# Patient Record
Sex: Female | Born: 1975 | Race: Black or African American | Hispanic: No | Marital: Married | Smoking: Never smoker
Health system: Southern US, Community
[De-identification: ages and names within clinical notes are randomized; demographics above are authoritative.]

## PROBLEM LIST (undated history)

## (undated) DIAGNOSIS — G4730 Sleep apnea, unspecified: Secondary | ICD-10-CM

## (undated) DIAGNOSIS — D649 Anemia, unspecified: Secondary | ICD-10-CM

## (undated) DIAGNOSIS — K5900 Constipation, unspecified: Secondary | ICD-10-CM

## (undated) DIAGNOSIS — Z5189 Encounter for other specified aftercare: Secondary | ICD-10-CM

## (undated) DIAGNOSIS — F329 Major depressive disorder, single episode, unspecified: Secondary | ICD-10-CM

## (undated) DIAGNOSIS — E559 Vitamin D deficiency, unspecified: Secondary | ICD-10-CM

## (undated) DIAGNOSIS — G4700 Insomnia, unspecified: Secondary | ICD-10-CM

## (undated) DIAGNOSIS — I10 Essential (primary) hypertension: Secondary | ICD-10-CM

## (undated) DIAGNOSIS — N920 Excessive and frequent menstruation with regular cycle: Secondary | ICD-10-CM

## (undated) DIAGNOSIS — R7303 Prediabetes: Secondary | ICD-10-CM

## (undated) DIAGNOSIS — F32A Depression, unspecified: Secondary | ICD-10-CM

## (undated) DIAGNOSIS — K219 Gastro-esophageal reflux disease without esophagitis: Secondary | ICD-10-CM

## (undated) DIAGNOSIS — R000 Tachycardia, unspecified: Secondary | ICD-10-CM

## (undated) DIAGNOSIS — R12 Heartburn: Secondary | ICD-10-CM

## (undated) DIAGNOSIS — E7800 Pure hypercholesterolemia, unspecified: Secondary | ICD-10-CM

## (undated) DIAGNOSIS — E669 Obesity, unspecified: Secondary | ICD-10-CM

## (undated) DIAGNOSIS — I499 Cardiac arrhythmia, unspecified: Secondary | ICD-10-CM

## (undated) DIAGNOSIS — R5383 Other fatigue: Secondary | ICD-10-CM

## (undated) DIAGNOSIS — M2550 Pain in unspecified joint: Secondary | ICD-10-CM

## (undated) DIAGNOSIS — E049 Nontoxic goiter, unspecified: Secondary | ICD-10-CM

## (undated) HISTORY — DX: Depression, unspecified: F32.A

## (undated) HISTORY — DX: Tachycardia, unspecified: R00.0

## (undated) HISTORY — DX: Gastro-esophageal reflux disease without esophagitis: K21.9

## (undated) HISTORY — DX: Anemia, unspecified: D64.9

## (undated) HISTORY — DX: Constipation, unspecified: K59.00

## (undated) HISTORY — DX: Excessive and frequent menstruation with regular cycle: N92.0

## (undated) HISTORY — DX: Vitamin D deficiency, unspecified: E55.9

## (undated) HISTORY — DX: Obesity, unspecified: E66.9

## (undated) HISTORY — DX: Insomnia, unspecified: G47.00

## (undated) HISTORY — DX: Pure hypercholesterolemia, unspecified: E78.00

## (undated) HISTORY — DX: Nontoxic goiter, unspecified: E04.9

## (undated) HISTORY — DX: Prediabetes: R73.03

## (undated) HISTORY — DX: Heartburn: R12

## (undated) HISTORY — DX: Pain in unspecified joint: M25.50

## (undated) HISTORY — DX: Other fatigue: R53.83

## (undated) HISTORY — DX: Sleep apnea, unspecified: G47.30

## (undated) HISTORY — DX: Encounter for other specified aftercare: Z51.89

## (undated) HISTORY — DX: Major depressive disorder, single episode, unspecified: F32.9

## (undated) HISTORY — DX: Essential (primary) hypertension: I10

---

## 2000-05-14 HISTORY — PX: MYOMECTOMY: SHX85

## 2000-05-14 HISTORY — PX: LEFT OOPHORECTOMY: SHX1961

## 2000-05-31 ENCOUNTER — Other Ambulatory Visit: Admission: RE | Admit: 2000-05-31 | Discharge: 2000-05-31 | Payer: Self-pay | Admitting: Obstetrics and Gynecology

## 2000-10-16 ENCOUNTER — Encounter: Payer: Self-pay | Admitting: Obstetrics and Gynecology

## 2000-10-16 ENCOUNTER — Encounter: Admission: RE | Admit: 2000-10-16 | Discharge: 2000-10-16 | Payer: Self-pay | Admitting: Obstetrics and Gynecology

## 2000-10-24 ENCOUNTER — Inpatient Hospital Stay (HOSPITAL_COMMUNITY): Admission: RE | Admit: 2000-10-24 | Discharge: 2000-10-27 | Payer: Self-pay | Admitting: Obstetrics and Gynecology

## 2000-10-24 ENCOUNTER — Encounter (INDEPENDENT_AMBULATORY_CARE_PROVIDER_SITE_OTHER): Payer: Self-pay | Admitting: Specialist

## 2001-04-11 ENCOUNTER — Other Ambulatory Visit: Admission: RE | Admit: 2001-04-11 | Discharge: 2001-04-11 | Payer: Self-pay | Admitting: Obstetrics and Gynecology

## 2001-09-22 ENCOUNTER — Inpatient Hospital Stay (HOSPITAL_COMMUNITY): Admission: AD | Admit: 2001-09-22 | Discharge: 2001-09-22 | Payer: Self-pay | Admitting: Obstetrics and Gynecology

## 2001-09-23 ENCOUNTER — Inpatient Hospital Stay (HOSPITAL_COMMUNITY): Admission: AD | Admit: 2001-09-23 | Discharge: 2001-09-23 | Payer: Self-pay | Admitting: Obstetrics and Gynecology

## 2001-11-21 ENCOUNTER — Inpatient Hospital Stay (HOSPITAL_COMMUNITY): Admission: AD | Admit: 2001-11-21 | Discharge: 2001-11-26 | Payer: Self-pay | Admitting: Obstetrics and Gynecology

## 2001-11-21 ENCOUNTER — Encounter (INDEPENDENT_AMBULATORY_CARE_PROVIDER_SITE_OTHER): Payer: Self-pay | Admitting: *Deleted

## 2001-11-27 ENCOUNTER — Encounter: Admission: RE | Admit: 2001-11-27 | Discharge: 2001-12-27 | Payer: Self-pay | Admitting: Obstetrics and Gynecology

## 2001-12-19 ENCOUNTER — Other Ambulatory Visit: Admission: RE | Admit: 2001-12-19 | Discharge: 2001-12-19 | Payer: Self-pay | Admitting: Obstetrics and Gynecology

## 2001-12-28 ENCOUNTER — Encounter: Admission: RE | Admit: 2001-12-28 | Discharge: 2002-01-27 | Payer: Self-pay | Admitting: Obstetrics and Gynecology

## 2002-01-28 ENCOUNTER — Encounter: Admission: RE | Admit: 2002-01-28 | Discharge: 2002-02-27 | Payer: Self-pay | Admitting: Obstetrics and Gynecology

## 2002-09-29 ENCOUNTER — Encounter: Admission: RE | Admit: 2002-09-29 | Discharge: 2002-09-29 | Payer: Self-pay | Admitting: Internal Medicine

## 2002-12-14 ENCOUNTER — Encounter: Admission: RE | Admit: 2002-12-14 | Discharge: 2002-12-14 | Payer: Self-pay | Admitting: Internal Medicine

## 2003-04-13 ENCOUNTER — Encounter: Admission: RE | Admit: 2003-04-13 | Discharge: 2003-04-13 | Payer: Self-pay | Admitting: Internal Medicine

## 2003-04-22 ENCOUNTER — Other Ambulatory Visit: Admission: RE | Admit: 2003-04-22 | Discharge: 2003-04-22 | Payer: Self-pay | Admitting: Obstetrics and Gynecology

## 2003-06-14 ENCOUNTER — Encounter: Admission: RE | Admit: 2003-06-14 | Discharge: 2003-06-14 | Payer: Self-pay | Admitting: Internal Medicine

## 2003-07-16 ENCOUNTER — Encounter: Admission: RE | Admit: 2003-07-16 | Discharge: 2003-07-16 | Payer: Self-pay | Admitting: Internal Medicine

## 2003-12-07 ENCOUNTER — Ambulatory Visit: Admission: RE | Admit: 2003-12-07 | Discharge: 2003-12-07 | Payer: Self-pay | Admitting: Internal Medicine

## 2004-05-12 ENCOUNTER — Ambulatory Visit: Payer: Self-pay | Admitting: Internal Medicine

## 2004-05-14 DIAGNOSIS — E049 Nontoxic goiter, unspecified: Secondary | ICD-10-CM

## 2004-05-14 HISTORY — DX: Nontoxic goiter, unspecified: E04.9

## 2004-05-19 ENCOUNTER — Ambulatory Visit: Payer: Self-pay | Admitting: Internal Medicine

## 2004-05-22 ENCOUNTER — Ambulatory Visit: Payer: Self-pay | Admitting: Internal Medicine

## 2004-05-29 ENCOUNTER — Ambulatory Visit: Payer: Self-pay | Admitting: Internal Medicine

## 2004-09-07 ENCOUNTER — Encounter (INDEPENDENT_AMBULATORY_CARE_PROVIDER_SITE_OTHER): Payer: Self-pay | Admitting: Internal Medicine

## 2004-10-30 ENCOUNTER — Ambulatory Visit: Payer: Self-pay | Admitting: Internal Medicine

## 2005-01-25 ENCOUNTER — Inpatient Hospital Stay (HOSPITAL_COMMUNITY): Admission: AD | Admit: 2005-01-25 | Discharge: 2005-01-27 | Payer: Self-pay | Admitting: Obstetrics and Gynecology

## 2005-02-21 ENCOUNTER — Encounter: Admission: RE | Admit: 2005-02-21 | Discharge: 2005-02-21 | Payer: Self-pay | Admitting: Obstetrics and Gynecology

## 2006-03-19 ENCOUNTER — Ambulatory Visit: Payer: Self-pay | Admitting: Internal Medicine

## 2006-03-19 LAB — CONVERTED CEMR LAB
ALT: 17 units/L (ref 0–35)
AST: 17 units/L (ref 0–37)
Albumin: 4.4 g/dL (ref 3.5–5.2)
Alkaline Phosphatase: 86 units/L (ref 39–117)
BUN: 9 mg/dL (ref 6–23)
CO2: 24 meq/L (ref 19–32)
Calcium: 8.8 mg/dL (ref 8.4–10.5)
Chloride: 108 meq/L (ref 96–112)
Creatinine, Ser: 0.68 mg/dL (ref 0.40–1.20)
Glucose, Bld: 87 mg/dL (ref 70–99)
HCT: 41.1 % (ref 34.4–43.3)
Hemoglobin: 13.8 g/dL (ref 11.7–14.8)
Leukocyte count, blood: 6.3 10*9/L (ref 3.7–10.0)
MCHC: 33.6 g/dL (ref 33.1–35.4)
MCV: 84 fL (ref 78.8–100.0)
Platelets: 241 10*3/uL (ref 152–374)
Potassium: 3.9 meq/L (ref 3.5–5.3)
RBC: 4.89 M/uL (ref 3.79–4.96)
RDW: 13 % (ref 11.5–15.3)
Sodium: 139 meq/L (ref 135–145)
TSH: 0.876 microintl units/mL (ref 0.350–5.50)
Total Bilirubin: 0.3 mg/dL (ref 0.3–1.2)
Total Protein: 7.4 g/dL (ref 6.0–8.3)

## 2006-04-29 DIAGNOSIS — K219 Gastro-esophageal reflux disease without esophagitis: Secondary | ICD-10-CM | POA: Insufficient documentation

## 2006-04-29 DIAGNOSIS — F3289 Other specified depressive episodes: Secondary | ICD-10-CM | POA: Insufficient documentation

## 2006-04-29 DIAGNOSIS — F329 Major depressive disorder, single episode, unspecified: Secondary | ICD-10-CM | POA: Insufficient documentation

## 2006-05-26 DIAGNOSIS — D259 Leiomyoma of uterus, unspecified: Secondary | ICD-10-CM | POA: Insufficient documentation

## 2006-08-08 ENCOUNTER — Telehealth: Payer: Self-pay | Admitting: *Deleted

## 2006-10-01 ENCOUNTER — Ambulatory Visit: Payer: Self-pay | Admitting: Internal Medicine

## 2006-10-01 DIAGNOSIS — R0789 Other chest pain: Secondary | ICD-10-CM | POA: Insufficient documentation

## 2007-04-01 ENCOUNTER — Ambulatory Visit: Payer: Self-pay | Admitting: Internal Medicine

## 2007-04-01 DIAGNOSIS — G4700 Insomnia, unspecified: Secondary | ICD-10-CM | POA: Insufficient documentation

## 2007-08-27 ENCOUNTER — Ambulatory Visit: Payer: Self-pay | Admitting: Internal Medicine

## 2007-08-27 ENCOUNTER — Encounter (INDEPENDENT_AMBULATORY_CARE_PROVIDER_SITE_OTHER): Payer: Self-pay | Admitting: *Deleted

## 2007-08-27 LAB — CONVERTED CEMR LAB: Streptococcus, Group A Screen (Direct): POSITIVE — AB

## 2008-04-26 ENCOUNTER — Encounter (INDEPENDENT_AMBULATORY_CARE_PROVIDER_SITE_OTHER): Payer: Self-pay | Admitting: Internal Medicine

## 2008-07-05 ENCOUNTER — Ambulatory Visit: Payer: Self-pay | Admitting: Sports Medicine

## 2008-12-20 ENCOUNTER — Ambulatory Visit: Payer: Self-pay | Admitting: Internal Medicine

## 2008-12-20 LAB — CONVERTED CEMR LAB: Streptococcus, Group A Screen (Direct): POSITIVE — AB

## 2008-12-24 ENCOUNTER — Encounter (INDEPENDENT_AMBULATORY_CARE_PROVIDER_SITE_OTHER): Payer: Self-pay | Admitting: Internal Medicine

## 2008-12-24 ENCOUNTER — Ambulatory Visit: Payer: Self-pay | Admitting: Internal Medicine

## 2008-12-24 LAB — CONVERTED CEMR LAB
ALT: 19 units/L (ref 0–35)
AST: 21 units/L (ref 0–37)
Albumin: 3.5 g/dL (ref 3.5–5.2)
Alkaline Phosphatase: 46 units/L (ref 39–117)
BUN: 10 mg/dL (ref 6–23)
CO2: 24 meq/L (ref 19–32)
Calcium: 9.4 mg/dL (ref 8.4–10.5)
Chloride: 109 meq/L (ref 96–112)
Creatinine, Ser: 0.67 mg/dL (ref 0.40–1.20)
Glucose, Bld: 103 mg/dL — ABNORMAL HIGH (ref 70–99)
HCT: 41 % (ref 36.0–46.0)
Hemoglobin: 13.8 g/dL (ref 12.0–15.0)
MCHC: 33.7 g/dL (ref 30.0–36.0)
MCV: 88.1 fL (ref >=78.0)
Platelets: 252 10*3/uL (ref 150–400)
Potassium: 3.8 meq/L (ref 3.5–5.3)
RBC: 4.66 M/uL (ref 3.87–5.11)
RDW: 12.4 % (ref 11.5–15.5)
Sodium: 141 meq/L (ref 135–145)
TSH: 0.431 microintl units/mL (ref 0.350–4.5)
Total Bilirubin: 0.6 mg/dL (ref 0.3–1.2)
Total Protein: 6.9 g/dL (ref 6.0–8.3)
WBC: 5.6 10*3/uL (ref 4.0–10.5)

## 2008-12-27 ENCOUNTER — Encounter (INDEPENDENT_AMBULATORY_CARE_PROVIDER_SITE_OTHER): Payer: Self-pay | Admitting: Internal Medicine

## 2009-01-12 ENCOUNTER — Encounter (INDEPENDENT_AMBULATORY_CARE_PROVIDER_SITE_OTHER): Payer: Self-pay | Admitting: Internal Medicine

## 2009-08-02 ENCOUNTER — Encounter (INDEPENDENT_AMBULATORY_CARE_PROVIDER_SITE_OTHER): Payer: Self-pay | Admitting: Internal Medicine

## 2009-08-02 ENCOUNTER — Ambulatory Visit: Payer: Self-pay | Admitting: Internal Medicine

## 2009-08-02 LAB — CONVERTED CEMR LAB: Streptococcus, Group A Screen (Direct): NEGATIVE

## 2009-12-02 ENCOUNTER — Ambulatory Visit: Payer: Self-pay | Admitting: Family Medicine

## 2010-06-13 NOTE — Assessment & Plan Note (Signed)
Summary: 8:30,SHOULDER PAIN,MC   Vital Signs:  Patient profile:   35 year old female Weight:      161.50 pounds Pulse rate:   88 / minute BP sitting:   132 / 86  (right arm)  Vitals Entered By: Terese Door (December 02, 2009 8:28 AM) CC: right shoulder pain x 6 months Pain Assessment Patient in pain? yes     Location: shoulder Intensity: 3   CC:  right shoulder pain x 6 months.  History of Present Illness: 35 yo female with c/c of right shoulder pain.  Pain started about 6 mo ago, no injury or known trauma.  Pt endorses popping and clicking with overhead movement.  Pain is sharp and intermittent with activity.  No radiation, no numbness, no tingling, no weakness.  No recent change in activites.   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    Tried Ibuprofen with some relief.   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 Take 1/2 - 1 Tab By Mouth At Bedtime As Needed For Insomnia. 2)  Fluconazole 150 Mg Tabs (Fluconazole) .... Take 1 Tablet By Mouth Once A Day For 2 Days. 3)  Nexium 20 Mg Cpdr (Esomeprazole Magnesium) .... Take 1 Tablet By Mouth Once A Day  Allergies (verified): No Known Drug Allergies  Past History:  Past Medical History: Last updated: 08/27/2007 DEPRESSION GERD PLANTAR FASCIITIS (Dr. Charlsie Merles) LEIOMYOMA, UTERUS (ICD-218.9) - s/p myomectomy  OOPHORECTOMY, RIGHT, HX OF (ICD-V45.77) Strep pharyngitis  Past Surgical  History: Last updated: 04/29/2006 Caesarean section - Dr. Cherly Hensen (2003) Right Oophorectomy Myomectomy & R. oophorectomy - Dr. Cherly Hensen (2002)  Family History: Last updated: 10-17-06 Father: Deceased. Alcoholic Mother: Alive. Sever depressive episodes involving hospitalization for suicidal ideation. 1st episode  ~ age 6 Siblings: 1 brother with schizophrenia               1 brother with problems of substance abuse               1 sister with Stickler's syndrome Paternal uncles: undiagnosed mental illness (per patient)  Social History: Last updated: 17-Oct-2006 Marital Status: Married Children: 2 Occupation: CMA Never Smoked Alcohol use-yes (occasional) Drug use-no  Risk Factors: Smoking Status: never (10/17/2006)   Shoulder/Elbow Exam  General:    VS reviewed, Well-developed, well-nourished, normal body habitus; no deformities  Skin:    Intact, no scars or bruising.    Inspection:    Inspection is normal.    Vascular:    Radial, ulnar pulses 2+ and symmetric;  no evidence of ischemia or cyanosis.    Sensory:    Gross sensation intact in the upper extremities.    Shoulder Exam:    Right:    Inspection:  Normal    Palpation:  Abnormal       Location:  right AC joint    Stability:  stable    Tenderness:  right AC joint  Swelling:  no    Erythema:  no    Range of Motion:       Flexion-Active: 180       Extension-Active: 45       Flexion-Passive: 180       Extension-Passive: 45       External Rotation : 45       Interior Rotation : T7    Left:    Inspection:  Normal    Palpation:  Normal    Stability:  stable    Tenderness:  no    Swelling:  no    Erythema:  no    Range of Motion:       Flexion-Active: 180       Extension-Active: 45       Flexion-Passive: 180       Extension-Passive: 45       External Rotation : 45       Interior Rotation : T7    Pain with right cross-over and pain with empty can test on R  Tinel's:    Tinel's negative over   pronator, carpal, and Guyon's area.    Impingement Sign NEER:    Right negative; Left negative Impingement Sign HAWKINS:    Right negative; Left negative Apprehension Sign:    Right negative; Left negative Yerguson:    Right negative; Left negative Speeds:    Right negative; Left negative AC joint Adduction Test:    Right negative; Left negative   Impression & Recommendations:  Problem # 1:  ROTATOR CUFF SYNDROME, RIGHT (ICD-726.10) Assessment New No weakness noted, only pain. Discussed options and she chose HEP.  Exercises demonstarted and pt provided with instructions and thera band.  To complete 3 sets daily x 4 weeks. Ice as needed Ibuprofen as needed RTC in 4 weeks If worsening or no improvement may consider steroid injection, formal PT at next visit  Complete Medication List: 1)  Ambien 10 Mg Tabs (Zolpidem tartrate) .... Take 1/2 - 1 tab by mouth at bedtime as needed for insomnia. 2)  Fluconazole 150 Mg Tabs (Fluconazole) .... Take 1 tablet by mouth once a day for 2 days. 3)  Nexium 20 Mg Cpdr (Esomeprazole magnesium) .... Take 1 tablet by mouth once a day

## 2010-09-29 NOTE — Discharge Summary (Signed)
NAME:  Joy Contreras, Joy Contreras                      ACCOUNT NO.:  [id number]   MEDICAL RECORD NO.:  [id number]                   PATIENT TYPE:  INP   LOCATION:  9141                                 FACILITY:  [facility]   PHYSICIAN:  Sheronette A. Cherly Hensen, M.D.         DATE OF BIRTH:  June 07, 1975   DATE OF ADMISSION:  11/21/2001  DATE OF DISCHARGE:  11/26/2001                                 DISCHARGE SUMMARY   ADMISSION DIAGNOSES:  Post dates.   DISCHARGE DIAGNOSES:  Post dates, delivered, arrest of dilatation, presumed  chorioamnionitis.   PROCEDURE:  Primary cesarean section.   HISTORY OF PRESENT ILLNESS:  A 35 year old gravida 1, para 0 female at 12  and 3/7 weeks was admitted for induction of labor secondary to post dates.  The patient has history notable for recurrent urinary tract infections  during the pregnancy for which she has been placed on suppressive therapy.  Prenatal course is otherwise unremarkable.  Her blood type is B+.  Rubella  is immune.  Hepatitis B surface antigen is negative.  Group B Strep culture  was negative.   HOSPITAL COURSE:  The patient was admitted.  She was started on Pitocin  based on examination being __________, 80% effaced, -2 vertex presentation.  Pitocin was continued.  The patient had amniotomy at 1 cm, 70%, -2/-1  station.  Clear fluid was noted at the time.  She was contracting every 1-  1/2 to 4 minutes.  The patient had intrauterine pressure catheter placement.  She progressed to 8 cm, 100%, -1.  During her labor she developed a  temperature of 99.4 axillary and she was started on ampicillin and  gentamicin for presumed chorioamnionitis.  Her intrauterine pressure  catheter was replaced due to the undulating contractions that were noted.  She will remained unchanged after several hours with inability to further  obtain [place] units above/beyond 150 despite an attempt to increase  Pitocin.  Decision was made to proceed with a primary cesarean  section.  The  patient was taken to the operating room where she underwent a primary  cesarean section.  The findings were that of a large female in the left  occiput posterior presentation, Apgars of 8 and 9.  Placenta was manually  removed.  Normal right tube and ovary.  The left tube was normal.  The left  ovary was surgically absent.  Cord pH was 7.28.  An 8 pound 7 ounce baby.  The pathology on the placenta showed no significant evidence of  inflammation.  Her antibiotics were continued postoperatively.  Gentamicin  had been added while the patient was transferred to the operating room.  She  had antibiotics until she was afebrile for 48 hours.  Her CBC on  postoperative day number one showed a hemoglobin of 11.4, hematocrit 33.4,  white count 13.4.  By postoperative day number four the patient was  tolerating a regular diet.  She had passed flatus.  Incision  was without any  erythema, induration, or exudate.  She was deemed well to be discharged  home.   DISPOSITION:  Home.   CONDITION ON DISCHARGE:  Stable.   DISCHARGE MEDICATIONS:  1. Percocet one to two tablets q.3-4h. p.r.n. pain.  2. Multivitamins one p.o. q.d.    DISCHARGE INSTRUCTIONS:  Nothing per vagina for four to six weeks.  Call if  temperature greater or equal to 100.4.  Call if incisional drainage,  redness, or increased incisional pain, severe abdominal pain, nausea,  vomiting, soaking a regular pad every hour or more frequently.  No heavy  lifting or driving for two weeks.   FOLLOW UP:  Follow-up appointment is at four weeks at [facility].                                               Sheronette A. Cherly Hensen, M.D.    SAC/MEDQ  D:  12/29/2001  T:  12/30/2001  Job:  [id number]

## 2010-09-29 NOTE — Discharge Summary (Signed)
[facility]  Patient:    Joy Contreras, Joy Contreras                 MRN: [id number] Adm. Date:  [id number] Disc. Date: [id number] Attending:  Maxie Better                           Discharge Summary  ADMISSION DIAGNOSES:          1. Dyspareunia.                               2. Uterine fibroid.  DISCHARGE DIAGNOSES:          1. Left ovarian fibroma.                               2. Pedunculated uterine fibroids.  PROCEDURE:                    1. Exploratory laparotomy.                               2. Left oophorectomy.                               3. Myomectomy.  HISTORY OF PRESENT ILLNESS:   This is a 35 year old gravida 0, married black female admitted of myomectomy secondary to dyspareunia and uterine fibroid. The patient was been on oral contraceptive therapy for about 10 years. She has noted increased pain with intercourse for about six months duration with continuing even after intercourse. Ultrasound on May 31, 2000 revealed a large pedunculated posterior right fibroid measuring 12.5 cm. The left ovary was noted to be normal. The right ovary could not be identified due to the fibroid. Normal endometrial stripe.  HOSPITAL COURSE:              The patient was admitted. She was taken to the operating room where she underwent exploratory laparotomy. Findings at the time of surgery was a small uterus with pea-sized pedunculated fibroids, normal right tube and ovary, normal appendix, the left ovarian mass approximately 14 cm which was ultimately removed. Frozen section revealed a left ovarian fibroma, pending final pathology.  The patient had an uncomplicated postoperative course. Her bowels were initially sluggish, however, by postoperative day #3 the patient had a small bowel movement, passing flatus, tolerating a regular diet. She had remained afebrile throughout her hospital course. Her CBC on postoperative day #1 showed a hemoglobin  of 12.6, hematocrit of 37.3, white count of 9.0. On postoperative day #3, her staples were removed and Steri-Strips placed. The incision showed no evidence of erythema, induration, or exudate.  DISPOSITION:                  Home.  CONDITION:                    Stable.  DISCHARGE MEDICATIONS:        1. Motrin 800 mg, #30, one p.o. q.6h. p.r.n.                                  pain.  2. Tylox, #25, one to tablets p.o. q.3-4h.                                  p.r.n. pain.  DISCHARGE INSTRUCTIONS:       Call for temperature greater than or equal to 100.4, nothing per vagina for four to six weeks, no heavy lifting or driving for two weeks, call with increased incisional pain, redness or drainage from the incision site, severe abdominal pain, nausea, or vomiting.  FOLLOWUP APPOINTMENT:         The patient is to follow up in four weeks at [facility].DD:  11/14/00 TD:  11/14/00 Job: [id number]

## 2010-09-29 NOTE — Op Note (Signed)
[facility]  Patient:    Joy Contreras, Joy Contreras                 MRN: [id number] Proc. Date: 10/24/00 Adm. Date:  [id number] Attending:  Maxie Better                           Operative Report  PREOPERATIVE DIAGNOSES:       1. Dyspareunia.                               2. Uterine fibroids.  POSTOPERATIVE DIAGNOSES:      1. Pedunculated fibroid and left ovarian fibroma                                  pending final pathology.                               2. Dyspareunia.  PROCEDURES:                   1. Exploratory laparotomy.                               2. Left oophorectomy.                               3. Myomectomy.  SURGEON:                      Sheronette A. Cherly Hensen, M.D.  ASSISTANT:                    Genia Del, M.D.  ANESTHESIA:                   General.  INDICATIONS:                  This is a 35 year old gravida 0 married black female with a last menstrual period of October 23, 2000 admitted for myomectomy secondary to dyspareunia and uterine fibroid.  The patient had had ultrasound that revealed a 12.5 cm pedunculated fibroid.  She has been on birth control pills for approximately ten years.  The risks and benefits of the procedure were explained to the patient and consent was signed.  The patient was transferred to the operating room.  DESCRIPTION OF PROCEDURE:     Under general anesthesia, the patient was placed in the supine position.  Examination under anesthesia revealed a mass to the level of the umbilicus with a palpable right posterior firm mass.  The patient was thoroughly prepped and draped in the usual fashion.  A #10 French Foley catheter was introduced into the  bladder.  Pneumoboots were in place. Antibiotic prophylaxis was given.  A marking pen was used to outline a Pfannenstiel skin incision.  Marcaine 0.25% was injected along this incision line.  A scalpel was then used to perform a Pfannenstiel skin  incision and carry it down to the rectus fascia.  The rectus fascia was incised in the midline and extended bilaterally.  The rectus fascia was then bluntly and with cautery dissected off the rectus muscles, superior and inferior fascia.  The  rectus muscle was split in the midline.  The parietal peritoneum was entered bluntly and extended.  Exploration of the abdomen revealed normal appendix, normal liver edge, and normal palpable kidneys.  There was in the left aspect of the pelvis a large, irregular, firm mass.  The uterus was low in the pelvis.  Attempt at exteriorizing this mass (presumed pedunculated fibroid) was unsuccessful.  The incision on the skin was extended transversely and, again, attempted exteriorizing this mass was not successful.  At that point, further evaluation of the origin of the mass was performed and, at this point, it was noted that the uterus was a small structure.  The tube and ovary on the right were normal.  There were pea-sized pedunculated posterior fundal fibroids present.  This mass was actually the left ovary.  The left tube was otherwise normal.  When isolating the left utero-ovarian ligament and further delineating the superior aspect of this mass, it was noted that the mass was attached to the peritoneum superiorly.  To facilitate its removal, the left utero-ovarian ligaments were doubly clamped proximally and distally and then cut in the intervening segments.  The pedicles were then suture ligated with 0 Vicryl sutures.  Attention was then turned to the proximal attachment of this mass, which measured about 14 cm.  Metzenbaum scissors were then carefully used to initially do some dissection of the peritoneum off of the mass.  Kelly clamps were then used to clamp across the mass and to severe it from its proximal attachments.  The specimen was then sent as a left ovarian mass for frozen section.  The left pelvic side wall was inspected.  The  left retroperitoneal space was then opened and was then dissected to try to isolate the left ureter, which was subsequently identified after much attempt and was noted to be deep in the pelvis and peristalsing.  The proximal peritoneal areas were then closed with 0 Vicryl sutures.  Continued bleeding in that area necessitated additional suturing, with subsequent good hemostasis noted.  Care was taken to identify if the left ureter was anywhere near this structure, and it appeared not to have been that case.  Bleeding along the left utero-ovarian pedicle was clamped and suture ligated with 0 Vicryl suture.  Further inspection of this area revealed that there appeared to be still remnants of the left ovary.  This was removed and the pedicle was sutured with 0 Vicryl sutures.  Using the cautery, the fibroids on the posterior fundal area were removed.  One base from which the fibroid was removed continued bleeding despite cauterization.  Therefore, 2-0 Vicryl figure-of-eight suture was then used for hemostasis. The abdomen was then irrigated.  The posterior cul-de-sac was inspected.  No endometriotic implants were seen.  Reinspection of the areas from which the left ovarian mass had arisen was noted to be good hemostasis.  Irrigation and suction of debris was then obtained.  The parietal peritoneum was not closed.  The undersurface of the rectus fascia was inspected.  Small bleeders were cauterized.  The rectus muscle was inspected. No bleeding was noted.  The rectus fascia was then closed with 0 Vicryl sutures x 2.  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 Sponge and instrument counts x 2 were correct.  COMPLICATIONS:                None.  INTRAOPERATIVE FLUID:         2 L of crystalloid.  URINE OUTPUT:  300 cc of clear yellow urine.  ESTIMATED BLOOD LOSS:         250 cc.  SPECIMEN:                     Left ovary.  Fibroids x 2.  DISPOSITION:                   The patient tolerated the procedure well and was transferred to the recovery room in stable condition. DD:  10/24/00 TD:  10/25/00 Job: [id number]

## 2010-09-29 NOTE — H&P (Signed)
[facility]  Patient:    Joy Contreras, Joy Contreras                     MRN: [id number] Attending:  Nena Jordan A. Cherly Hensen, M.D.                         History and Physical  SCHEDULED ADMISSION DATE:     October 24, 2000.  CHIEF COMPLAINT:              Dyspareunia and uterine fibroid.  HISTORY OF PRESENT ILLNESS:   This is a 35 year old gravida 0, married black female, last menstrual period Sep 25, 2000, who is now being admitted for myomectomy secondary to dyspareunia and uterine fibroids. The patient has had regular cycles secondary to oral contraceptive use. She has been on the birth control pills for 10 years. She denies any family history of endometriosis. She has noticed decreased libido and the patient reports good relationship with her husband. The patient has been having pain with intercourse for about six months duration, and the pain will continue even after intercourse. Ultrasound performed on May 31, 2000 showed a large pedunculated posterior right fibroid measuring at its maximum diameter 12.5 cm. Her left ovary could be seen. The right ovary could not be identified due to the fibroid, and a normal endometrial stripe was noted at the time.  PAST MEDICAL HISTORY:  ALLERGIES:                    No known drug allergies.  MEDICINES:                    Ortho-Novum 7/7/7.  PAST MEDICAL HISTORY:         Negative.  PAST SURGICAL HISTORY:        Negative.  GYNECOLOGIC HISTORY:          In 1995, abnormal Pap smear, question HPV. No actual history of genital herpes.  FAMILY HISTORY:               Grandmother hypertension, heart disease. No genital, colon, or breast cancer.  SOCIAL HISTORY:               Married, nonsmoker, Engineer, site at [employer].  REVIEW OF SYSTEMS:            Negative except for the HPI. The patient has a long history of a right breast lump. Evaluation done at the [facility] on October 15, 2000 showed  consistency with a lymph node.  PHYSICAL EXAMINATION:  GENERAL:                      Well-developed, well-nourished black female in no acute distress.  VITAL SIGNS:                  Blood pressure 118/66, weight is 153 pounds.  SKIN:                         No lesions.  HEENT:                        Anicteric sclera, pink conjunctiva, oropharynx negative.  HEART:                        Regular rate and rhythm without murmur.  BREASTS:  Soft, nontender, left no palpable mass; right a pea-sized mass between 10 and 11 oclock; upward nipples without discharge bilaterally.  LUNGS:                        Clear to auscultation.  ABDOMEN:                      Soft. There is a nontender, palpable mass arising to the umbilicus.  PELVIC:                       Vulva showed no lesion. Vagina had no discharge. Cervix was closed, no lesion. Bimanual examination revealed a uterus of about 18-week size, irregular. Adnexa could not be appreciated secondary to enlarged uterus.  IMPRESSION:                   1. Dyspareunia, probably secondary to the                                  uterine fibroid.                               2. Right breast mass which is consistent with                                  right breast lymph node.  PLAN:                         1. Admission.                               2. Antibiotic prophylaxis.                               3. Exploratory laparotomy.                               4. Myomectomy.  Risk and benefit of the procedure have been explained to the patient and her husband including, but not limited to, infection, bleeding, possible need for myomectomy and/or hysterectomy in the future up to about 30% chance, possible need for cesarean section for future deliveries, internal scar tissue which can cause possibly pelvic pain or bowel obstruction, and wound infection. The patient will be off her birth control pills while  recuperating. DD:  10/22/00 TD:  10/22/00 Job: [id number]

## 2010-09-29 NOTE — H&P (Signed)
[facility]  Patient:    Joy Contreras, Joy Contreras Visit Number: [id number] MRN: [id number]          Service Type: OBS Location: 910B 9174 01 Attending Physician:  Maxie Better Dictated by:   Sheria Lang. Cherly Hensen, M.D. Admit Date:  11/21/2001                           History and Physical  CHIEF COMPLAINT:              Induction of labor, post dates.  HISTORY OF PRESENT ILLNESS:   This is a 35 year old gravida 1 para 0 female, unknown LMP, with an EDC of November 18, 2001 by ultrasound done on April 08, 2001 at eight weeks, who is now at 88 and three-sevenths weeks gestation admitted for induction of labor secondary to post dates.  The patient has not had contractions.  She has had good fetal movements.  Her prenatal course has been remarkable for recurrent urinary tract infection for which she subsequently was placed on suppressive therapy.  Her history is notable for cryosurgery in 1993.  On exam on November 19, 2001 she was noted to have a dimple-sized opening of her cervix, 80% effaced, -2, vertex presentation. Ultrasound on November 19, 2001 showed good amniotic fluid index, estimated fetal weight 8 pounds 9 ounces which was at the 71st percentile, grade 3 placenta. Prenatal care is at [facility], primary obstetrician Dr. Maxie Better.  PRENATAL LABORATORY DATA:     Blood type B positive, antibody screen negative. Hemoglobin electrophoresis negative.  RPR nonreactive.  Rubella immune. Hepatitis B surface antigen negative.  HIV test nonreactive.  GC and chlamydia cultures were negative.  Pap was normal in November 2002.  E. coli UTI on November 29, Enterococcus UTI subsequently.  AFP3 test was normal.  Normal anatomic fetal survey on August 05, 2001 at 21.5 weeks.  Group B strep culture was negative.  One-hour GCT was normal.  PAST MEDICAL HISTORY:  ALLERGIES:                    No known drug allergies.  MEDICATIONS:                   Prenatal vitamins and Macrobid.  MEDICAL HISTORY:              Negative.  SURGICAL HISTORY:             Left oophorectomy and myomectomy in June 2002. Cryosurgery in 1993.  FAMILY HISTORY:               Father - diabetes, seizures.  MI and strokes in paternal grandmother and maternal grandmother.  Her sister was born with Stickler, which is a genetic disorder.  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  REVIEW OF SYSTEMS:            Negative except as noted in the history of present illness.  PHYSICAL EXAMINATION:  GENERAL:                      Well-developed, gravid black female in no acute distress.  VITAL SIGNS:                  Blood pressure 104/60, weight 175 pounds, fetal heart rate 142.  SKIN:  Shows no lesions.  HEENT:                        Anicteric sclerae, pink conjunctivae, oropharynx negative.  HEART:                        Regular rate and rhythm without murmur.  LUNGS:                        Clear to auscultation.  BREASTS:                      Soft, nontender, no palpable mass.  ABDOMEN:                      Gravid.  Fundal height 39 cm.  Low transverse scar noted.  PELVIC:                       Dimple, 80%, -2.  EXTREMITIES:                  No edema.  IMPRESSION:                   Post dates.  PLAN:                         1. Admission.                               2. Pitocin induction.                               3. Routine admission labs.                               4. Epidural greater than or equal to 4 cm p.r.n.                                  pain.                               5. Analgesics p.r.n. pain.  NOTE:                         The myomectomy done on June 2002 did not enter the endometrial cavity and therefore is not of concern at this time.Dictated by:   Sheria Lang. Cherly Hensen, M.D. Attending Physician:  Maxie Better DD:  11/20/01 TD:  11/21/01 Job:  [id number]

## 2010-09-29 NOTE — Op Note (Signed)
[facility]  Patient:    Joy Contreras, Joy Contreras Visit Number: [id number] MRN: [id number]          Service Type: OBS Location: 910A 9141 01 Attending Physician:  Maxie Better Dictated by:   Sheria Lang. Cherly Hensen, M.D. Proc. Date: 11/22/01 Admit Date:  11/21/2001                             Operative Report  PREOPERATIVE DIAGNOSES:       1. Arrest of dilatation.                               2. Presumed chorioamnionitis.                               3. Post dates.  POSTOPERATIVE DIAGNOSES:      1. Direct occiput posterior presentation.                               2. Arrest of dilatation.                               3. Presumed chorioamnionitis.                               4. Post dates.  OPERATION:                    Primary cesarean section, Kerr hysterotomy.  SURGEON:                      Sheronette A. Cherly Hensen, M.D.  ANESTHESIA:                   Epidural.  SPECIMEN:                     Placenta, sent to pathology.  ESTIMATED BLOOD LOSS:         600 cc.  INTRAOPERATIVE FLUID:         2 L.  URINE OUTPUT:                 300 cc clear yellow urine.  COMPLICATIONS:                None.  INDICATIONS:                  This is a 35 year old gravida 1, para 0, female at 82+ weeks gestation, admitted on November 21, 2001, for induction of labor secondary to post dates.  Her history is notable for cryosurgery.  She has had an uncomplicated pregnancy.  The patient had Pitocin induction, subsequently had artificial rupture of membranes and dilatation of her cervix, which ultimately progressed to 8 cm and arrested at 8 cm for several hours.  The patient developed a temperature during the course of her labor that was thought to be presumptive of diagnosis of chorioamnionitis, and for which she was started on ampicillin and gentamicin.  Intrauterine pressure has been in place.  The fetal position was felt to be left occiput anterior  presentation. The patient had neither descent or further dilatation, and the decision was therefore made  to proceed with the primary cesarean section.  The risks and benefits of the procedure had been explained to the patient.  Consent was signed.  The patient was transferred to the operating room.  DESCRIPTION OF PROCEDURE:     Under adequate epidural anesthesia, the patient was placed in the supine position with a left lateral tilt.  An indwelling Foley catheter had been in place prior to transfer.  The patient was sterilely prepped and draped in the usual fashion.  Approximately 8 cc of 0.25% Marcaine was injected along the previous low transverse skin incision, and a scalpel was then used to outline the previous scar, carried down to the rectus fascia using Bovie cautery. The rectus fascia was incised in the midline and extended bilaterally using the Bovie cautery and Mayo scissors.  The rectus fascia was then bluntly and with cautery dissected off the rectus muscles in a superior and inferior fashion.  In the dissection of the upper fascial area, the parietal peritoneum was opened and extended.  The bladder peritoneum was then opened and extended bilaterally.  The bladder was then bluntly dissected off the lower uterine segment and displaced on the operative field using a Doyen retractor.  A curvilinear low transverse uterine incision was then made, extended bilaterally using bandage scissors.  Subsequent delivery of a live female infant from the left occiput posterior presentation was accomplished. The baby was bulb-suctioned on the abdomen and the cord was clamped and cut. The baby was transferred to the waiting pediatricians, who assigned Apgars of 8 and 9 at one and five minutes.  Cord pH was obtained and was 7.28.  The placenta was manually removed.  The uterine cavity was cleaned of debris.  The uterine incision was then inspected.  There was a right inferior extension of the  angled area of the uterine incision.  The uterine incision was able to be closed with 0 Monocryl running locked stitch along the entire incision line. A second layer of 0 Monocryl imbricated sutures were then placed.  Good hemostasis was noted.  The abdomen was copiously irrigated, suctioned of debris.  The left tube was normal.  The left ovary was surgically absent.  The right tube and ovary were normal.  The parietal peritoneum and the vesicouterine peritoneum were now closed.  The undersurface of the rectus fascia and the rectus muscle was inspected, no bleeders noted.  The rectus fascia was closed with 0 Vicryl sutures x2.  The skin previous scar was partially removed and the subcutaneous area was irrigated, small bleeders cauterized, and the skin approximated using Ethicon staples.  Weight of the baby was 8 pounds 7 ounces.  Sponge and instrument counts x2 were correct. The patient tolerated the procedure well and was transferred to the recovery room in stable condition. Dictated by:   Sheria Lang. Cherly Hensen, M.D. Attending Physician:  Maxie Better DD:  11/22/01 TD:  11/25/01 Job: [id number]

## 2010-11-08 ENCOUNTER — Ambulatory Visit: Payer: Self-pay | Admitting: Internal Medicine

## 2011-10-15 ENCOUNTER — Encounter: Payer: Self-pay | Admitting: Internal Medicine

## 2011-10-15 DIAGNOSIS — E049 Nontoxic goiter, unspecified: Secondary | ICD-10-CM | POA: Insufficient documentation

## 2011-10-16 ENCOUNTER — Encounter: Payer: Self-pay | Admitting: Internal Medicine

## 2011-10-16 ENCOUNTER — Ambulatory Visit (INDEPENDENT_AMBULATORY_CARE_PROVIDER_SITE_OTHER): Payer: 59 | Admitting: Internal Medicine

## 2011-10-16 VITALS — BP 134/87 | HR 100 | Temp 99.0°F | Ht 63.0 in | Wt 175.8 lb

## 2011-10-16 DIAGNOSIS — R0789 Other chest pain: Secondary | ICD-10-CM

## 2011-10-16 DIAGNOSIS — R000 Tachycardia, unspecified: Secondary | ICD-10-CM | POA: Insufficient documentation

## 2011-10-16 DIAGNOSIS — K219 Gastro-esophageal reflux disease without esophagitis: Secondary | ICD-10-CM

## 2011-10-16 DIAGNOSIS — R5383 Other fatigue: Secondary | ICD-10-CM | POA: Insufficient documentation

## 2011-10-16 DIAGNOSIS — F3289 Other specified depressive episodes: Secondary | ICD-10-CM

## 2011-10-16 DIAGNOSIS — R5381 Other malaise: Secondary | ICD-10-CM

## 2011-10-16 DIAGNOSIS — F329 Major depressive disorder, single episode, unspecified: Secondary | ICD-10-CM

## 2011-10-16 DIAGNOSIS — N920 Excessive and frequent menstruation with regular cycle: Secondary | ICD-10-CM | POA: Insufficient documentation

## 2011-10-16 DIAGNOSIS — E049 Nontoxic goiter, unspecified: Secondary | ICD-10-CM

## 2011-10-16 LAB — COMPLETE METABOLIC PANEL WITH GFR
ALT: 13 U/L (ref 0–35)
Albumin: 3.9 g/dL (ref 3.5–5.2)
BUN: 9 mg/dL (ref 6–23)
CO2: 22 mEq/L (ref 19–32)
Calcium: 8.8 mg/dL (ref 8.4–10.5)
Chloride: 107 mEq/L (ref 96–112)
GFR, Est African American: >89 mL/min
GFR, Est Non African American: >89 mL/min
Glucose, Bld: 87 mg/dL (ref 70–99)
Sodium: 137 mEq/L (ref 135–145)
Total Bilirubin: 0.2 mg/dL — ABNORMAL LOW (ref 0.3–1.2)
Total Protein: 6.9 g/dL (ref 6.0–8.3)

## 2011-10-16 LAB — LIPID PANEL: HDL: 60 mg/dL (ref >39)

## 2011-10-16 LAB — CBC
HCT: 39.3 % (ref 36.0–46.0)
MCV: 83.4 fL (ref 78.0–100.0)
RBC: 4.71 MIL/uL (ref 3.87–5.11)
WBC: 6.1 10*3/uL (ref 4.0–10.5)

## 2011-10-16 LAB — FERRITIN: Ferritin: 6 ng/mL — ABNORMAL LOW (ref 10–291)

## 2011-10-16 MED ORDER — AMITRIPTYLINE HCL 25 MG PO TABS: ORAL_TABLET | ORAL | Status: DC

## 2011-10-16 MED ORDER — ESOMEPRAZOLE MAGNESIUM 20 MG PO CPDR: 20.0000 mg | DELAYED_RELEASE_CAPSULE | Freq: Every day | ORAL | Status: DC

## 2011-10-16 NOTE — Assessment & Plan Note (Signed)
Seeing GYN for W/U and tx. Will check CBC and ferritin.

## 2011-10-16 NOTE — Assessment & Plan Note (Signed)
Had been on Protonix in past and did well. Sxs have recurred. Reflux into throat, esp at night bc of increased PO intake in evenings. Weight has also increased which could increase sxs. Will start Nexium 20 mg QD for 6 weeks and then stop and see if sxs have resolved.

## 2011-10-16 NOTE — Progress Notes (Signed)
  Subjective:    Patient ID: Joy Contreras, female    DOB: 05-14-1976, 36 y.o.   MRN: [id number]  HPI  Please see the A&P for the status of the pt's chronic medical problems.  Review of Systems  Constitutional: Positive for fatigue and unexpected weight change. Negative for activity change and appetite change.  Respiratory: Positive for chest tightness. Negative for cough, choking and shortness of breath.   Cardiovascular: Positive for chest pain and palpitations. Negative for leg swelling.  Gastrointestinal: Negative for nausea, vomiting and diarrhea.  Genitourinary: Positive for menstrual problem.  Musculoskeletal: Positive for myalgias and arthralgias.  Neurological: Negative for dizziness and light-headedness.  Psychiatric/Behavioral: Positive for sleep disturbance and dysphoric mood.       Objective:   Physical Exam  Constitutional: She is oriented to person, place, and time. She appears well-developed and well-nourished. No distress.  HENT:  Head: Normocephalic and atraumatic.  Right Ear: External ear normal.  Left Ear: External ear normal.  Nose: Nose normal.  Eyes: Conjunctivae are normal.  Neck: Normal range of motion. Neck supple. Thyromegaly present.  Cardiovascular: Normal rate, regular rhythm and normal heart sounds.   Pulmonary/Chest: Effort normal and breath sounds normal. No respiratory distress.  Musculoskeletal: She exhibits no edema.  Lymphadenopathy:    She has no cervical adenopathy.  Neurological: She is alert and oriented to person, place, and time.  Skin: Skin is warm and dry. No rash noted. She is not diaphoretic. No erythema. No pallor.  Psychiatric: She has a normal mood and affect. Her behavior is normal. Judgment and thought content normal.          Assessment & Plan:

## 2011-10-16 NOTE — Assessment & Plan Note (Signed)
HR at rest 100-130. Was Rx Metoprolol while pregnant 2/2 increased HR and did well on it. Some palpitations. Occasional stabbing chest pain L upper chest without radiation. Had ECHO is past but not in EPIC. One or two cups of coffee in AM no other caffeine intake. No tobacco. On OCP but no personal or fam hx of VTE. Concerned about long term effects of tachycardia on heart. There is a risk of tachycardia induced cardiomyopathy.   Will start with TSH and if nl, will resume BB since BP has been trending up.

## 2011-10-16 NOTE — Assessment & Plan Note (Signed)
Dr Altheimer's note indicated that at high risk for hyperthyroidism. Only sxs is tachycardia and palpitations. Will check TSH.

## 2011-10-16 NOTE — Assessment & Plan Note (Signed)
See A&P for depression

## 2011-10-16 NOTE — Assessment & Plan Note (Addendum)
Has had depression in past but wants to try to hold off on anti-depressants for now. Depression in past was feeling of not being able to provide for family. That sxs is present now but the overwhelming sxs are fatigue, tiredness, increased food intake in evenings and insomnia.   Insomnia - can't fall asleep until midnight of 1 AM. Can stay asleep. Gets about 5 hours of sleep a night. Occasional nap on weekends but does not sleep in in the mornings. Hard to get out of bed in AM but then OK except when comes home and crashes on couch. Has tried ETOH once or twice and gets asleep but poor quality. Tried cold meds and gets asleep but groggy next day. Tried Ambien but groggy next day. Will try Elavil 25 mg QHS and increase to 50 if needed.   Basic labs to see if other reason for fatigue.

## 2012-01-15 ENCOUNTER — Ambulatory Visit: Payer: 59 | Admitting: Internal Medicine

## 2012-01-23 ENCOUNTER — Other Ambulatory Visit: Payer: Self-pay | Admitting: *Deleted

## 2012-01-23 DIAGNOSIS — R5383 Other fatigue: Secondary | ICD-10-CM

## 2012-01-23 MED ORDER — AMITRIPTYLINE HCL 50 MG PO TABS: 50.0000 mg | ORAL_TABLET | Freq: Every day | ORAL | Status: DC

## 2012-01-23 NOTE — Telephone Encounter (Signed)
Please increase to 50 mg tablets and order a 3 month supply.

## 2012-05-04 ENCOUNTER — Encounter (HOSPITAL_COMMUNITY): Payer: Self-pay

## 2012-05-04 ENCOUNTER — Emergency Department (HOSPITAL_COMMUNITY)
Admission: EM | Admit: 2012-05-04 | Discharge: 2012-05-04 | Disposition: A | Discharge status: Home / Self Care | Payer: 59 | Source: Home / Self Care

## 2012-05-04 DIAGNOSIS — J111 Influenza due to unidentified influenza virus with other respiratory manifestations: Secondary | ICD-10-CM

## 2012-05-04 MED ORDER — HYDROCOD POLST-CHLORPHEN POLST 10-8 MG/5ML PO LQCR: 5.0000 mL | Freq: Two times a day (BID) | ORAL | Status: DC | PRN

## 2012-05-04 MED ORDER — ACETAMINOPHEN 500 MG PO TABS
1000.0000 mg | ORAL_TABLET | Freq: Once | ORAL | Status: AC
  Administered 2012-05-04: 1000 mg via ORAL

## 2012-05-04 MED ORDER — ACETAMINOPHEN 325 MG PO TABS
ORAL_TABLET | ORAL | Status: AC
  Filled 2012-05-04: qty 3

## 2012-05-04 MED ORDER — ONDANSETRON HCL 4 MG PO TABS: 4.0000 mg | ORAL_TABLET | Freq: Four times a day (QID) | ORAL | Status: DC

## 2012-05-04 NOTE — ED Notes (Signed)
C/o not felt well past few days w HA, fatigue, cough, ST; had flu shot

## 2012-05-04 NOTE — ED Provider Notes (Signed)
History     CSN: [id number]  Arrival date & time 05/04/12  1135   None     Chief Complaint  Patient presents with  . Fever    (Consider location/radiation/quality/duration/timing/severity/associated sxs/prior treatment) HPI Comments: 36 year old female who presents with 2 days of cough, fatigue, sore throat, headache, nausea without vomiting and myalgias and malaise. She received a flu shot in October. She did not realize she had a fever of 102.6 until she arrived here. She admits that she has not been drinking fluids over the past 24 hours or more. He feels as though she may be a little dehydrated.    Past Medical History  Diagnosis Date  . Goiter 2006    Dr Altheimer. Dx with Autonomously functioning goiter during pregnancy. anti-TPO negative.   . Depression     History of Depression  . Menorrhagia     Being tx by GYN  . GERD (gastroesophageal reflux disease)     Tx with PPI  . Tachycardia     Required BB during pregnancy. Baseline 100-130 at rest    Past Surgical History  Procedure Date  . 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 Cesarean section 11/22/2001  . 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 Seizures Father   . Hypertension Mother   . Mental illness Brother   . Stickler syndrome Sister   . Bipolar disorder Mother   . Schizophrenia Brother   . ADD / ADHD Son     History  Substance Use Topics  . Smoking status: Never Smoker   . Smokeless tobacco: Not on file  . Alcohol Use: No    OB History    Grav Para Term Preterm Abortions TAB SAB Ect Mult Living                  Review of Systems  Constitutional: Positive for fever, activity change, appetite change and fatigue.  HENT:       See history of present illness  Respiratory: Positive for cough. Negative for shortness of breath and stridor.   Cardiovascular: Positive for palpitations.  Gastrointestinal:  Positive for nausea. Negative for vomiting.  Genitourinary: Negative.   Musculoskeletal: Negative.   Skin: Negative.   Neurological: Negative.   Psychiatric/Behavioral: Negative.     Allergies  Review of patient's allergies indicates no known allergies.  Home Medications   Current Outpatient Rx  Name  Route  Sig  Dispense  Refill  . AMITRIPTYLINE HCL 50 MG PO TABS   Oral   Take 1 tablet (50 mg total) by mouth at bedtime.   90 tablet   3   . HYDROCOD POLST-CPM POLST ER 10-8 MG/5ML PO LQCR   Oral   Take 5 mLs by mouth every 12 (twelve) hours as needed.   90 mL   0   . ESOMEPRAZOLE MAGNESIUM 20 MG PO CPDR   Oral   Take 1 capsule (20 mg total) by mouth daily.   90 capsule   3   . ORTHO-NOVUM 7/7/7 (28) PO   Oral   Take 1 tablet by mouth daily.         Marland Kitchen ONDANSETRON HCL 4 MG PO TABS   Oral   Take 1 tablet (4 mg total) by mouth every 6 (six) hours.   12 tablet   0     BP 141/90  Pulse 140  Temp 102.6 F (39.2 C) (Oral)  Resp 20  SpO2 100%  Physical Exam  Nursing note and vitals reviewed. Constitutional: She appears well-developed and well-nourished. No distress.       She is sitting up the edge of the exam table, conversing well with energetic speech. Cognition is intact. She appears mildly ill but not toxic.  HENT:       Bilateral TMs are without erythema, bulging. Oropharynx with minor posterior pharyngeal injection but no exudates or swelling.  Eyes: Conjunctivae normal and EOM are normal.  Neck: Normal range of motion. Neck supple.  Cardiovascular:       Tachycardia but no abnormal heart sounds.  Pulmonary/Chest: Effort normal and breath sounds normal. No respiratory distress. She has no wheezes. She has no rales.  Musculoskeletal: Normal range of motion. She exhibits no edema.  Lymphadenopathy:    She has no cervical adenopathy.  Neurological: She is alert. No cranial nerve deficit. She exhibits normal muscle tone.  Skin: Skin is warm and dry. No  rash noted.  Psychiatric: She has a normal mood and affect.    ED Course  Procedures (including critical care time)   Labs Reviewed  POCT RAPID STREP A ([facility] ONLY)   No results found.   1. Influenza-like illness       MDM   Results for orders placed during the hospital encounter of 05/04/12  POCT RAPID STREP A ([facility] ONLY)      Component Value Range   Streptococcus, Group A Screen (Direct) NEGATIVE  NEGATIVE   Although she does appear mildly ill she does not appear toxic. Zofran 4 mg by mouth every 6 hours when necessary nausea. Drink plenty of fluids and stay well hydrated Tussionex 1 teaspoon every 8 hours when necessary cough headache and myalgias. Stay home for the next 3 days, rest or for any worsening new symptoms or problems go to the emergency department. Her heart rate is noted at 140. I suspect that she is tachycardic due to fever and mild dehydration. She is encouraged to drink plenty of fluids and take ibuprofen every 6 hours and/or Tylenol every 4 hours for fever management. Should she fail to defervesce or develop chest pain, shortness of breath or other symptoms as stated above to go to the emergency department. She has no history of cardiac problems.          Hayden Rasmussen, NP 05/04/12 1404

## 2012-05-04 NOTE — ED Provider Notes (Signed)
Medical screening examination/treatment/procedure(s) were performed by non-physician practitioner and as supervising physician I was immediately available for consultation/collaboration.  Leslee Home, M.D.   Reuben Likes, MD 05/04/12 2017

## 2012-06-20 ENCOUNTER — Other Ambulatory Visit: Payer: 59

## 2012-06-20 ENCOUNTER — Other Ambulatory Visit: Payer: Self-pay | Admitting: Internal Medicine

## 2012-06-20 DIAGNOSIS — B950 Streptococcus, group A, as the cause of diseases classified elsewhere: Secondary | ICD-10-CM

## 2012-06-20 DIAGNOSIS — R070 Pain in throat: Secondary | ICD-10-CM

## 2012-06-20 LAB — RAPID STREP SCREEN (MED CTR MEBANE ONLY)

## 2012-06-20 MED ORDER — PENICILLIN V POTASSIUM 500 MG PO TABS: 500.0000 mg | ORAL_TABLET | Freq: Two times a day (BID) | ORAL | Status: DC

## 2012-06-24 ENCOUNTER — Ambulatory Visit: Payer: 59 | Admitting: Internal Medicine

## 2012-06-25 ENCOUNTER — Other Ambulatory Visit: Payer: Self-pay | Admitting: Internal Medicine

## 2012-06-25 DIAGNOSIS — R5383 Other fatigue: Secondary | ICD-10-CM

## 2012-06-25 DIAGNOSIS — R053 Chronic cough: Secondary | ICD-10-CM

## 2012-06-25 DIAGNOSIS — R05 Cough: Secondary | ICD-10-CM

## 2012-06-25 MED ORDER — AMITRIPTYLINE HCL 100 MG PO TABS: 100.0000 mg | ORAL_TABLET | Freq: Every day | ORAL | Status: DC

## 2012-06-30 ENCOUNTER — Other Ambulatory Visit: Payer: Self-pay | Admitting: Internal Medicine

## 2012-06-30 ENCOUNTER — Ambulatory Visit (HOSPITAL_COMMUNITY)
Admission: RE | Admit: 2012-06-30 | Discharge: 2012-06-30 | Disposition: A | Discharge status: Home / Self Care | Payer: 59 | Source: Ambulatory Visit | Attending: Internal Medicine | Admitting: Internal Medicine

## 2012-06-30 DIAGNOSIS — R05 Cough: Secondary | ICD-10-CM | POA: Insufficient documentation

## 2012-06-30 DIAGNOSIS — R053 Chronic cough: Secondary | ICD-10-CM

## 2012-06-30 DIAGNOSIS — R059 Cough, unspecified: Secondary | ICD-10-CM | POA: Insufficient documentation

## 2012-06-30 IMAGING — CR DG CHEST 2V
2 series · 2 of 2 positions shown · non-contrast
Comparison: None

CLINICAL DATA: Chronic cough

CHEST - 2 VIEW

[w chest pa]
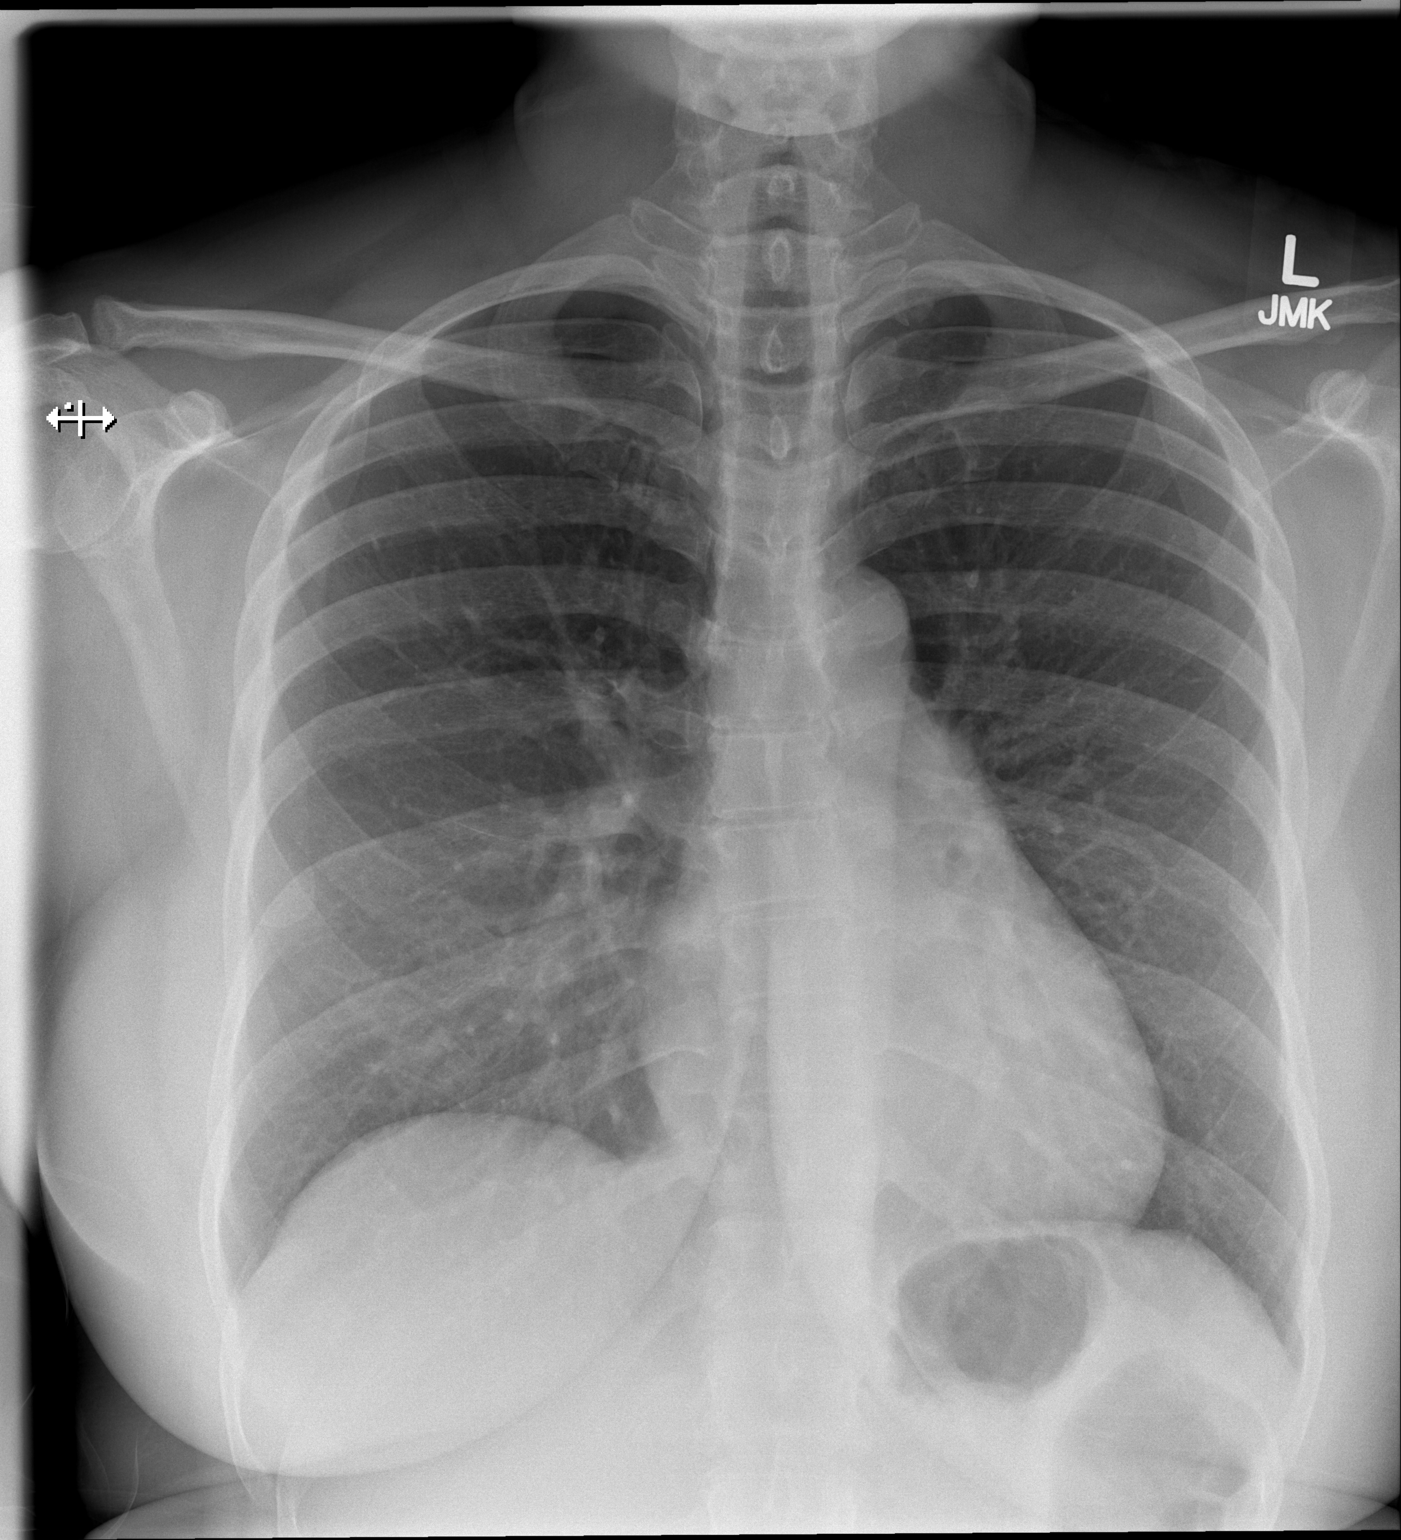

[w chest lat]
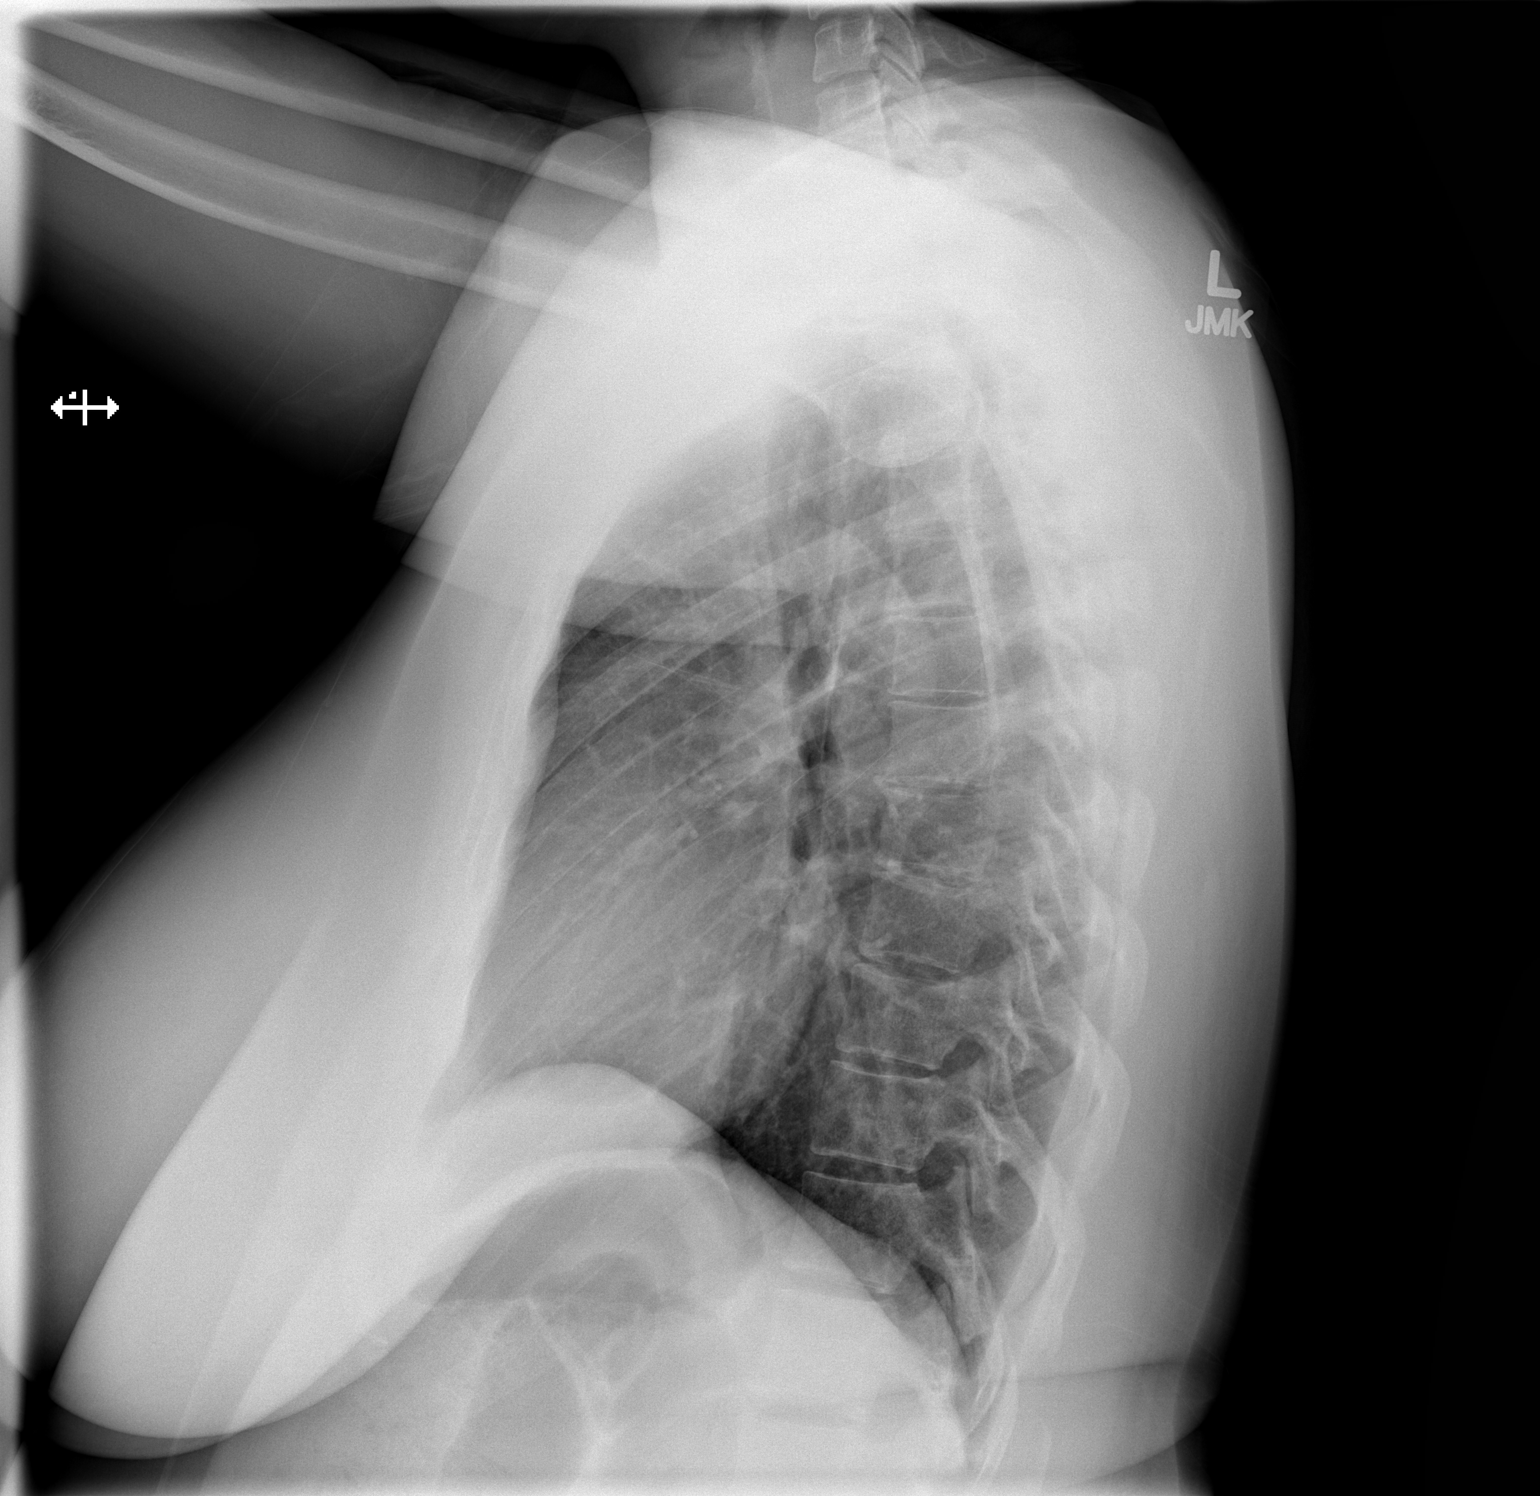

[2 of 2 positions shown; findings below may reference images not displayed]

FINDINGS: Heart size is normal and the vascularity is normal.
Lungs are clear without infiltrate or effusion.  Negative for mass
lesion.
IMPRESSION: Negative

## 2012-06-30 MED ORDER — METOPROLOL SUCCINATE ER 25 MG PO TB24: 25.0000 mg | ORAL_TABLET | Freq: Every day | ORAL | Status: DC

## 2012-06-30 NOTE — Progress Notes (Signed)
Tachy for yrs. On Toprol 25 while pregnant. HR 1teens to 130's. Has D/C'd caffeine. TSH OK. Not anemic. Trial of Toprol 25.  CXR 2/2 cough for 3 months. No GERD / PND.   Trial of increased elavil.

## 2012-08-08 ENCOUNTER — Telehealth: Payer: Self-pay | Admitting: *Deleted

## 2012-08-08 NOTE — Telephone Encounter (Signed)
error 

## 2012-09-03 ENCOUNTER — Other Ambulatory Visit: Payer: Self-pay | Admitting: *Deleted

## 2012-09-03 MED ORDER — METOPROLOL SUCCINATE ER 25 MG PO TB24: 25.0000 mg | ORAL_TABLET | Freq: Every day | ORAL | Status: DC

## 2012-09-03 NOTE — Telephone Encounter (Signed)
Pt request a 90 day supply

## 2012-09-15 ENCOUNTER — Encounter: Payer: Self-pay | Admitting: Internal Medicine

## 2012-10-17 ENCOUNTER — Other Ambulatory Visit (INDEPENDENT_AMBULATORY_CARE_PROVIDER_SITE_OTHER): Payer: 59

## 2012-10-17 ENCOUNTER — Telehealth: Payer: Self-pay | Admitting: *Deleted

## 2012-10-17 DIAGNOSIS — D509 Iron deficiency anemia, unspecified: Secondary | ICD-10-CM

## 2012-10-17 LAB — FERRITIN: Ferritin: 18 ng/mL (ref 10–291)

## 2012-10-17 LAB — CBC
Hemoglobin: 11.8 g/dL — ABNORMAL LOW (ref 12.0–15.0)
MCV: 83.9 fL (ref 78.0–100.0)
RBC: 4.28 MIL/uL (ref 3.87–5.11)

## 2012-10-17 NOTE — Telephone Encounter (Signed)
I have put in the blood draw orders. I will F/U the results next week. I did not sch lab appt though. Thanks

## 2012-10-17 NOTE — Telephone Encounter (Signed)
Pt calls to state that she has been on her menses for about 12 weeks. Her gyn is aware of this and will have her return to the office for a transvaginal ultrasound once her periods stops. In the meantime, she is requesting a ferritin and/or hemoglobin level. Please advise.  Pt # [id number]

## 2012-10-17 NOTE — Telephone Encounter (Signed)
She is scheduled and will have blood drawn today.

## 2012-10-17 NOTE — Addendum Note (Signed)
Addended by: Bufford Spikes on: 10/17/2012 03:05 PM   Modules accepted: Orders

## 2013-02-05 ENCOUNTER — Other Ambulatory Visit: Payer: Self-pay | Admitting: *Deleted

## 2013-02-05 MED ORDER — HYDROCOD POLST-CHLORPHEN POLST 10-8 MG/5ML PO LQCR: 5.0000 mL | Freq: Two times a day (BID) | ORAL | Status: DC | PRN

## 2013-02-05 NOTE — Telephone Encounter (Signed)
Rx called in 

## 2013-02-05 NOTE — Telephone Encounter (Signed)
Patient has had cough for 1 week and request cough med.  OTC meds not helping.   Denies fever, cough is non productive.

## 2013-03-03 ENCOUNTER — Ambulatory Visit (INDEPENDENT_AMBULATORY_CARE_PROVIDER_SITE_OTHER): Payer: 59 | Admitting: Internal Medicine

## 2013-03-03 ENCOUNTER — Encounter: Payer: Self-pay | Admitting: Internal Medicine

## 2013-03-03 VITALS — BP 140/90 | HR 108 | Temp 98.2°F | Ht 64.5 in | Wt 185.4 lb

## 2013-03-03 DIAGNOSIS — R5383 Other fatigue: Secondary | ICD-10-CM

## 2013-03-03 DIAGNOSIS — R059 Cough, unspecified: Secondary | ICD-10-CM

## 2013-03-03 DIAGNOSIS — R05 Cough: Secondary | ICD-10-CM | POA: Insufficient documentation

## 2013-03-03 DIAGNOSIS — E049 Nontoxic goiter, unspecified: Secondary | ICD-10-CM

## 2013-03-03 DIAGNOSIS — K219 Gastro-esophageal reflux disease without esophagitis: Secondary | ICD-10-CM

## 2013-03-03 DIAGNOSIS — R000 Tachycardia, unspecified: Secondary | ICD-10-CM

## 2013-03-03 DIAGNOSIS — N920 Excessive and frequent menstruation with regular cycle: Secondary | ICD-10-CM

## 2013-03-03 DIAGNOSIS — R5381 Other malaise: Secondary | ICD-10-CM

## 2013-03-03 MED ORDER — METOPROLOL SUCCINATE ER 50 MG PO TB24: 50.0000 mg | ORAL_TABLET | Freq: Every day | ORAL | Status: DC

## 2013-03-03 MED ORDER — AMITRIPTYLINE HCL 100 MG PO TABS: 100.0000 mg | ORAL_TABLET | Freq: Every day | ORAL | Status: DC

## 2013-03-03 MED ORDER — PANTOPRAZOLE SODIUM 20 MG PO TBEC: 20.0000 mg | DELAYED_RELEASE_TABLET | Freq: Every day | ORAL | Status: DC

## 2013-03-03 NOTE — Progress Notes (Signed)
  Subjective:    Patient ID: Joy Contreras, female    DOB: 05-24-1975, 37 y.o.   MRN: [id number]  Cough Associated symptoms include a sore throat. Pertinent negatives include no chest pain, fever, rhinorrhea or shortness of breath.    37 yo with c/o non productive nagging cough for about 6 weeks. There were no other sxs representative of viral URI at sx onset. Did have rib cage pain with the coughing but that has resolved. Cough day and night. Walking to car at end of day triggers cough bnut not walking from car into work in AM. Has now developed stuffy nose past week but daughter is also sick. No h/o asthma, no tobacco use or exposure, no fever, no allergies or allergic sxs. No travel. Tried Tussionex and helped but caused constipation. Has tried Mucinex, Nyquil, Dayquil OTC. Had been off PPI 6-7 months but restarted about 2 weeks ago but taking intermittently. No SOB but increased breathing when walks far.   Review of Systems  Constitutional: Negative for fever, activity change, appetite change and unexpected weight change.  HENT: Positive for sore throat. Negative for congestion, rhinorrhea, sinus pressure and trouble swallowing.   Eyes: Negative for itching.  Respiratory: Positive for cough. Negative for shortness of breath.   Cardiovascular: Positive for leg swelling. Negative for chest pain.  Gastrointestinal: Negative for constipation.  Genitourinary: Positive for menstrual problem.  Musculoskeletal: Negative for arthralgias.  Skin: Negative for color change.  Psychiatric/Behavioral: Negative for sleep disturbance.       Objective:   Physical Exam  Constitutional: She is oriented to person, place, and time. She appears well-developed and well-nourished. No distress.  HENT:  Head: Normocephalic and atraumatic.  Right Ear: External ear normal.  Nose: Nose normal.  Post oropharynx erythematous no exudate no tonsillar enlargement  Eyes: Conjunctivae and EOM are normal. Pupils  are equal, round, and reactive to light.  Neck: Normal range of motion. Neck supple. Thyromegaly present.  R lobes, no nodules  Cardiovascular: Regular rhythm and normal heart sounds.   tachy  Pulmonary/Chest: Effort normal and breath sounds normal. No respiratory distress. She has no wheezes.  Musculoskeletal: Normal range of motion. She exhibits edema. She exhibits no tenderness.  Lymphadenopathy:    She has no cervical adenopathy.  Neurological: She is oriented to person, place, and time.  Skin: Skin is warm and dry. No rash noted. She is not diaphoretic. No erythema. No pallor.  Psychiatric: She has a normal mood and affect. Her behavior is normal. Judgment and thought content normal.          Assessment & Plan:

## 2013-03-03 NOTE — Assessment & Plan Note (Signed)
HR still elevated. BP can tolerate increased BB. Purnell Shoemaker asked to tincrease Toprol from 25 to 50 and there is no CI to doing this. Sent in year supply.

## 2013-03-03 NOTE — Assessment & Plan Note (Signed)
Sleeping improved on Elavil 100. Doesn't help to get to sleep but helps to stay asleep. If takes too late at nights gets groggy next AM.

## 2013-03-03 NOTE — Assessment & Plan Note (Signed)
Gyn has rec TAH but wants to wait until next year. Gyn has reordered iron and will start to take once better. HgB has decreased from 11.8 to 10.

## 2013-03-03 NOTE — Patient Instructions (Signed)
I sent in elavil 100, pantoprozole 20, and toprol 50 to the pharmacy Try Benedryl QHS, the pantoprozole, and tussionex (with colace) for cough. Let me know if no better. I will get you your TSH result

## 2013-03-03 NOTE — Assessment & Plan Note (Signed)
TSH nl 10/2011. No sig sxs but still with R lobe enlargement. Check TSH.

## 2013-03-03 NOTE — Assessment & Plan Note (Signed)
Had same thing in Jan Feb 2014 and resolved by itself. No trigger with this episode that started it. CXR in Feb 2014 showed no tracheal impingement by thyroid so will not repeat sine thyroid size unchanged. No h/o asthma and lung exam OK today and no exercise induced sxs consistently so doubt asthma induced cough. GERD and just the persistent cough cycle likely. So, tussionex with colace, benedryl, and PPI. If no better, consider trial of albuterol.

## 2013-03-03 NOTE — Assessment & Plan Note (Signed)
No longer on Nexium. [employer], [employer]. Does get GERD with severe sxs and with specific foods. Change to Protonix.

## 2013-03-04 LAB — TSH: TSH: 0.559 u[IU]/mL (ref 0.350–4.500)

## 2013-03-12 ENCOUNTER — Telehealth: Payer: Self-pay | Admitting: *Deleted

## 2013-03-12 NOTE — Telephone Encounter (Signed)
Pt came into my office asking for an antibiotic for her symptoms.  She has stated  her cough is now productive, and states chest tenderness when she coughs. Denies fever, chills or SOB.  She was seen in clinic 10/21 for cough,cold and fatigue.  She has tried OTC cough med as reviewed at last visit. Onset of cold symptoms 7 weeks ago.  She wants to try antibiotic, she is not requesting a chest X-ray.

## 2013-03-13 MED ORDER — AZITHROMYCIN 250 MG PO TABS: ORAL_TABLET | ORAL | Status: AC

## 2013-03-13 NOTE — Telephone Encounter (Signed)
Pt informed

## 2013-03-13 NOTE — Telephone Encounter (Signed)
Azithro sent in. If copay too $$, pls let me know.

## 2013-03-19 ENCOUNTER — Other Ambulatory Visit: Payer: Self-pay

## 2013-06-04 ENCOUNTER — Ambulatory Visit: Payer: 59 | Admitting: Internal Medicine

## 2013-07-17 ENCOUNTER — Other Ambulatory Visit: Payer: Self-pay | Admitting: Obstetrics and Gynecology

## 2013-07-21 ENCOUNTER — Ambulatory Visit (HOSPITAL_COMMUNITY)
Admission: RE | Admit: 2013-07-21 | Discharge: 2013-07-21 | Disposition: A | Discharge status: Home / Self Care | Payer: 59 | Source: Ambulatory Visit | Attending: Obstetrics and Gynecology | Admitting: Obstetrics and Gynecology

## 2013-07-21 DIAGNOSIS — D509 Iron deficiency anemia, unspecified: Secondary | ICD-10-CM | POA: Insufficient documentation

## 2013-07-21 MED ORDER — FERUMOXYTOL INJECTION 510 MG/17 ML
1020.0000 mg | Freq: Once | INTRAVENOUS | Status: AC
  Administered 2013-07-21: 1020 mg via INTRAVENOUS
  Filled 2013-07-21: qty 34

## 2013-10-14 ENCOUNTER — Encounter (HOSPITAL_COMMUNITY): Admission: RE | Payer: Self-pay | Source: Ambulatory Visit

## 2013-10-14 ENCOUNTER — Ambulatory Visit (HOSPITAL_COMMUNITY): Admission: RE | Admit: 2013-10-14 | Payer: 59 | Source: Ambulatory Visit | Admitting: Obstetrics and Gynecology

## 2013-10-14 SURGERY — ROBOTIC ASSISTED TOTAL HYSTERECTOMY
Anesthesia: General

## 2014-02-16 ENCOUNTER — Encounter: Payer: Self-pay | Admitting: Internal Medicine

## 2014-02-16 ENCOUNTER — Ambulatory Visit (INDEPENDENT_AMBULATORY_CARE_PROVIDER_SITE_OTHER): Payer: 59 | Admitting: Internal Medicine

## 2014-02-16 VITALS — BP 138/88

## 2014-02-16 DIAGNOSIS — H8309 Labyrinthitis, unspecified ear: Secondary | ICD-10-CM

## 2014-02-16 DIAGNOSIS — N921 Excessive and frequent menstruation with irregular cycle: Secondary | ICD-10-CM

## 2014-02-16 DIAGNOSIS — E049 Nontoxic goiter, unspecified: Secondary | ICD-10-CM

## 2014-02-16 LAB — POCT URINALYSIS DIPSTICK
BILIRUBIN UA: NEGATIVE
Glucose, UA: NEGATIVE
KETONES UA: NEGATIVE
Leukocytes, UA: NEGATIVE
Nitrite, UA: NEGATIVE
PH UA: 7
PROTEIN UA: NEGATIVE
SPEC GRAV UA: 1.015
Urobilinogen, UA: 0.2

## 2014-02-16 LAB — COMPLETE METABOLIC PANEL WITH GFR
ALK PHOS: 65 U/L (ref 39–117)
ALT: 18 U/L (ref 0–35)
AST: 25 U/L (ref 0–37)
Albumin: 3.4 g/dL — ABNORMAL LOW (ref 3.5–5.2)
BILIRUBIN TOTAL: 0.2 mg/dL — AB (ref 0.3–1.2)
BUN: 8 mg/dL (ref 6–23)
CO2: 25 meq/L (ref 19–32)
Calcium: 9 mg/dL (ref 8.4–10.5)
Chloride: 107 mEq/L (ref 96–112)
Creat: 0.75 mg/dL (ref 0.50–1.10)
GFR, Est Non African American: >89 mL/min
Glucose, Bld: 81 mg/dL (ref 70–99)
Potassium: 4.6 mEq/L (ref 3.5–5.3)
SODIUM: 144 meq/L (ref 135–145)
TOTAL PROTEIN: 7.4 g/dL (ref 6.0–8.3)

## 2014-02-16 LAB — CBC WITH DIFFERENTIAL/PLATELET
Basophils Absolute: 0.1 10*3/uL (ref 0.0–0.1)
Basophils Relative: 1 % (ref 0–1)
EOS ABS: 0.1 10*3/uL (ref 0.0–0.7)
Eosinophils Relative: 1 % (ref 0–5)
HCT: 39.6 % (ref 36.0–46.0)
Hemoglobin: 13.2 g/dL (ref 12.0–15.0)
LYMPHS ABS: 2.3 10*3/uL (ref 0.7–4.0)
Lymphocytes Relative: 39 % (ref 12–46)
MCH: 29.3 pg (ref 26.0–34.0)
MCHC: 33.3 g/dL (ref 30.0–36.0)
MCV: 87.8 fL (ref 78.0–100.0)
Monocytes Absolute: 0.4 10*3/uL (ref 0.1–1.0)
Monocytes Relative: 7 % (ref 3–12)
NEUTROS PCT: 52 % (ref 43–77)
Neutro Abs: 3.1 10*3/uL (ref 1.7–7.7)
PLATELETS: 315 10*3/uL (ref 150–400)
RBC: 4.51 MIL/uL (ref 3.87–5.11)
RDW: 13.5 % (ref 11.5–15.5)
WBC: 5.9 10*3/uL (ref 4.0–10.5)

## 2014-02-16 LAB — TSH: TSH: 1.118 u[IU]/mL (ref 0.350–4.500)

## 2014-02-16 LAB — POCT URINE PREGNANCY: Preg Test, Ur: NEGATIVE

## 2014-02-16 LAB — FERRITIN: Ferritin: 10 ng/mL (ref 10–291)

## 2014-02-16 LAB — IRON: Iron: 56 ug/dL (ref 42–145)

## 2014-02-16 MED ORDER — MECLIZINE HCL 12.5 MG PO TABS: 12.5000 mg | ORAL_TABLET | Freq: Three times a day (TID) | ORAL | Status: DC | PRN

## 2014-02-16 NOTE — Assessment & Plan Note (Addendum)
Possibly Labyrinthitis given ?recent viral gastroenteritis and nystagmus vs. secondary to chronic irone deficiency anemia and menorrhagia and further compounded by ?dehydration.   Reports worsening dizziness since Sunday associated with one episode of nausea, vomiting, and diarrhea that has since then resolved. Dizziness worse when lying back and bending forward, slightly improved since yesterday.  Horizontal nystagmus on physical exam that spontaneous corrects no physical exam.  Currently menstruating with hx of heavy and prolonged menses. Received IV iron 2 months ago with GYN Dr. Garwin Brothers.   Last cbc with Hb 11.8 10/2012. Last ferritin 18.  -repeat cbc and iron panel today--Hb 13.2 with iron and ferritin level still pending; likely will benefit from repeat IV iron in near future. Did not tolerate oral iron supplementation in the past due to severe constipation. -check cmet given possible dehydration also contributing to symptoms although not orthostatic but has been drinking plenty of fluids--no obvious signs of dehydration, Cr <1 bun 8, albumin 3.4 -recommend following up with GYN, she has been recommended for TAH but has not been able to schedule it yet. Hx of fibroids.  -likely self-limiting but will prescribe low dose prn meclizine for symptomatic relief. If no improvement in symptoms, may consider ENT referral.

## 2014-02-16 NOTE — Assessment & Plan Note (Signed)
Checking TSH today.

## 2014-02-16 NOTE — Patient Instructions (Addendum)
General Instructions:  Dear Joy Contreras,  I hope you feel better soon.   You may try meclizine up to three times a day prn (probably better at night as it may cause drowsiness) to help with your symptoms that should resolve on their own. If they do not get better or worsen, please call us right away.   Take caution when driving and walking. Take your time standing up and sitting down. I would avoid driving and working if taking meclizine and feeling drowsy.   Please bring your medicines with you each time you come to clinic.  Medicines may include prescription medications, over-the-counter medications, herbal remedies, eye drops, vitamins, or other pills.  Care Management & Community Referrals:  No flowsheet data found.    Meclizine tablets or capsules What is this medicine? MECLIZINE (MEK li zeen) is an antihistamine. It is used to prevent nausea, vomiting, or dizziness caused by motion sickness. It is also used to prevent and treat vertigo (extreme dizziness or a feeling that you or your surroundings are tilting or spinning around). This medicine may be used for other purposes; ask your health care provider or pharmacist if you have questions. COMMON BRAND NAME(S): Antivert, Dramamine Less Drowsy, Medivert, Meni-D What should I tell my health care provider before I take this medicine? They need to know if you have any of these conditions: -asthma -glaucoma -prostate trouble -stomach problems -urinary problems -an unusual or allergic reaction to meclizine, other medicines, foods, dyes, or preservatives -pregnant or trying to get pregnant -breast-feeding How should I use this medicine? Take this medicine by mouth with a glass of water. Follow the directions on the prescription label. If you are using this medicine to prevent motion sickness, take the dose at least 1 hour before travel. If it upsets your stomach, take it with food or milk. Take your doses at regular intervals. Do not take  your medicine more often than directed. Talk to your pediatrician regarding the use of this medicine in children. Special care may be needed. Overdosage: If you think you have taken too much of this medicine contact a poison control center or emergency room at once. NOTE: This medicine is only for you. Do not share this medicine with others. What if I miss a dose? If you miss a dose, take it as soon as you can. If it is almost time for your next dose, take only that dose. Do not take double or extra doses. What may interact with this medicine? -barbiturate medicines for inducing sleep or treating seizures -digoxin -medicines for anxiety or sleeping problems, like alprazolam, diazepam or temazepam -medicines for hay fever and other allergies -medicines for mental depression -medicines for movement abnormalities as in Parkinson's disease, or for stomach problems -medicines for pain -medicines that relax muscles This list may not describe all possible interactions. Give your health care provider a list of all the medicines, herbs, non-prescription drugs, or dietary supplements you use. Also tell them if you smoke, drink alcohol, or use illegal drugs. Some items may interact with your medicine. What should I watch for while using this medicine? If you are taking this medicine on a regular schedule, visit your doctor or health care professional for regular checks on your progress. You may get dizzy, drowsy or have blurred vision. Do not drive, use machinery, or do anything that needs mental alertness until you know how this medicine affects you. Do not stand or sit up quickly, especially if you are an older patient. This  reduces the risk of dizzy or fainting spells. Alcohol can increase possible dizziness. Avoid alcoholic drinks. Your mouth may get dry. Chewing sugarless gum or sucking hard candy, and drinking plenty of water may help. Contact your doctor if the problem does not go away or is  severe. This medicine may cause dry eyes and blurred vision. If you wear contact lenses you may feel some discomfort. Lubricating drops may help. See your eye doctor if the problem does not go away or is severe. What side effects may I notice from receiving this medicine? Side effects that you should report to your doctor or health care professional as soon as possible: -fainting spells -fast or irregular heartbeat Side effects that usually do not require medical attention (report to your doctor or health care professional if they continue or are bothersome): -constipation -difficulty passing urine -difficulty sleeping -headache -stomach upset This list may not describe all possible side effects. Call your doctor for medical advice about side effects. You may report side effects to FDA at 1-800-FDA-1088. Where should I keep my medicine? Keep out of the reach of children. Store at room temperature between 15 and 30 degrees C (59 and 86 degrees F). Keep container tightly closed. Throw away any unused medicine after the expiration date. NOTE: This sheet is a summary. It may not cover all possible information. If you have questions about this medicine, talk to your doctor, pharmacist, or health care provider.  2015, Elsevier/Gold Standard. (2007-11-06 10:35:36)  Labyrinthitis (Inner Ear Inflammation) Your exam shows you have an inner ear disturbance or labyrinthitis. The cause of this condition is not known. But it may be due to a virus infection. The symptoms of labyrinthitis include vertigo or dizziness made worse by motion, nausea and vomiting. The onset of labyrinthitis may be very sudden. It usually lasts for a few days and then clears up over 1-2 weeks. The treatment of an inner ear disturbance includes bed rest and medications to reduce dizziness, nausea, and vomiting. You should stay away from alcohol, tranquilizers, caffeine, nicotine, or any medicine your doctor thinks may make your  symptoms worse. Further testing may be needed to evaluate your hearing and balance system. Please see your doctor or go to the emergency room right away if you have:  Increasing vertigo, earache, loss of hearing, or ear drainage.  Headache, blurred vision, trouble walking, fainting, or fever.  Persistent vomiting, dehydration, or extreme weakness. Document Released: 04/30/2005 Document Revised: 07/23/2011 Document Reviewed: 10/16/2006 [facility] Patient Information 2015 [place], [place]. This information is not intended to replace advice given to you by your health care provider. Make sure you discuss any questions you have with your health care provider.

## 2014-02-16 NOTE — Assessment & Plan Note (Signed)
Recommend following up with GYN and scheduling TAH when able to per her schedule.   Hb 13 today. Iron panel pending.

## 2014-02-16 NOTE — Progress Notes (Signed)
INTERNAL MEDICINE TEACHING ATTENDING ADDENDUM - Aldine Contes, MD: I reviewed and discussed at the time of visit with the resident Dr. Eula Fried, the patient's medical history, physical examination, diagnosis and results of pertinent tests and treatment and I agree with the patient's care as documented. - Upon further discussion with resident it is possible that patient has probable mild labrynthitis given fatiguable nystagmus and vertigo - Would consider meclizine prn, fluids as tolerated and possible ENT referral if no improvement

## 2014-02-16 NOTE — Progress Notes (Signed)
Subjective:   Patient ID: Joy Contreras female   DOB: 05-17-1975 38 y.o.   MRN: [id number]  HPI: [name] is a 38 y.o. female with Menorrhagia recommended for TAH (not done yet) presenting to opc today for acute visit. Joy Contreras reports feeling dizzy the past few days, worse when she lays down or bends over. Symptoms started Sunday night when she woke up in the middle of the night feeling hot, nausea, vomiting x1 with diarrhea.  Since then, the nausea, vomiting, and diarrhea has resolved and dizziness is slightly improved today than yesterday. No prior similar episodes. No change in medications or diet (did have dinner at mother in [place] on Sunday but she is the only one she knows who got sick). Denies any cough or cold symptoms, no chest pain or SOB, fever, or chills. She does feel like her heart is racing at times. No headache, change in vision, ear or eye pain. She feels tired but not as bad as before and has a history of heavy and prolonged menses. No syncope. Not orthostatic today, has been drinking water and Gatorade since yesterday.   Menorrhagia--recommended for TAH by GYN for fibroids, but has not been done yet.  Currently menstruating, uses ~3 super tampons per day. Received IV iron 2 months ago with improvement in fatigue symptoms. Denies being pregnant, no sex for at least 2 months, on BCPs--restarted on Sunday, takes 3 weeks at a time with 1 week break.   Of note, noted to have goiter. Recent mild leg swelling past 3-4 months.  Past Medical History  Diagnosis Date  . Goiter 2006    Dr Altheimer. Dx with Autonomously functioning goiter during pregnancy. anti-TPO negative.   . Depression     History of Depression  . Menorrhagia     Being tx by GYN  . GERD (gastroesophageal reflux disease)     Tx with PPI  . Tachycardia     Required BB during pregnancy. Baseline 100-130 at rest   Current Outpatient Prescriptions  Medication Sig Dispense Refill  . amitriptyline  (ELAVIL) 100 MG tablet Take 1 tablet (100 mg total) by mouth at bedtime.  90 tablet  3  . chlorpheniramine-HYDROcodone (TUSSIONEX PENNKINETIC ER) 10-8 MG/5ML LQCR Take 5 mLs by mouth every 12 (twelve) hours as needed.  115 mL  0  . esomeprazole (NEXIUM) 20 MG capsule Take 1 capsule (20 mg total) by mouth daily.  90 capsule  3  . metoprolol succinate (TOPROL-XL) 50 MG 24 hr tablet Take 1 tablet (50 mg total) by mouth daily.  90 tablet  3  . Norethin-Eth Estrad Triphasic (ORTHO-NOVUM 7/7/7, 28, PO) Take 1 tablet by mouth daily.      . pantoprazole (PROTONIX) 20 MG tablet Take 1 tablet (20 mg total) by mouth daily.  90 tablet  3   No current facility-administered medications for this visit.   Family History  Problem Relation Age of Onset  . Seizures Father   . Hypertension Mother   . Mental illness Brother   . Stickler syndrome Sister   . Bipolar disorder Mother   . Schizophrenia Brother   . ADD / ADHD Son    History   Social History  . Marital Status: Married    Spouse Name: N/A    Number of Children: N/A  . Years of Education: N/A   Social History Main Topics  . Smoking status: Never Smoker   . Smokeless tobacco: Not on file  . Alcohol  Use: No  . Drug Use: No  . Sexual Activity: Not on file   Other Topics Concern  . Not on file   Social History Narrative  . No narrative on file   Review of Systems:  Constitutional:  Denies fever, chills  HEENT:  Denies congestion  Respiratory:  Denies SOB   Cardiovascular:  Denies chest pain. Palpitations and leg swelling  Gastrointestinal:  Denies nausea, vomiting. Occasional cramping.  Genitourinary:  Denies dysuria. Currently menstruating.   Musculoskeletal:  Denies myalgias  Skin:  Denies pallor, rash and wound.   Neurological:  Dizziness   Objective:  Physical Exam: Filed Vitals:   02/16/14 1147  BP: 138/88  BP lying 138/82 with HR 94 and 139/88 with HR 93.   Vitals reviewed. General: sitting in chair, NAD HEENT: PERRL,  EOMI, mild conjunctival pallor, no visible erythema or edema in b/l ears, horizontal nystagmus when lying down and reporting dizziness and resolves when symptoms resolve Neck: thyromegaly R slightly greater than left, -tenderness to palpation Cardiac: tachycardia Pulm: clear to auscultation bilaterally, no wheezes, rales, or rhonchi Abd: soft, nontender, nondistended, BS present, +stretch marks Ext: warm and well perfused, trace pitting edema left lower extremity >right, non-tender to palpation Neuro: alert and oriented X3, cranial nerves II-XII grossly intact, strength and sensation to light touch equal in bilateral upper and lower extremities, walking without assistance. Dizziness noted worse when lying back from seated position that resolves after ~15-20 seconds and not as severe when sitting back up. Worse when bending down. No dizziness with full ROM of head and nodding.   Assessment & Plan:  Discussed with Dr. Dareen Piano Dizziness likely secondary to labyrinthitis Iron panel and TSH still pending Follow up with GYN for TAH

## 2014-06-30 ENCOUNTER — Other Ambulatory Visit: Payer: Self-pay | Admitting: *Deleted

## 2014-06-30 DIAGNOSIS — R5383 Other fatigue: Secondary | ICD-10-CM

## 2014-06-30 MED ORDER — METOPROLOL SUCCINATE ER 50 MG PO TB24: 50.0000 mg | ORAL_TABLET | Freq: Every day | ORAL | Status: DC

## 2014-06-30 MED ORDER — AMITRIPTYLINE HCL 100 MG PO TABS
100.0000 mg | ORAL_TABLET | Freq: Every day | ORAL | Status: DC
Start: 2014-06-30 — End: 2015-02-03

## 2014-10-04 ENCOUNTER — Other Ambulatory Visit: Payer: Self-pay | Admitting: Internal Medicine

## 2014-10-04 ENCOUNTER — Encounter: Payer: Self-pay | Admitting: Internal Medicine

## 2014-10-04 DIAGNOSIS — J029 Acute pharyngitis, unspecified: Secondary | ICD-10-CM | POA: Insufficient documentation

## 2014-10-04 DIAGNOSIS — E559 Vitamin D deficiency, unspecified: Secondary | ICD-10-CM | POA: Insufficient documentation

## 2014-10-04 MED ORDER — PENICILLIN V POTASSIUM 500 MG PO TABS: 500.0000 mg | ORAL_TABLET | Freq: Two times a day (BID) | ORAL | Status: DC

## 2014-10-04 NOTE — Progress Notes (Signed)
Pt presents with sore throat, chills, headache, and decreased appetite of 2-day duration with recent sick contact (daughter) who tested positive for GABHS on rapid strep test. She denies fever, nausea, vomiting, cough, or lymphadenopathy. 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 Will prescribe penicillin V potassium 500 mg BID for 10 days for presumed GABHS pharyngitis in setting of symptoms, sick contact, and previous history of GABHS.   Dr. Naaman Plummer

## 2014-10-14 ENCOUNTER — Other Ambulatory Visit: Payer: Self-pay | Admitting: Obstetrics and Gynecology

## 2014-10-26 NOTE — Anesthesia Preprocedure Evaluation (Addendum)
Anesthesia Evaluation  Patient identified by MRN, date of birth, ID band Patient awake    Reviewed: Allergy & Precautions, NPO status , Patient's Chart, lab work & pertinent test results, reviewed documented beta blocker date and time   Airway Mallampati: II   Neck ROM: Full    Dental  (+) Teeth Intact, Dental Advisory Given   Pulmonary neg pulmonary ROS,  breath sounds clear to auscultation        Cardiovascular hypertension, Rhythm:Regular     Neuro/Psych Depression    GI/Hepatic Neg liver ROS, GERD-  Medicated,  Endo/Other    Renal/GU negative Renal ROS     Musculoskeletal   Abdominal (+)  Abdomen: soft.    Peds  Hematology 12/36 3 months ago   Anesthesia Other Findings   Reproductive/Obstetrics                            Anesthesia Physical Anesthesia Plan  ASA: II  Anesthesia Plan: General   Post-op Pain Management:    Induction: Intravenous  Airway Management Planned: Oral ETT  Additional Equipment:   Intra-op Plan:   Post-operative Plan: Extubation in OR  Informed Consent: I have reviewed the patients History and Physical, chart, labs and discussed the procedure including the risks, benefits and alternatives for the proposed anesthesia with the patient or authorized representative who has indicated his/her understanding and acceptance.     Plan Discussed with:   Anesthesia Plan Comments: (Multimodal pain RX, T&C ordered, 2nd IV, arterial line)      Anesthesia Quick Evaluation

## 2014-10-28 ENCOUNTER — Other Ambulatory Visit: Payer: Self-pay

## 2014-10-28 ENCOUNTER — Encounter (HOSPITAL_COMMUNITY)
Admission: RE | Admit: 2014-10-28 | Discharge: 2014-10-28 | Disposition: A | Discharge status: Home / Self Care | Payer: 59 | Source: Ambulatory Visit | Attending: Obstetrics and Gynecology | Admitting: Obstetrics and Gynecology

## 2014-10-28 ENCOUNTER — Encounter (HOSPITAL_COMMUNITY): Payer: Self-pay

## 2014-10-28 DIAGNOSIS — D649 Anemia, unspecified: Secondary | ICD-10-CM | POA: Diagnosis not present

## 2014-10-28 DIAGNOSIS — N920 Excessive and frequent menstruation with regular cycle: Secondary | ICD-10-CM | POA: Insufficient documentation

## 2014-10-28 DIAGNOSIS — D259 Leiomyoma of uterus, unspecified: Secondary | ICD-10-CM | POA: Diagnosis not present

## 2014-10-28 DIAGNOSIS — Z01818 Encounter for other preprocedural examination: Secondary | ICD-10-CM | POA: Insufficient documentation

## 2014-10-28 HISTORY — DX: Cardiac arrhythmia, unspecified: I49.9

## 2014-10-28 LAB — CBC
HCT: 27 % — ABNORMAL LOW (ref 36.0–46.0)
Hemoglobin: 8.1 g/dL — ABNORMAL LOW (ref 12.0–15.0)
MCH: 22.5 pg — AB (ref 26.0–34.0)
MCHC: 30 g/dL (ref 30.0–36.0)
MCV: 75 fL — AB (ref 78.0–100.0)
PLATELETS: 377 10*3/uL (ref 150–400)
RBC: 3.6 MIL/uL — ABNORMAL LOW (ref 3.87–5.11)
RDW: 14.8 % (ref 11.5–15.5)
WBC: 6.9 10*3/uL (ref 4.0–10.5)

## 2014-10-28 LAB — BASIC METABOLIC PANEL
Anion gap: 5 (ref 5–15)
BUN: 9 mg/dL (ref 6–20)
CO2: 23 mmol/L (ref 22–32)
CREATININE: 0.63 mg/dL (ref 0.44–1.00)
Calcium: 8.5 mg/dL — ABNORMAL LOW (ref 8.9–10.3)
Chloride: 107 mmol/L (ref 101–111)
GFR calc Af Amer: >60 mL/min (ref >60)
GLUCOSE: 128 mg/dL — AB (ref 65–99)
Potassium: 3.7 mmol/L (ref 3.5–5.1)
Sodium: 135 mmol/L (ref 135–145)

## 2014-10-28 NOTE — Patient Instructions (Signed)
Your procedure is scheduled on: 11/01/14  Enter through the Main Entrance at :17 am  Pick up desk phone and dial [phone] and inform us of your arrival.  Please call [phone] if you have any problems the morning of surgery.  Remember: Do not eat food after midnight: Clear liquids are ok until:9am on Monday   You may brush your teeth the morning of surgery.  Take these meds the morning of surgery with a sip of water: Metoprolol, protonix DO NOT wear jewelry, eye make-up, lipstick,body lotion, or dark fingernail polish.  (Polished toes are ok) You may wear deodorant.  If you are to be admitted after surgery, leave suitcase in car until your room has been assigned. Patients discharged on the day of surgery will not be allowed to drive home. Wear loose fitting, comfortable clothes for your ride home.

## 2014-11-01 ENCOUNTER — Ambulatory Visit (HOSPITAL_COMMUNITY): Payer: 59 | Admitting: Anesthesiology

## 2014-11-01 ENCOUNTER — Ambulatory Visit (HOSPITAL_COMMUNITY)
Admission: RE | Admit: 2014-11-01 | Discharge: 2014-11-02 | Disposition: A | Discharge status: Home / Self Care | Payer: 59 | Source: Ambulatory Visit | Attending: Obstetrics and Gynecology | Admitting: Obstetrics and Gynecology

## 2014-11-01 ENCOUNTER — Encounter (HOSPITAL_COMMUNITY): Payer: Self-pay | Admitting: Obstetrics and Gynecology

## 2014-11-01 ENCOUNTER — Encounter (HOSPITAL_COMMUNITY)
Admission: RE | Disposition: A | Discharge status: Home / Self Care | Payer: Self-pay | Source: Ambulatory Visit | Attending: Obstetrics and Gynecology

## 2014-11-01 DIAGNOSIS — D250 Submucous leiomyoma of uterus: Secondary | ICD-10-CM | POA: Insufficient documentation

## 2014-11-01 DIAGNOSIS — K219 Gastro-esophageal reflux disease without esophagitis: Secondary | ICD-10-CM | POA: Diagnosis not present

## 2014-11-01 DIAGNOSIS — K660 Peritoneal adhesions (postprocedural) (postinfection): Secondary | ICD-10-CM | POA: Diagnosis not present

## 2014-11-01 DIAGNOSIS — N840 Polyp of corpus uteri: Secondary | ICD-10-CM | POA: Insufficient documentation

## 2014-11-01 DIAGNOSIS — D509 Iron deficiency anemia, unspecified: Secondary | ICD-10-CM | POA: Insufficient documentation

## 2014-11-01 DIAGNOSIS — N920 Excessive and frequent menstruation with regular cycle: Secondary | ICD-10-CM | POA: Diagnosis present

## 2014-11-01 DIAGNOSIS — F329 Major depressive disorder, single episode, unspecified: Secondary | ICD-10-CM | POA: Insufficient documentation

## 2014-11-01 DIAGNOSIS — N72 Inflammatory disease of cervix uteri: Secondary | ICD-10-CM | POA: Diagnosis not present

## 2014-11-01 DIAGNOSIS — D251 Intramural leiomyoma of uterus: Secondary | ICD-10-CM | POA: Diagnosis not present

## 2014-11-01 DIAGNOSIS — Z9079 Acquired absence of other genital organ(s): Secondary | ICD-10-CM

## 2014-11-01 DIAGNOSIS — I10 Essential (primary) hypertension: Secondary | ICD-10-CM | POA: Insufficient documentation

## 2014-11-01 DIAGNOSIS — Z90722 Acquired absence of ovaries, bilateral: Secondary | ICD-10-CM

## 2014-11-01 DIAGNOSIS — Z90710 Acquired absence of both cervix and uterus: Secondary | ICD-10-CM | POA: Diagnosis present

## 2014-11-01 HISTORY — PX: BILATERAL SALPINGECTOMY: SHX5743

## 2014-11-01 HISTORY — PX: ROBOTIC ASSISTED TOTAL HYSTERECTOMY: SHX6085

## 2014-11-01 HISTORY — PX: LYSIS OF ADHESION: SHX5961

## 2014-11-01 LAB — BLOOD GAS, ARTERIAL
Acid-base deficit: 4.5 mmol/L — ABNORMAL HIGH (ref 0.0–2.0)
Bicarbonate: 19.3 mEq/L — ABNORMAL LOW (ref 20.0–24.0)
Drawn by: 131
FIO2: 0.57 %
LHR: 10 {breaths}/min
MECHVT: 540 mL
O2 SAT: 100 %
PEEP: 0 cmH2O
TCO2: 20.2 mmol/L (ref 0–100)
pCO2 arterial: 31.7 mmHg — ABNORMAL LOW (ref 35.0–45.0)
pH, Arterial: 7.4 (ref 7.350–7.450)
pO2, Arterial: 227 mmHg — ABNORMAL HIGH (ref 80.0–100.0)

## 2014-11-01 LAB — PREPARE RBC (CROSSMATCH)

## 2014-11-01 LAB — ABO/RH: ABO/RH(D): B POS

## 2014-11-01 LAB — PREGNANCY, URINE: PREG TEST UR: NEGATIVE

## 2014-11-01 SURGERY — ROBOTIC ASSISTED TOTAL HYSTERECTOMY
Anesthesia: General | Site: Abdomen

## 2014-11-01 MED ORDER — BUPIVACAINE HCL (PF) 0.25 % IJ SOLN
INTRAMUSCULAR | Status: AC
  Filled 2014-11-01: qty 30

## 2014-11-01 MED ORDER — KETOROLAC TROMETHAMINE 30 MG/ML IJ SOLN
30.0000 mg | Freq: Four times a day (QID) | INTRAMUSCULAR | Status: DC
  Administered 2014-11-01 – 2014-11-02 (×2): 30 mg via INTRAVENOUS
  Filled 2014-11-01 (×2): qty 1

## 2014-11-01 MED ORDER — IBUPROFEN 800 MG PO TABS
800.0000 mg | ORAL_TABLET | Freq: Three times a day (TID) | ORAL | Status: DC | PRN
  Administered 2014-11-02: 800 mg via ORAL
  Filled 2014-11-01: qty 1

## 2014-11-01 MED ORDER — ONDANSETRON HCL 4 MG/2ML IJ SOLN
INTRAMUSCULAR | Status: AC
  Filled 2014-11-01: qty 2

## 2014-11-01 MED ORDER — LACTATED RINGERS IR SOLN
Status: DC | PRN
  Administered 2014-11-01: 3000 mL

## 2014-11-01 MED ORDER — HYDROMORPHONE HCL 1 MG/ML IJ SOLN
INTRAMUSCULAR | Status: DC | PRN
  Administered 2014-11-01: 1 mg via INTRAVENOUS

## 2014-11-01 MED ORDER — ONDANSETRON HCL 4 MG PO TABS: 4.0000 mg | ORAL_TABLET | Freq: Four times a day (QID) | ORAL | Status: DC | PRN

## 2014-11-01 MED ORDER — DEXAMETHASONE SODIUM PHOSPHATE 10 MG/ML IJ SOLN
INTRAMUSCULAR | Status: DC | PRN
  Administered 2014-11-01: 4 mg via INTRAVENOUS

## 2014-11-01 MED ORDER — SCOPOLAMINE 1 MG/3DAYS TD PT72
MEDICATED_PATCH | TRANSDERMAL | Status: DC
Start: 2014-11-01 — End: 2014-11-02
  Administered 2014-11-01: 1.5 mg via TRANSDERMAL
  Filled 2014-11-01: qty 1

## 2014-11-01 MED ORDER — SODIUM CHLORIDE 0.9 % IJ SOLN
INTRAMUSCULAR | Status: AC
  Filled 2014-11-01: qty 10

## 2014-11-01 MED ORDER — ONDANSETRON HCL 4 MG/2ML IJ SOLN: 4.0000 mg | Freq: Four times a day (QID) | INTRAMUSCULAR | Status: DC | PRN

## 2014-11-01 MED ORDER — SODIUM CHLORIDE 0.9 % IV SOLN
INTRAVENOUS | Status: DC | PRN
  Administered 2014-11-01: 14:00:00 via INTRAVENOUS

## 2014-11-01 MED ORDER — VASOPRESSIN 20 UNIT/ML IV SOLN
INTRAVENOUS | Status: AC
  Filled 2014-11-01: qty 1

## 2014-11-01 MED ORDER — ONDANSETRON HCL 4 MG/2ML IJ SOLN
INTRAMUSCULAR | Status: DC | PRN
  Administered 2014-11-01: 4 mg via INTRAVENOUS

## 2014-11-01 MED ORDER — DEXTROSE IN LACTATED RINGERS 5 % IV SOLN: INTRAVENOUS | Status: DC

## 2014-11-01 MED ORDER — CEFAZOLIN SODIUM-DEXTROSE 2-3 GM-% IV SOLR
2.0000 g | INTRAVENOUS | Status: AC
  Administered 2014-11-01: 2 g via INTRAVENOUS

## 2014-11-01 MED ORDER — SODIUM CHLORIDE 0.9 % IJ SOLN
INTRAMUSCULAR | Status: AC
  Filled 2014-11-01: qty 50

## 2014-11-01 MED ORDER — LACTATED RINGERS IV SOLN
INTRAVENOUS | Status: DC
  Administered 2014-11-01 (×2): via INTRAVENOUS

## 2014-11-01 MED ORDER — FENTANYL CITRATE (PF) 250 MCG/5ML IJ SOLN
INTRAMUSCULAR | Status: AC
  Filled 2014-11-01: qty 5

## 2014-11-01 MED ORDER — AMITRIPTYLINE HCL 25 MG PO TABS
100.0000 mg | ORAL_TABLET | Freq: Every day | ORAL | Status: DC
  Administered 2014-11-01: 100 mg via ORAL
  Filled 2014-11-01: qty 4

## 2014-11-01 MED ORDER — PROPOFOL 10 MG/ML IV BOLUS
INTRAVENOUS | Status: AC
  Filled 2014-11-01: qty 20

## 2014-11-01 MED ORDER — PANTOPRAZOLE SODIUM 40 MG PO TBEC
40.0000 mg | DELAYED_RELEASE_TABLET | Freq: Every day | ORAL | Status: DC
  Administered 2014-11-02: 40 mg via ORAL
  Filled 2014-11-01: qty 1

## 2014-11-01 MED ORDER — HYDROMORPHONE HCL 1 MG/ML IJ SOLN
INTRAMUSCULAR | Status: AC
  Filled 2014-11-01: qty 1

## 2014-11-01 MED ORDER — ROPIVACAINE HCL 5 MG/ML IJ SOLN
INTRAMUSCULAR | Status: DC | PRN
  Administered 2014-11-01: 10 mL
  Administered 2014-11-01: 60 mL

## 2014-11-01 MED ORDER — SIMETHICONE 80 MG PO CHEW: 80.0000 mg | CHEWABLE_TABLET | Freq: Four times a day (QID) | ORAL | Status: DC | PRN

## 2014-11-01 MED ORDER — ROCURONIUM BROMIDE 100 MG/10ML IV SOLN
INTRAVENOUS | Status: AC
  Filled 2014-11-01: qty 1

## 2014-11-01 MED ORDER — FENTANYL CITRATE (PF) 100 MCG/2ML IJ SOLN
INTRAMUSCULAR | Status: AC
  Filled 2014-11-01: qty 2

## 2014-11-01 MED ORDER — ARTIFICIAL TEARS OP OINT
TOPICAL_OINTMENT | OPHTHALMIC | Status: DC | PRN
  Administered 2014-11-01: 1 via OPHTHALMIC

## 2014-11-01 MED ORDER — HYDROMORPHONE HCL 1 MG/ML IJ SOLN: 0.2000 mg | INTRAMUSCULAR | Status: DC | PRN

## 2014-11-01 MED ORDER — GLYCOPYRROLATE 0.2 MG/ML IJ SOLN
INTRAMUSCULAR | Status: DC | PRN
  Administered 2014-11-01: 0.3 mg via INTRAVENOUS

## 2014-11-01 MED ORDER — BUPIVACAINE HCL (PF) 0.25 % IJ SOLN
INTRAMUSCULAR | Status: DC | PRN
  Administered 2014-11-01 (×2): 10 mL

## 2014-11-01 MED ORDER — OXYCODONE-ACETAMINOPHEN 5-325 MG PO TABS
1.0000 | ORAL_TABLET | ORAL | Status: DC | PRN
  Administered 2014-11-02: 2 via ORAL
  Administered 2014-11-02 (×2): 1 via ORAL
  Filled 2014-11-01: qty 1
  Filled 2014-11-01: qty 2
  Filled 2014-11-01: qty 1

## 2014-11-01 MED ORDER — FENTANYL CITRATE (PF) 100 MCG/2ML IJ SOLN
25.0000 ug | INTRAMUSCULAR | Status: DC | PRN
  Administered 2014-11-01: 25 ug via INTRAVENOUS

## 2014-11-01 MED ORDER — MENTHOL 3 MG MT LOZG: 1.0000 | LOZENGE | OROMUCOSAL | Status: DC | PRN

## 2014-11-01 MED ORDER — KETOROLAC TROMETHAMINE 30 MG/ML IJ SOLN
INTRAMUSCULAR | Status: AC
  Filled 2014-11-01: qty 1

## 2014-11-01 MED ORDER — METOPROLOL SUCCINATE ER 50 MG PO TB24
50.0000 mg | ORAL_TABLET | Freq: Every day | ORAL | Status: DC
  Administered 2014-11-02: 50 mg via ORAL
  Filled 2014-11-01: qty 1

## 2014-11-01 MED ORDER — PROPOFOL 10 MG/ML IV BOLUS
INTRAVENOUS | Status: DC | PRN
  Administered 2014-11-01: 150 mg via INTRAVENOUS

## 2014-11-01 MED ORDER — DEXTROSE IN LACTATED RINGERS 5 % IV SOLN
INTRAVENOUS | Status: DC
  Administered 2014-11-01: 20:00:00 via INTRAVENOUS

## 2014-11-01 MED ORDER — MIDAZOLAM HCL 2 MG/2ML IJ SOLN
INTRAMUSCULAR | Status: DC | PRN
Start: 2014-11-01 — End: 2014-11-01
  Administered 2014-11-01: 2 mg via INTRAVENOUS

## 2014-11-01 MED ORDER — LIDOCAINE HCL (CARDIAC) 20 MG/ML IV SOLN
INTRAVENOUS | Status: AC
  Filled 2014-11-01: qty 5

## 2014-11-01 MED ORDER — KETOROLAC TROMETHAMINE 30 MG/ML IJ SOLN: 30.0000 mg | Freq: Four times a day (QID) | INTRAMUSCULAR | Status: DC

## 2014-11-01 MED ORDER — ARTIFICIAL TEARS OP OINT
TOPICAL_OINTMENT | OPHTHALMIC | Status: AC
  Filled 2014-11-01: qty 3.5

## 2014-11-01 MED ORDER — GLYCOPYRROLATE 0.2 MG/ML IJ SOLN
INTRAMUSCULAR | Status: AC
  Filled 2014-11-01: qty 3

## 2014-11-01 MED ORDER — MEPERIDINE HCL 25 MG/ML IJ SOLN: 6.2500 mg | INTRAMUSCULAR | Status: DC | PRN

## 2014-11-01 MED ORDER — PROMETHAZINE HCL 25 MG/ML IJ SOLN: 6.2500 mg | INTRAMUSCULAR | Status: DC | PRN

## 2014-11-01 MED ORDER — SCOPOLAMINE 1 MG/3DAYS TD PT72
1.0000 | MEDICATED_PATCH | Freq: Once | TRANSDERMAL | Status: DC
  Administered 2014-11-01: 1.5 mg via TRANSDERMAL

## 2014-11-01 MED ORDER — KETOROLAC TROMETHAMINE 30 MG/ML IJ SOLN
INTRAMUSCULAR | Status: DC | PRN
Start: 2014-11-01 — End: 2014-11-01
  Administered 2014-11-01: 30 mg via INTRAVENOUS

## 2014-11-01 MED ORDER — MIDAZOLAM HCL 2 MG/2ML IJ SOLN
INTRAMUSCULAR | Status: AC
  Filled 2014-11-01: qty 2

## 2014-11-01 MED ORDER — ACETAMINOPHEN 10 MG/ML IV SOLN
1000.0000 mg | Freq: Once | INTRAVENOUS | Status: AC
  Administered 2014-11-01: 1000 mg via INTRAVENOUS
  Filled 2014-11-01: qty 100

## 2014-11-01 MED ORDER — ROCURONIUM BROMIDE 100 MG/10ML IV SOLN
INTRAVENOUS | Status: DC | PRN
  Administered 2014-11-01: 5 mg via INTRAVENOUS
  Administered 2014-11-01: 50 mg via INTRAVENOUS
  Administered 2014-11-01 (×3): 10 mg via INTRAVENOUS

## 2014-11-01 MED ORDER — ROPIVACAINE HCL 5 MG/ML IJ SOLN
INTRAMUSCULAR | Status: AC
  Filled 2014-11-01: qty 60

## 2014-11-01 MED ORDER — CEFAZOLIN SODIUM-DEXTROSE 2-3 GM-% IV SOLR
INTRAVENOUS | Status: AC
  Filled 2014-11-01: qty 50

## 2014-11-01 MED ORDER — LIDOCAINE HCL (CARDIAC) 20 MG/ML IV SOLN
INTRAVENOUS | Status: DC | PRN
  Administered 2014-11-01: 80 mg via INTRAVENOUS

## 2014-11-01 MED ORDER — FENTANYL CITRATE (PF) 100 MCG/2ML IJ SOLN
INTRAMUSCULAR | Status: DC | PRN
  Administered 2014-11-01: 25 ug via INTRAVENOUS
  Administered 2014-11-01: 50 ug via INTRAVENOUS
  Administered 2014-11-01: 100 ug via INTRAVENOUS
  Administered 2014-11-01: 25 ug via INTRAVENOUS
  Administered 2014-11-01: 50 ug via INTRAVENOUS
  Administered 2014-11-01: 100 ug via INTRAVENOUS

## 2014-11-01 MED ORDER — NEOSTIGMINE METHYLSULFATE 10 MG/10ML IV SOLN
INTRAVENOUS | Status: DC | PRN
  Administered 2014-11-01: 2 mg via INTRAVENOUS

## 2014-11-01 MED ORDER — DEXAMETHASONE SODIUM PHOSPHATE 4 MG/ML IJ SOLN
INTRAMUSCULAR | Status: AC
  Filled 2014-11-01: qty 1

## 2014-11-01 SURGICAL SUPPLY — 61 items
BARRIER ADHS 3X4 INTERCEED (GAUZE/BANDAGES/DRESSINGS) ×3 IMPLANT
BRR ADH 4X3 ABS CNTRL BYND (GAUZE/BANDAGES/DRESSINGS) ×2
CATH FOLEY 3WAY  5CC 16FR (CATHETERS) ×1
CATH FOLEY 3WAY 5CC 16FR (CATHETERS) ×2 IMPLANT
CLOTH BEACON ORANGE TIMEOUT ST (SAFETY) ×3 IMPLANT
CONT PATH 16OZ SNAP LID 3702 (MISCELLANEOUS) ×3 IMPLANT
COVER BACK TABLE 60X90IN (DRAPES) ×6 IMPLANT
COVER TIP SHEARS 8 DVNC (MISCELLANEOUS) ×2 IMPLANT
COVER TIP SHEARS 8MM DA VINCI (MISCELLANEOUS) ×1
DECANTER SPIKE VIAL GLASS SM (MISCELLANEOUS) ×3 IMPLANT
DEVICE TROCAR PUNCTURE CLOSURE (ENDOMECHANICALS) IMPLANT
DRAPE WARM FLUID 44X44 (DRAPE) ×3 IMPLANT
DRESSING OPSITE X SMALL 2X3 (GAUZE/BANDAGES/DRESSINGS) ×1 IMPLANT
DURAPREP 26ML APPLICATOR (WOUND CARE) ×3 IMPLANT
ELECT REM PT RETURN 9FT ADLT (ELECTROSURGICAL) ×3
ELECTRODE REM PT RTRN 9FT ADLT (ELECTROSURGICAL) ×2 IMPLANT
GAUZE VASELINE 3X9 (GAUZE/BANDAGES/DRESSINGS) IMPLANT
GLOVE BIOGEL PI IND STRL 7.0 (GLOVE) ×8 IMPLANT
GLOVE BIOGEL PI INDICATOR 7.0 (GLOVE) ×4
GLOVE ECLIPSE 6.5 STRL STRAW (GLOVE) ×3 IMPLANT
HEMOSTAT SURGICEL 4X8 (HEMOSTASIS) ×1 IMPLANT
KIT ACCESSORY DA VINCI DISP (KITS) ×1
KIT ACCESSORY DVNC DISP (KITS) ×2 IMPLANT
LEGGING LITHOTOMY PAIR STRL (DRAPES) ×3 IMPLANT
LIQUID BAND (GAUZE/BANDAGES/DRESSINGS) ×3 IMPLANT
NDL SPNL 22GX7 QUINCKE BK (NEEDLE) IMPLANT
NEEDLE INSUFFLATION 150MM (ENDOMECHANICALS) ×3 IMPLANT
NEEDLE SPNL 22GX7 QUINCKE BK (NEEDLE) IMPLANT
OCCLUDER COLPOPNEUMO (BALLOONS) ×1 IMPLANT
PACK ROBOT WH (CUSTOM PROCEDURE TRAY) ×3 IMPLANT
PACK ROBOTIC GOWN (GOWN DISPOSABLE) ×3 IMPLANT
PAD POSITIONER PINK NONSTERILE (MISCELLANEOUS) ×3 IMPLANT
PAD PREP 24X48 CUFFED NSTRL (MISCELLANEOUS) ×6 IMPLANT
SCISSORS LAP 5X35 DISP (ENDOMECHANICALS) IMPLANT
SET CYSTO W/LG BORE CLAMP LF (SET/KITS/TRAYS/PACK) IMPLANT
SET IRRIG TUBING LAPAROSCOPIC (IRRIGATION / IRRIGATOR) ×3 IMPLANT
SET TRI-LUMEN FLTR TB AIRSEAL (TUBING) ×1 IMPLANT
SUT VIC AB 0 CT1 36 (SUTURE) ×6 IMPLANT
SUT VIC AB 3-0 SH 27 (SUTURE) ×3
SUT VIC AB 3-0 SH 27X BRD (SUTURE) IMPLANT
SUT VICRYL 0 UR6 27IN ABS (SUTURE) ×3 IMPLANT
SUT VICRYL 4-0 PS2 18IN ABS (SUTURE) ×6 IMPLANT
SUT VLOC 180 0 9IN  GS21 (SUTURE) ×2
SUT VLOC 180 0 9IN GS21 (SUTURE) ×2 IMPLANT
SYR 50ML LL SCALE MARK (SYRINGE) ×3 IMPLANT
SYRINGE 10CC LL (SYRINGE) ×3 IMPLANT
TIP RUMI ORANGE 6.7MMX12CM (TIP) IMPLANT
TIP UTERINE 5.1X6CM LAV DISP (MISCELLANEOUS) IMPLANT
TIP UTERINE 6.7X10CM GRN DISP (MISCELLANEOUS) ×1 IMPLANT
TIP UTERINE 6.7X6CM WHT DISP (MISCELLANEOUS) IMPLANT
TIP UTERINE 6.7X8CM BLUE DISP (MISCELLANEOUS) IMPLANT
TOWEL OR 17X24 6PK STRL BLUE (TOWEL DISPOSABLE) ×9 IMPLANT
TROCAR DISP BLADELESS 8 DVNC (TROCAR) IMPLANT
TROCAR DISP BLADELESS 8MM (TROCAR) ×1
TROCAR PORT AIRSEAL 5X120 (TROCAR) ×1 IMPLANT
TROCAR PORT AIRSEAL 8X120 (TROCAR) ×1 IMPLANT
TROCAR XCEL 12X100 BLDLESS (ENDOMECHANICALS) ×3 IMPLANT
TROCAR XCEL NON-BLD 5MMX100MML (ENDOMECHANICALS) ×6 IMPLANT
TROCAR Z-THREAD 12X150 (TROCAR) ×3 IMPLANT
WARMER LAPAROSCOPE (MISCELLANEOUS) ×3 IMPLANT
WATER STERILE IRR 1000ML POUR (IV SOLUTION) ×9 IMPLANT

## 2014-11-01 NOTE — Brief Op Note (Signed)
11/01/2014  5:02 PM  PATIENT:  Joy Contreras  39 y.o. female  PRE-OPERATIVE DIAGNOSIS:  Menorrhagia,  Iron deficiency Anemia, Uterine Fibroids, previous Cesarean section/myomectomy  POST-OPERATIVE DIAGNOSIS:  Menorrhagia,  Iron deficiency Anemia, Uterine Fibroids( submucosal/intramural/SS fibroid),  Abdominal wall adhesion  PROCEDURE:  Procedure(s): ROBOTIC ASSISTED TOTAL HYSTERECTOMY (N/A) BILATERAL SALPINGECTOMY (Bilateral) LYSIS OF ADHESION (N/A)  SURGEON:  Surgeon(s) and Role:    * Servando Salina, MD - Primary  PHYSICIAN ASSISTANT:   ASSISTANTS: Julianne Handler, CNM   ANESTHESIA:   general   Findings: 13 weeks irreg fibroids, endometrial  polyp, nl right tube, nl right ov, left tube, appendix, nl liver, GB, SM fibroids x 2 , omental adhesions to ant abd wall  EBL:  Total I/O In: 1751 [I.V.:1200; Blood:551] Out: 700 [Urine:600; Blood:100]  BLOOD ADMINISTERED:2 units CC PRBC  DRAINS: none   LOCAL MEDICATIONS USED:  MARCAINE    and BUPIVICAINE   SPECIMEN:  Source of Specimen:  uterus with cervix, fibroids, tubes  DISPOSITION OF SPECIMEN:  PATHOLOGY  COUNTS:  YES  TOURNIQUET:  * No tourniquets in log *  DICTATION: .Other Dictation: Dictation Number [id number]  PLAN OF CARE: Admit for overnight observation  PATIENT DISPOSITION:  PACU - hemodynamically stable.   Delay start of Pharmacological VTE agent (>24hrs) due to surgical blood loss or risk of bleeding: no

## 2014-11-01 NOTE — Transfer of Care (Signed)
Immediate Anesthesia Transfer of Care Note  Patient: Joy Contreras  Procedure(s) Performed: Procedure(s): ROBOTIC ASSISTED TOTAL HYSTERECTOMY (N/A) BILATERAL SALPINGECTOMY (Bilateral) LYSIS OF ADHESION (N/A)  Patient Location: PACU  Anesthesia Type:General  Level of Consciousness: awake, alert , oriented and patient cooperative  Airway & Oxygen Therapy: Patient Spontanous Breathing and Patient connected to nasal cannula oxygen  Post-op Assessment: Report given to RN and Post -op Vital signs reviewed and stable  Post vital signs: Reviewed and stable  Last Vitals:  Filed Vitals:   11/01/14 1124  BP: 131/82  Pulse: 100  Temp: 36.9 C  Resp: 20    Complications: No apparent anesthesia complications

## 2014-11-01 NOTE — Anesthesia Postprocedure Evaluation (Signed)
  Anesthesia Post-op Note  Patient: Joy Contreras  Procedure(s) Performed: Procedure(s): ROBOTIC ASSISTED TOTAL HYSTERECTOMY (N/A) BILATERAL SALPINGECTOMY (Bilateral) LYSIS OF ADHESION (N/A)  Patient is awake, responsive, moving her legs, and has signs of resolution of her numbness. Pain and nausea are reasonably well controlled. Vital signs are stable and clinically acceptable. Oxygen saturation is clinically acceptable. There are no apparent anesthetic complications at this time. Patient is ready for discharge.

## 2014-11-01 NOTE — Progress Notes (Signed)
Foley cath discontinued per order. Patient tolerated well.

## 2014-11-01 NOTE — Anesthesia Procedure Notes (Signed)
Procedure Name: Intubation Date/Time: 11/01/2014 12:57 PM Performed by: Raenette Rover Pre-anesthesia Checklist: Patient identified, Emergency Drugs available, Suction available and Patient being monitored Patient Re-evaluated:Patient Re-evaluated prior to inductionOxygen Delivery Method: Circle system utilized Preoxygenation: Pre-oxygenation with 100% oxygen Intubation Type: IV induction Ventilation: Mask ventilation without difficulty Laryngoscope Size: Mac and 3 Grade View: Grade II Tube type: Oral Tube size: 7.0 mm Number of attempts: 1 Airway Equipment and Method: Stylet Placement Confirmation: ETT inserted through vocal cords under direct vision,  breath sounds checked- equal and bilateral,  positive ETCO2 and CO2 detector Secured at: 20 cm Tube secured with: Tape Dental Injury: Teeth and Oropharynx as per pre-operative assessment

## 2014-11-02 ENCOUNTER — Encounter (HOSPITAL_COMMUNITY): Payer: Self-pay | Admitting: *Deleted

## 2014-11-02 DIAGNOSIS — N920 Excessive and frequent menstruation with regular cycle: Secondary | ICD-10-CM | POA: Diagnosis not present

## 2014-11-02 LAB — BASIC METABOLIC PANEL
ANION GAP: 6 (ref 5–15)
BUN: 6 mg/dL (ref 6–20)
CALCIUM: 8 mg/dL — AB (ref 8.9–10.3)
CO2: 23 mmol/L (ref 22–32)
CREATININE: 0.66 mg/dL (ref 0.44–1.00)
Chloride: 109 mmol/L (ref 101–111)
Glucose, Bld: 112 mg/dL — ABNORMAL HIGH (ref 65–99)
Potassium: 4 mmol/L (ref 3.5–5.1)
Sodium: 138 mmol/L (ref 135–145)

## 2014-11-02 LAB — CBC
HCT: 32.7 % — ABNORMAL LOW (ref 36.0–46.0)
Hemoglobin: 10.3 g/dL — ABNORMAL LOW (ref 12.0–15.0)
MCH: 24.1 pg — AB (ref 26.0–34.0)
MCHC: 31.5 g/dL (ref 30.0–36.0)
MCV: 76.4 fL — AB (ref 78.0–100.0)
PLATELETS: 307 10*3/uL (ref 150–400)
RBC: 4.28 MIL/uL (ref 3.87–5.11)
RDW: 15.3 % (ref 11.5–15.5)
WBC: 13.6 10*3/uL — ABNORMAL HIGH (ref 4.0–10.5)

## 2014-11-02 MED ORDER — IBUPROFEN 800 MG PO TABS: 800.0000 mg | ORAL_TABLET | Freq: Three times a day (TID) | ORAL | Status: DC | PRN

## 2014-11-02 MED ORDER — OXYCODONE-ACETAMINOPHEN 5-325 MG PO TABS: 1.0000 | ORAL_TABLET | ORAL | Status: DC | PRN

## 2014-11-02 NOTE — Progress Notes (Signed)
Out in wheelchair  Ambulated to car

## 2014-11-02 NOTE — Discharge Instructions (Signed)
Call if temperature greater than equal to 100.4, nothing per vagina for 4-6 weeks or severe nausea vomiting, increased incisional pain , drainage or redness in the incision site, no straining with bowel movements, showers no bath °

## 2014-11-02 NOTE — Discharge Summary (Signed)
Physician Discharge Summary  Patient ID: Joy Contreras MRN: [id number] DOB/AGE: Sep 21, 1975 39 y.o.  Admit date: 11/01/2014 Discharge date: 11/02/2014  Admission Diagnoses: menorrhagia, IDA, uterine fibroid, hx myomectomy/C/S  Discharge Diagnoses: menorrhagia, IDA, uterine fibroid, hx myomectomy/C/S, abdominal wall adhesion Active Problems:   S/P hysterectomy   Discharged Condition: stable  Hospital Course: pt underwent robotic total hysterectomy, bilateral salpingectomy, LOA. Uncomplicated postop course  Consults: None  Significant Diagnostic Studies: labs:  CBC    Component Value Date/Time   WBC 13.6* 11/02/2014 0518   RBC 4.28 11/02/2014 0518   HGB 10.3* 11/02/2014 0518   HCT 32.7* 11/02/2014 0518   PLT 307 11/02/2014 0518   MCV 76.4* 11/02/2014 0518   MCH 24.1* 11/02/2014 0518   MCHC 31.5 11/02/2014 0518   RDW 15.3 11/02/2014 0518   LYMPHSABS 2.3 02/16/2014 1102   MONOABS 0.4 02/16/2014 1102   EOSABS 0.1 02/16/2014 1102   BASOSABS 0.1 02/16/2014 1102    CMP Latest Ref Rng 11/02/2014 10/28/2014 02/16/2014  Glucose 65 - 99 mg/dL 112(H) 128(H) 81  BUN 6 - 20 mg/dL 6 9 8   Creatinine 0.44 - 1.00 mg/dL 0.66 0.63 0.75  Sodium 135 - 145 mmol/L 138 135 144  Potassium 3.5 - 5.1 mmol/L 4.0 3.7 4.6  Chloride 101 - 111 mmol/L 109 107 107  CO2 22 - 32 mmol/L 23 23 25   Calcium 8.9 - 10.3 mg/dL 8.0(L) 8.5(L) 9.0  Total Protein 6.0 - 8.3 g/dL - - 7.4  Total Bilirubin 0.3 - 1.2 mg/dL - - 0.2(L)  Alkaline Phos 39 - 117 U/L - - 65  AST 0 - 37 U/L - - 25  ALT 0 - 35 U/L - - 18     Treatments: surgery: robotic total hysterectomy, bilateral salpingectomy  Discharge Exam: Blood pressure 97/54, pulse 84, temperature 98.7 F (37.1 C), temperature source Oral, resp. rate 18, SpO2 100 %. General appearance: alert, cooperative and no distress Back: no tenderness to percussion or palpation Resp: clear to auscultation bilaterally Cardio: regular rate and rhythm, S1, S2 normal, no  murmur, click, rub or gallop GI: soft, non-tender; bowel sounds normal; no masses,  no organomegaly and incison well approximated Pelvic: deferred Extremities: no edema, redness or tenderness in the calves or thighs  Disposition: 01-Home or Self Care  Discharge Instructions    Diet - low sodium heart healthy    Complete by:  As directed      Discharge instructions    Complete by:  As directed   Call if temperature greater than equal to 100.4, nothing per vagina for 4-6 weeks or severe nausea vomiting, increased incisional pain , drainage or redness in the incision site, no straining with bowel movements, showers no bath     Increase activity slowly    Complete by:  As directed      No wound care    Complete by:  As directed             Medication List    TAKE these medications        amitriptyline 100 MG tablet  Commonly known as:  ELAVIL  Take 1 tablet (100 mg total) by mouth at bedtime.     ibuprofen 800 MG tablet  Commonly known as:  ADVIL,MOTRIN  Take 1 tablet (800 mg total) by mouth every 8 (eight) hours as needed (mild pain).     metoprolol succinate 50 MG 24 hr tablet  Commonly known as:  TOPROL-XL  Take 1 tablet (50 mg  total) by mouth daily.     oxyCODONE-acetaminophen 5-325 MG per tablet  Commonly known as:  PERCOCET/ROXICET  Take 1-2 tablets by mouth every 4 (four) hours as needed for severe pain (moderate to severe pain (when tolerating fluids)).           Follow-up Information    Follow up with Crescencio Jozwiak A, MD In 2 weeks.   Specialty:  Obstetrics and Gynecology   Contact information:   [address] [place] [address] [phone]       Signed: Servando Salina A 11/02/2014, 8:52 AM

## 2014-11-02 NOTE — Progress Notes (Signed)
Subjective: Patient reports tolerating PO and no problems voiding.    Objective: I have reviewed patient's vital signs.  vital signs, medications and labs. Filed Vitals:   11/02/14 0613  BP: 97/54  Pulse: 84  Temp: 98.7 F (37.1 C)  Resp: 18   I/O last 3 completed shifts: In: 2938.8 [P.O.:340; I.V.:2047.8; Blood:551] Out: 3200 [Urine:3100; Blood:100]    Lab Results  Component Value Date   WBC 13.6* 11/02/2014   HGB 10.3* 11/02/2014   HCT 32.7* 11/02/2014   MCV 76.4* 11/02/2014   PLT 307 11/02/2014   Lab Results  Component Value Date   CREATININE 0.66 11/02/2014    EXAM General: alert, cooperative and no distress Resp: clear to auscultation bilaterally Cardio: regular rate and rhythm GI: soft active BS. incision well approximated, no erythema/induration or drainage Extremities: no edema, redness or tenderness in the calves or thighs Vaginal Bleeding: none Back: nl CVAT  Assessment: s/p Procedure(s): ROBOTIC ASSISTED TOTAL HYSTERECTOMY BILATERAL SALPINGECTOMY LYSIS OF ADHESION: stable, progressing well and tolerating diet  Plan: Advance diet Encourage ambulation Discharge home  D/c instructions reviewed.  F/u 2weeks      Alecea Trego A, MD 11/02/2014 7:53 AM    11/02/2014, 7:53 AM

## 2014-11-02 NOTE — Op Note (Signed)
NAMETOMECA, HELM            ACCOUNT NO.:  [id number]  MEDICAL RECORD NO.:  [id number]  LOCATION:  9320                          FACILITY:  [facility]  PHYSICIAN:  Servando Salina, M.D.DATE OF BIRTH:  December 04, 1975  DATE OF PROCEDURE:  11/01/2014 DATE OF DISCHARGE:  11/02/2014                              OPERATIVE REPORT   PREOPERATIVE DIAGNOSES: 1. Menorrhagia. 2. Fibroid uterus. 3. Iron-deficiency anemia. 4. Previous cesarean section/myomectomy.  POSTOPERATIVE DIAGNOSES: 1. Submucosal fibroids. 2. Intramural fibroids. 3. Endometrial polyp. 4. Iron-deficiency anemia. 5. Menorrhagia. 6. Previous cesarean section/myomectomy. 7. Abdominal wall adhesion.  PROCEDURES:  Da Vinci robotic total hysterectomy, bilateral Salpingectomy, lysis of adhesion.  ANESTHESIA:  General.  SURGEON:  Servando Salina, MD  ASSISTANT:  Julianne Handler, CNM  DESCRIPTION OF PROCEDURE:  Under adequate general anesthesia, the patient was placed in the dorsal lithotomy position.  She was positioned for robotic surgery.  Examination under anesthesia revealed a 13 week sized irregular uterus.  No adnexal masses could be appreciated, but limited by the patient's large uterus.  An arterial line was placed on the patient when her preoperative hemoglobin was 8.1.  The patient per Anesthesia had an ABG done with a hemoglobin of 7.2. The decision made by anesthesia to do blood transfusion. The patient was prepped and draped for robotic surgery.  A 3-way Foley catheter was sterilely placed.  A weighted speculum was placed in the vagina.  Sims retractor was placed anteriorly.  The cervix had  0 Vicryl figure-of- eight sutures placed on the anterior and  on the posterior lip of the cervix.  The uterus sounded to 11 cm.  The cervix was easily dilated to #27 [facility] dilator.  A #10 uterine manipulator with a small RUMI cup was introduced into the uterine cavity.  The retractors were then removed. Attention was  then turned to the abdomen.  The patient has previous laparotomy scars and infraumbilical scar.  Supraumbilical 0.03% Marcaine was injected in a vertical fashion.  Vertical skin incision was then made.  Veress needle was introduced, tested with saline solution.  2.5 L of CO2 was insufflated.  Veress needle was then removed.  A 12-mm disposable trocar with sleeve was introduced into the abdomen without incident.  The robotic camera was then placed through that port.  Entry into the abdomen was notable for some bleeding on the anterior wall down in the left lower quadrant, of which etiology was unclear.  Panoramic inspection showed no other areas of bleeding.  Normal liver edge was noted.  Gallbladder was prominent.  Appendix was normal.  There was an omental adhesion adherent to the anterior abdominal wall.  The uterus was irregular, wide based.  The anterior and posterior cul-de-sac could not be seen.  Additional robotic port sites were then placed, 2 on the left about 8-10 cm apart, after0.25% Marcaine was injected on the skin.  An incision was then made on both sites, and 8 mm ports were placed under direct visualization.  On the contralateral side, #8 air seal was placed in the right lower quadrant, and the robotic port site was placed in between the camera port and the AirSeal site and that was placed under direct visualization.  The  probe was then used to inspect the pelvis again, limited mobility of the uterus was initially noted to the multiple fibroids, particular one in the posterior aspect of the uterus. The right ovary could be seen.  The left ovary was surgically absent. There was a suggestion of possible tube on the left.  Nonetheless, the procedure was started with the robot being docked to the patient's left side.  The PK dissector was placed on arm #2; in arm #1, monopolar scissors; in arm #3, the Prograsp  was placed.  Adjustments were made on the right-sided robotic port  due to the inability to put the monopolar scissors lateral to the anterior abdominal wall adhesions.  Once all this was done, I left the patient's bedside and went to the surgical console.  At the surgical console, the procedure was started with the omental adhesion being serially clamped, cauterized, and then cut, removed from its anterior site. The hysterectomy was started on the left with the uterus pushed upwardly and deviated to the patient's right.  At that point, the surgical absence of the left ovary was noted.  The fallopian tube remaining was noted.  It was the mesosalpinx of that left fallopian tube serially clamped, cauterized, and then cut, and the tube removed.  The round ligament was then clamped, cauterized, and cut.  The posterior leaf was then opened, and the anterior leaf of the broad ligament was also opened and carried around anteriorly.  The ureter was identified on the left peristalsing deep into pelvis.  The bladder anterior area was identified. Evidence of prior cesarean section was noted.  The bladder peritoneum was opened transversely.  The bladder was sharply dissected off the RUMI cup and displaced inferiorly.  Small bleeders were cauterized.  The posterior leaf of the broad ligament continued to be dissected off the lateral aspect of the uterus on the left and displaced inferiorly.  Uterine vessels were then isolated. They were prominent and serially clamped, cauterized, and subsequently cut.  Attention was then turned to the opposite side where the remaining vesicouterine peritoneum was opened transversely.  The bladder was sharply dissected and displaced inferiorly.  The round ligament was clamped, cauterized, but not cut.  The ureter was identified easily peristalsing on the right.  The fallopian tube was grasped at its proximal portion and was clamped, cauterized.  The mesosalpinx was serially clamped, cauterized, and then cut with subsequent removal  of the fallopian tube.  The window in the posterior leaf of the broad ligament was then made, and the right utero-ovarian ligament was then serially clamped, cauterized, and then cut.  The posterior leaf of the broad ligaments continued to be dissected and carried down inferiorly to displace the ureter further.  Removal of the bladder off the RUMI cup in the lower uterine segment was continued.  The uterine vessels were prominent again on the right.  They were subsequently clamped, cauterized, and then cut.  Once this was done, the vaginal insufflator was done.  The cervicovaginal junction at the upper part of the RUMI cup was then opened transversely and carried around circumferentially. This then resulted in severing the cervix from its vaginal attachment. The uterus was too big to deliver through the vagina, and, therefore, the specimen was brought down to touch the pelvis in order to create a grounding field.  The monopolar scissors were then used the bivalve partially the anterior aspect of the uterus.  In doing so, the endometrium was entered.  There was a polyp  noted, which was subsequently identified.  There were 2 large kissing submucosal fibroid. Decision was then made to then remove those fibroids.  At that point, the Prograsp was replaced by the robotic tenaculum, and this was then utilized with the PK dissector to bluntly dissect both fibroids out of their bases.  As that was done, the polyp was then removed and held, and the uterus was then collapsed, able to be removed through the vagina.  All other specimens were then brought out through the vaginal incision.  The monopolar scissors and the PK dissector were then replaced by  Mega suture needle driver as well as a long-tipped forceps. 0 V-Loc was then introduced, and using two, the vaginal cuff was then sewn from lateral to medial and back to the lateral with overlapping in the midline.  Intraoperatively, palpation the  vaginal cuff shows good approximation.  There was bleeding along the bladder overlying the bladder area, which was gently cauterized.  The abdomen was partially decompressed in order to facilitate visibility of other bleeders, and this was done, as the pressure was brought down.  Small bleeders cauterized.  Abdomen was then irrigated and suctioned.  The ureters were both identified and noted to be peristalsing well. The pedicles were inspected and good hemostasis noted.  The procedure was felt to be at that point complete.  The robotic instruments were then all removed.   The appendix was noted to be normal.  The robot was then undocked.  The port sites remained.  I then went back to the bedside sterilely, and at that point, it was then noted to have some bleeding in the pelvis.  It was then noted that it was the bladder area that was bleeding from the left.  This was then cauterized with using the [place].  Once hemostasis was achieved, the abdomen was irrigated and suctioned.  60 mL of bupivacaine was placed at the beginning of the surgery, and Surgicel was placed overlying the vaginal cuff, and some additional bupivacaine was then placed over that.  All instruments were then removed under direct visualization, and the AirSeal was moved per the manufacturer's instructions.  The fascia was identified in the supraumbilical site, and 0 Vicryl figure-of-eight suture was then placed.  The skin incisions were then all closed with 4-0 Vicryl subcuticular sutures.  The vagina was again inspected, palpated. Vaginal cuff appeared to be well-approximated with a small superficial tear on the right lateral wall at the introitus entrance for which 3-0 Vicryl figure-of-eight suture was easily placed with good hemostasis. Urine was clear yellow.  SPECIMENS:  Uterus with cervix, separated fibroids, endometrial polyp, bilateral tubes All sent to Pathology.  The specimen weighed 488.5 g.  ESTIMATED  BLOOD LOSS:  100 ml.  INTRAOPERATIVE FLUID:  1200 mL crystalloid, 2 units of packed red cells.  URINE OUTPUT:  600 mL clear yellow urine.  COUNTS:  Sponge and instrument counts x2 was correct.  COMPLICATION:  None.  DISPOSITION:  The patient tolerated the procedure well, was transferred to recovery room in stable condition.     Servando Salina, M.D.     /MEDQ  D:  11/01/2014  T:  11/02/2014  Job:  [id number]

## 2014-11-05 LAB — TYPE AND SCREEN
ABO/RH(D): B POS
Antibody Screen: NEGATIVE
UNIT DIVISION: 0
UNIT DIVISION: 0
Unit division: 0
Unit division: 0

## 2015-02-03 ENCOUNTER — Other Ambulatory Visit: Payer: Self-pay | Admitting: *Deleted

## 2015-02-03 DIAGNOSIS — R5383 Other fatigue: Secondary | ICD-10-CM

## 2015-02-03 MED ORDER — AMITRIPTYLINE HCL 100 MG PO TABS: 100.0000 mg | ORAL_TABLET | Freq: Every day | ORAL | Status: DC

## 2015-05-17 ENCOUNTER — Other Ambulatory Visit: Payer: Self-pay | Admitting: Internal Medicine

## 2015-05-17 MED ORDER — SULFAMETHOXAZOLE-TRIMETHOPRIM 800-160 MG PO TABS: 1.0000 | ORAL_TABLET | Freq: Two times a day (BID) | ORAL | Status: DC

## 2015-05-17 NOTE — Progress Notes (Signed)
One week dysuria, urgency, and bad odor to urine. Last UTI yrs ago. In past, when sxs milder, could control with OTC tx but these sxs are worse. Dip + LE and blood. Has had bactrim in past. No travel, recent ABX (summer with TAH), instrumentation. Bactrim DS BID for 3 days.

## 2015-06-20 MED FILL — SULFAMETHOXAZOLE/TMP DS TAB: 800-160 | 3 days supply | Qty: 6 | Fill #0

## 2015-06-20 MED FILL — METOPROLOL SUCC ER 50 MG TA: 50 | 90 days supply | Qty: 90 | Fill #1

## 2015-07-19 MED FILL — AMITRIPTYLINE HCL 100 MG TA: 100 | 90 days supply | Qty: 90 | Fill #1

## 2015-07-21 ENCOUNTER — Ambulatory Visit (INDEPENDENT_AMBULATORY_CARE_PROVIDER_SITE_OTHER): Payer: 59 | Admitting: Internal Medicine

## 2015-07-21 ENCOUNTER — Encounter: Payer: Self-pay | Admitting: Internal Medicine

## 2015-07-21 ENCOUNTER — Ambulatory Visit (HOSPITAL_COMMUNITY): Payer: 59 | Attending: Internal Medicine

## 2015-07-21 VITALS — BP 134/82 | HR 93 | Temp 98.2°F | Ht 63.5 in | Wt 198.4 lb

## 2015-07-21 DIAGNOSIS — E559 Vitamin D deficiency, unspecified: Secondary | ICD-10-CM | POA: Diagnosis not present

## 2015-07-21 DIAGNOSIS — R5383 Other fatigue: Secondary | ICD-10-CM

## 2015-07-21 DIAGNOSIS — B351 Tinea unguium: Secondary | ICD-10-CM

## 2015-07-21 DIAGNOSIS — R9431 Abnormal electrocardiogram [ECG] [EKG]: Secondary | ICD-10-CM | POA: Diagnosis not present

## 2015-07-21 DIAGNOSIS — D509 Iron deficiency anemia, unspecified: Secondary | ICD-10-CM | POA: Diagnosis not present

## 2015-07-21 DIAGNOSIS — R000 Tachycardia, unspecified: Secondary | ICD-10-CM | POA: Insufficient documentation

## 2015-07-21 DIAGNOSIS — Z1331 Encounter for screening for depression: Secondary | ICD-10-CM | POA: Insufficient documentation

## 2015-07-21 DIAGNOSIS — K219 Gastro-esophageal reflux disease without esophagitis: Secondary | ICD-10-CM

## 2015-07-21 DIAGNOSIS — Z79899 Other long term (current) drug therapy: Secondary | ICD-10-CM

## 2015-07-21 DIAGNOSIS — Z0000 Encounter for general adult medical examination without abnormal findings: Secondary | ICD-10-CM

## 2015-07-21 DIAGNOSIS — E049 Nontoxic goiter, unspecified: Secondary | ICD-10-CM

## 2015-07-21 DIAGNOSIS — R5382 Chronic fatigue, unspecified: Secondary | ICD-10-CM

## 2015-07-21 DIAGNOSIS — L600 Ingrowing nail: Secondary | ICD-10-CM

## 2015-07-21 DIAGNOSIS — R0789 Other chest pain: Secondary | ICD-10-CM

## 2015-07-21 NOTE — Progress Notes (Signed)
   Subjective:    Patient ID: Joy Contreras, female    DOB: 02/18/1976, 40 y.o.   MRN: [id number]  HPI  Joy Contreras is here for palpitations. Please see the A&P for the status of the pt's chronic medical problems.  ROS : per ROS section and in problem oriented charting. All other systems are negative. PMHx, Soc hx, and / or Fam hx : Training to run 5K with daughter   Review of Systems  Constitutional: Positive for activity change and fatigue. Negative for unexpected weight change.  Cardiovascular: Positive for chest pain, palpitations and leg swelling.  Skin: Negative for color change and rash.       Hair thinning Ingrown toenails Thickened toe nails  Neurological: Negative for headaches.  Psychiatric/Behavioral: Positive for sleep disturbance.       + snoring Poor memory       Objective:   Physical Exam  Constitutional: She is oriented to person, place, and time. She appears well-developed and well-nourished. No distress.  HENT:  Head: Normocephalic and atraumatic.  Right Ear: External ear normal.  Left Ear: External ear normal.  Nose: Nose normal.  Eyes: Conjunctivae and EOM are normal.  Neck: Normal range of motion. Neck supple. Thyromegaly present.  R lobe > left, nontender, smooth  Cardiovascular: Normal rate, regular rhythm and normal heart sounds.   Pulmonary/Chest: Effort normal and breath sounds normal. No respiratory distress. She has no wheezes.  Musculoskeletal: Normal range of motion. She exhibits edema.  Trace edema R only  Neurological: She is alert and oriented to person, place, and time.  Skin: Skin is warm and dry. She is not diaphoretic.  Psychiatric: She has a normal mood and affect. Her behavior is normal. Judgment and thought content normal.          Assessment & Plan:

## 2015-07-21 NOTE — Patient Instructions (Signed)
F/U one yr

## 2015-07-21 NOTE — Assessment & Plan Note (Signed)
Last HgB 10.3 in June 2016. Last ferritin 10 in Oct 2015. Did get IV iron after that lab test. Required 2 units PRBC during TAH. Now no longer with heavy menses.   PLAN : CBC, ferritin

## 2015-07-21 NOTE — Assessment & Plan Note (Signed)
Ingrown R great toe lateral margin that is painful. Also onchomycosis that toe, plus R 2nd, and L great toe. Trouble cutting own nails.  PLAN : podiatry referral

## 2015-07-21 NOTE — Assessment & Plan Note (Signed)
Vit D level checked by Gyn and scanned in was low in March 2016. Ulis Rias took Vit D 50,000 weekly as prescribed by Gyn. Not rechecked since then. Not taking any supplements currently. Discussed th controversy over Vit D.   PLAN : Vit D level

## 2015-07-21 NOTE — Assessment & Plan Note (Signed)
Has been out of her elavil 100 for a bit. Not sleeping well. Got it refill and will resume. Since not sleeping well, tired during day. Husband states she snores and family can hear throughout home. Uncertain whether apneic episodes. Doubt would use CPAP.  PLAN : resume elavil. If cont to have sleep issues, fatigue, and snoring afterwards - CPAP and consideration of dental devices.

## 2015-07-21 NOTE — Assessment & Plan Note (Addendum)
Joy Contreras had a very brief episode of atypical chest tightness [place] Resolved on its own while walking  PLAN : EKG

## 2015-07-21 NOTE — Assessment & Plan Note (Signed)
Using PPI as needed.  PLAN:  Cont current meds

## 2015-07-21 NOTE — Assessment & Plan Note (Signed)
Reviewed the overview. TSH last checked 10/15. Never had free T4. Feels that the thyroid has slowly enlarged over time. Noticed hair thinning but no other thyroid sxs. Exam does show enlargement of R thyroid > L. Her TSH has also been nl and will assume still is nl. Her TPO Ab, by report, were negative but I can find no original document of this.   PLAN : TSH If nl, see if can get original TPO. If not, repeat.

## 2015-07-22 LAB — LIPID PANEL
Chol/HDL Ratio: 2.9 ratio units (ref 0.0–4.4)
Cholesterol, Total: 189 mg/dL (ref 100–199)
HDL: 65 mg/dL (ref >39)
LDL CALC: 111 mg/dL — AB (ref 0–99)
TRIGLYCERIDES: 66 mg/dL (ref 0–149)
VLDL Cholesterol Cal: 13 mg/dL (ref 5–40)

## 2015-07-22 LAB — VITAMIN D 25 HYDROXY (VIT D DEFICIENCY, FRACTURES): Vit D, 25-Hydroxy: 18.4 ng/mL — ABNORMAL LOW (ref 30.0–100.0)

## 2015-07-22 LAB — CBC
HEMOGLOBIN: 13.9 g/dL (ref 11.1–15.9)
Hematocrit: 40.4 % (ref 34.0–46.6)
MCH: 28.9 pg (ref 26.6–33.0)
MCHC: 34.4 g/dL (ref 31.5–35.7)
MCV: 84 fL (ref 79–97)
Platelets: 245 10*3/uL (ref 150–379)
RBC: 4.81 x10E6/uL (ref 3.77–5.28)
RDW: 14 % (ref 12.3–15.4)
WBC: 5.1 10*3/uL (ref 3.4–10.8)

## 2015-07-22 LAB — T4, FREE: FREE T4: 0.99 ng/dL (ref 0.82–1.77)

## 2015-07-22 LAB — FERRITIN: FERRITIN: 42 ng/mL (ref 15–150)

## 2015-07-22 LAB — TSH: TSH: 0.912 u[IU]/mL (ref 0.450–4.500)

## 2015-08-04 DIAGNOSIS — N63 Unspecified lump in breast: Secondary | ICD-10-CM | POA: Diagnosis not present

## 2015-08-12 ENCOUNTER — Ambulatory Visit (INDEPENDENT_AMBULATORY_CARE_PROVIDER_SITE_OTHER): Payer: 59 | Admitting: Podiatry

## 2015-08-12 ENCOUNTER — Encounter: Payer: Self-pay | Admitting: Podiatry

## 2015-08-12 VITALS — BP 120/86 | HR 114 | Resp 16 | Ht 64.0 in | Wt 198.0 lb

## 2015-08-12 DIAGNOSIS — M722 Plantar fascial fibromatosis: Secondary | ICD-10-CM | POA: Diagnosis not present

## 2015-08-12 DIAGNOSIS — L600 Ingrowing nail: Secondary | ICD-10-CM | POA: Diagnosis not present

## 2015-08-12 NOTE — Progress Notes (Signed)
   Subjective:    Patient ID: Joy Contreras, female    DOB: 04/17/1976, 40 y.o.   MRN: [id number]  HPI Patient presents with a bilateral nail problem; great toes; nail discoloration & thickened nails. Pt stated, "Wants right foot checked for ingrown toenail, hurts on lateral side"   Review of Systems  All other systems reviewed and are negative.      Objective:   Physical Exam        Assessment & Plan:

## 2015-08-12 NOTE — Patient Instructions (Addendum)
ANTIBACTERIAL SOAP INSTRUCTIONS  THE DAY AFTER PROCEDURE  Please follow the instructions your doctor has marked.   Shower as usual. Before getting out, place a drop of antibacterial liquid soap (Dial) on a wet, clean washcloth.  Gently wipe washcloth over affected area.  Afterward, rinse the area with warm water.  Blot the area dry with a soft cloth and cover with antibiotic ointment (neosporin, polysporin, bacitracin) and band aid or gauze and tape  Place 3-4 drops of antibacterial liquid soap in a quart of warm tap water.  Submerge foot into water for 20 minutes.  If bandage was applied after your procedure, leave on to allow for easy lift off, then remove and continue with soak for the remaining time.  Next, blot area dry with a soft cloth and cover with a bandage.  Apply other medications as directed by your doctor, such as cortisporin otic solution (eardrops) or neosporin antibiotic ointment  Soak toe/toes for approximately 1-2 weeks, allow affected toe to air dry periodically throughout the day when not wearing shoes. When wearing a shoe, apply neosporin as needed with a light bandage to protect the nail bed.  Leave original bandage ( this is the big blue bandage you leave the office wearing) in place for 24 hours if tolerable. Begin first soak after the 24 hour period. If severe pain or throbbing occurs you can begin your first soak before the 24 hour period. Submerge toe with bandages on into soapy water to allow bandages to loosen, then remove bandages. Allow area to air dry before dressing with a bandage.  Drainage for approximately 2-4 weeks is normal,  Discomfort to area is to be expected. Tylenol or ibuprofen can be taken as directed if tolerated.  Any severe pain, blistering, increased swelling or redness needs to be evaluated, please call the office with any questions or concerns      Plantar Fasciitis (Heel Spur Syndrome) with Rehab The plantar fascia is a fibrous,  ligament-like, soft-tissue structure that spans the bottom of the foot. Plantar fasciitis is a condition that causes pain in the foot due to inflammation of the tissue. SYMPTOMS   Pain and tenderness on the underneath side of the foot.  Pain that worsens with standing or walking. CAUSES  Plantar fasciitis is caused by irritation and injury to the plantar fascia on the underneath side of the foot. Common mechanisms of injury include:  Direct trauma to bottom of the foot.  Damage to a small nerve that runs under the foot where the main fascia attaches to the heel bone.  Stress placed on the plantar fascia due to bone spurs. RISK INCREASES WITH:   Activities that place stress on the plantar fascia (running, jumping, pivoting, or cutting).  Poor strength and flexibility.  Improperly fitted shoes.  Tight calf muscles.  Flat feet.  Failure to warm-up properly before activity.  Obesity. PREVENTION  Warm up and stretch properly before activity.  Allow for adequate recovery between workouts.  Maintain physical fitness:  Strength, flexibility, and endurance.  Cardiovascular fitness.  Maintain a health body weight.  Avoid stress on the plantar fascia.  Wear properly fitted shoes, including arch supports for individuals who have flat feet.  PROGNOSIS  If treated properly, then the symptoms of plantar fasciitis usually resolve without surgery. However, occasionally surgery is necessary.  RELATED COMPLICATIONS   Recurrent symptoms that may result in a chronic condition.  Problems of the lower back that are caused by compensating for the injury, such as   limping.  Pain or weakness of the foot during push-off following surgery.  Chronic inflammation, scarring, and partial or complete fascia tear, occurring more often from repeated injections.  TREATMENT  Treatment initially involves the use of ice and medication to help reduce pain and inflammation. The use of  strengthening and stretching exercises may help reduce pain with activity, especially stretches of the Achilles tendon. These exercises may be performed at home or with a therapist. Your caregiver may recommend that you use heel cups of arch supports to help reduce stress on the plantar fascia. Occasionally, corticosteroid injections are given to reduce inflammation. If symptoms persist for greater than 6 months despite non-surgical (conservative), then surgery may be recommended.   MEDICATION   If pain medication is necessary, then nonsteroidal anti-inflammatory medications, such as aspirin and ibuprofen, or other minor pain relievers, such as acetaminophen, are often recommended.  Do not take pain medication within 7 days before surgery.  Prescription pain relievers may be given if deemed necessary by your caregiver. Use only as directed and only as much as you need.  Corticosteroid injections may be given by your caregiver. These injections should be reserved for the most serious cases, because they may only be given a certain number of times.  HEAT AND COLD  Cold treatment (icing) relieves pain and reduces inflammation. Cold treatment should be applied for 10 to 15 minutes every 2 to 3 hours for inflammation and pain and immediately after any activity that aggravates your symptoms. Use ice packs or massage the area with a piece of ice (ice massage).  Heat treatment may be used prior to performing the stretching and strengthening activities prescribed by your caregiver, physical therapist, or athletic trainer. Use a heat pack or soak the injury in warm water.  SEEK IMMEDIATE MEDICAL CARE IF:  Treatment seems to offer no benefit, or the condition worsens.  Any medications produce adverse side effects.  EXERCISES- RANGE OF MOTION (ROM) AND STRETCHING EXERCISES - Plantar Fasciitis (Heel Spur Syndrome) These exercises may help you when beginning to rehabilitate your injury. Your symptoms may  resolve with or without further involvement from your physician, physical therapist or athletic trainer. While completing these exercises, remember:   Restoring tissue flexibility helps normal motion to return to the joints. This allows healthier, less painful movement and activity.  An effective stretch should be held for at least 30 seconds.  A stretch should never be painful. You should only feel a gentle lengthening or release in the stretched tissue.  RANGE OF MOTION - Toe Extension, Flexion  Sit with your right / left leg crossed over your opposite knee.  Grasp your toes and gently pull them back toward the top of your foot. You should feel a stretch on the bottom of your toes and/or foot.  Hold this stretch for 10 seconds.  Now, gently pull your toes toward the bottom of your foot. You should feel a stretch on the top of your toes and or foot.  Hold this stretch for 10 seconds. Repeat  times. Complete this stretch 3 times per day.   RANGE OF MOTION - Ankle Dorsiflexion, Active Assisted  Remove shoes and sit on a chair that is preferably not on a carpeted surface.  Place right / left foot under knee. Extend your opposite leg for support.  Keeping your heel down, slide your right / left foot back toward the chair until you feel a stretch at your ankle or calf. If you do not feel   a stretch, slide your bottom forward to the edge of the chair, while still keeping your heel down.  Hold this stretch for 10 seconds. Repeat 3 times. Complete this stretch 2 times per day.   STRETCH  Gastroc, Standing  Place hands on wall.  Extend right / left leg, keeping the front knee somewhat bent.  Slightly point your toes inward on your back foot.  Keeping your right / left heel on the floor and your knee straight, shift your weight toward the wall, not allowing your back to arch.  You should feel a gentle stretch in the right / left calf. Hold this position for 10 seconds. Repeat 3  times. Complete this stretch 2 times per day.  STRETCH  Soleus, Standing  Place hands on wall.  Extend right / left leg, keeping the other knee somewhat bent.  Slightly point your toes inward on your back foot.  Keep your right / left heel on the floor, bend your back knee, and slightly shift your weight over the back leg so that you feel a gentle stretch deep in your back calf.  Hold this position for 10 seconds. Repeat 3 times. Complete this stretch 2 times per day.  STRETCH  Gastrocsoleus, Standing  Note: This exercise can place a lot of stress on your foot and ankle. Please complete this exercise only if specifically instructed by your caregiver.   Place the ball of your right / left foot on a step, keeping your other foot firmly on the same step.  Hold on to the wall or a rail for balance.  Slowly lift your other foot, allowing your body weight to press your heel down over the edge of the step.  You should feel a stretch in your right / left calf.  Hold this position for 10 seconds.  Repeat this exercise with a slight bend in your right / left knee. Repeat 3 times. Complete this stretch 2 times per day.   STRENGTHENING EXERCISES - Plantar Fasciitis (Heel Spur Syndrome)  These exercises may help you when beginning to rehabilitate your injury. They may resolve your symptoms with or without further involvement from your physician, physical therapist or athletic trainer. While completing these exercises, remember:   Muscles can gain both the endurance and the strength needed for everyday activities through controlled exercises.  Complete these exercises as instructed by your physician, physical therapist or athletic trainer. Progress the resistance and repetitions only as guided.  STRENGTH - Towel Curls  Sit in a chair positioned on a non-carpeted surface.  Place your foot on a towel, keeping your heel on the floor.  Pull the towel toward your heel by only curling your  toes. Keep your heel on the floor. Repeat 3 times. Complete this exercise 2 times per day.  STRENGTH - Ankle Inversion  Secure one end of a rubber exercise band/tubing to a fixed object (table, pole). Loop the other end around your foot just before your toes.  Place your fists between your knees. This will focus your strengthening at your ankle.  Slowly, pull your big toe up and in, making sure the band/tubing is positioned to resist the entire motion.  Hold this position for 10 seconds.  Have your muscles resist the band/tubing as it slowly pulls your foot back to the starting position. Repeat 3 times. Complete this exercises 2 times per day.  Document Released: 04/30/2005 Document Revised: 07/23/2011 Document Reviewed: 08/12/2008 [employer] Patient Information 2014 [employer]. 

## 2015-08-12 NOTE — Progress Notes (Signed)
Subjective:     Patient ID: Joy Contreras, female   DOB: 09/08/1975, 40 y.o.   MRN: [id number]  HPI patient presents with long-term history of nail disease with incurvation of the right hallux lateral border that's painful and history of chronic plantar fasciitis bilateral   Review of Systems  All other systems reviewed and are negative.      Objective:   Physical Exam  Constitutional: She is oriented to person, place, and time.  Cardiovascular: Intact distal pulses.   Musculoskeletal: Normal range of motion.  Neurological: She is oriented to person, place, and time.  Skin: Skin is warm.  Nursing note and vitals reviewed.  neurovascular status was found to be intact with muscle strength adequate with range of motion adequate subtalar midtarsal joint. History of plantar fasciitis with minimal discomfort noted currently and patient states that she does stretched and I noted the hallux nail bilateral is thickened with incurvation of the border lateral bilateral with the right being tender at the distal one third of the nail with no drainage and slight distal redness noted. Good digital perfusion is noted and well oriented 3     Assessment:     History of plantar fasciitis right over left with incurvated right hallux lateral border that's painful when pressed    Plan:     H&P and both conditions reviewed. I did print out instructions for stretching exercises and advised on shoe gear modifications and not going barefoot the plantar fascia and for the ingrown toenail I did discuss the nails are probably always can do have some thickness but I've recommended correction of the lateral border right hallux. Patient wants procedure understanding risk and today I infiltrated the right hallux 60 mg Xylocaine Marcaine mixture remove the lateral border exposed matrix and applied phenol 3 applications 30 seconds followed by alcohol lavage and sterile dressing. Gave instructions on soaks and  reappoint

## 2015-08-17 ENCOUNTER — Telehealth: Payer: Self-pay | Admitting: *Deleted

## 2015-08-17 NOTE — Telephone Encounter (Signed)
Left message for patient at [phone] (Cell #) to check to see how they were doing from their ingrown toenail procedure that was performed on Friday, August 12, 2015. Waiting for a response.

## 2015-12-29 MED FILL — AMITRIPTYLINE HCL 100 MG TA: 100 | 90 days supply | Qty: 90 | Fill #2

## 2016-03-28 ENCOUNTER — Other Ambulatory Visit: Payer: Self-pay | Admitting: Internal Medicine

## 2016-04-10 MED FILL — METOPROLOL SUCC ER 50 MG TA: 50 | 90 days supply | Qty: 90 | Fill #0

## 2016-05-01 DIAGNOSIS — H5213 Myopia, bilateral: Secondary | ICD-10-CM | POA: Diagnosis not present

## 2016-05-01 DIAGNOSIS — H52223 Regular astigmatism, bilateral: Secondary | ICD-10-CM | POA: Diagnosis not present

## 2016-05-01 MED FILL — PAZEO 0.7% EYE DROPS: 0.7 | 25 days supply | Qty: 3 | Fill #0

## 2016-06-14 ENCOUNTER — Other Ambulatory Visit: Payer: Self-pay | Admitting: Internal Medicine

## 2016-06-14 DIAGNOSIS — R5383 Other fatigue: Secondary | ICD-10-CM

## 2016-06-14 MED ORDER — AMITRIPTYLINE HCL 100 MG PO TABS: 100.0000 mg | ORAL_TABLET | Freq: Every day | ORAL | 3 refills | Status: DC

## 2016-06-14 MED FILL — AMITRIPTYLINE HCL 100 MG TA: 100 | 90 days supply | Qty: 90 | Fill #0

## 2016-07-04 ENCOUNTER — Telehealth: Payer: Self-pay | Admitting: Internal Medicine

## 2016-07-04 NOTE — Telephone Encounter (Signed)
APT. REMINDER CALL, LMTCB °

## 2016-07-05 ENCOUNTER — Ambulatory Visit (INDEPENDENT_AMBULATORY_CARE_PROVIDER_SITE_OTHER): Payer: 59 | Admitting: Internal Medicine

## 2016-07-05 VITALS — BP 123/78 | HR 93 | Temp 98.2°F | Ht 63.0 in | Wt 205.2 lb

## 2016-07-05 DIAGNOSIS — E049 Nontoxic goiter, unspecified: Secondary | ICD-10-CM | POA: Diagnosis not present

## 2016-07-05 DIAGNOSIS — D509 Iron deficiency anemia, unspecified: Secondary | ICD-10-CM | POA: Diagnosis not present

## 2016-07-05 DIAGNOSIS — L600 Ingrowing nail: Secondary | ICD-10-CM | POA: Diagnosis not present

## 2016-07-05 DIAGNOSIS — R0683 Snoring: Secondary | ICD-10-CM | POA: Diagnosis not present

## 2016-07-05 DIAGNOSIS — R5382 Chronic fatigue, unspecified: Secondary | ICD-10-CM | POA: Diagnosis not present

## 2016-07-05 DIAGNOSIS — R000 Tachycardia, unspecified: Secondary | ICD-10-CM | POA: Diagnosis not present

## 2016-07-05 DIAGNOSIS — G4700 Insomnia, unspecified: Secondary | ICD-10-CM

## 2016-07-05 DIAGNOSIS — K219 Gastro-esophageal reflux disease without esophagitis: Secondary | ICD-10-CM

## 2016-07-05 DIAGNOSIS — R8299 Other abnormal findings in urine: Secondary | ICD-10-CM | POA: Diagnosis not present

## 2016-07-05 DIAGNOSIS — F5104 Psychophysiologic insomnia: Secondary | ICD-10-CM

## 2016-07-05 DIAGNOSIS — R300 Dysuria: Secondary | ICD-10-CM

## 2016-07-05 DIAGNOSIS — L602 Onychogryphosis: Secondary | ICD-10-CM

## 2016-07-05 DIAGNOSIS — R7309 Other abnormal glucose: Secondary | ICD-10-CM

## 2016-07-05 DIAGNOSIS — Z833 Family history of diabetes mellitus: Secondary | ICD-10-CM

## 2016-07-05 DIAGNOSIS — Z0000 Encounter for general adult medical examination without abnormal findings: Secondary | ICD-10-CM

## 2016-07-05 LAB — POCT GLYCOSYLATED HEMOGLOBIN (HGB A1C): HEMOGLOBIN A1C: 5.6

## 2016-07-05 LAB — GLUCOSE, CAPILLARY: GLUCOSE-CAPILLARY: 102 mg/dL — AB (ref 65–99)

## 2016-07-05 MED ORDER — PANTOPRAZOLE SODIUM 40 MG PO TBEC: 40.0000 mg | DELAYED_RELEASE_TABLET | ORAL | 2 refills | Status: DC | PRN

## 2016-07-05 NOTE — Assessment & Plan Note (Signed)
This problem is chronic and stable. She uses a beta blocker and her heart rate has come down appropriately. She is asymptomatic. No side effects to the beta blocker  PLAN:  Cont current meds

## 2016-07-05 NOTE — Assessment & Plan Note (Signed)
This problem is chronic and stable. She takes a PPI when necessary. She has no side effects to this medication. PRN therapy controls her symptoms well.  PLAN:  Cont current meds

## 2016-07-05 NOTE — Progress Notes (Signed)
   Subjective:    Patient ID: Joy Contreras, female    DOB: [birth year], 41 y.o.   MRN: [id number]  HPI  Joy Contreras is here for fatigue. Please see the A&P for the status of the pt's chronic medical problems.  ROS : per ROS section and in problem oriented charting. All other systems are negative.  PMHx, Soc hx, and / or Fam hx : Only getting 5-6 hours of sleep at night. Her family intends to start working out at [employer]. Her mother had diabetes.  Review of Systems  Constitutional: Positive for fatigue.  HENT:       Slight increase in neck size  Respiratory:       Positive for snoring  Gastrointestinal: Positive for constipation.       No heartburn symptoms  Genitourinary: Positive for dysuria. Negative for frequency, hematuria and urgency.       Positive urine odor  Musculoskeletal: Negative for arthralgias and myalgias.  Skin:       Positive thickened toenails Now ingrown toenail pain. Thinning hair  Neurological: Negative for headaches.  Psychiatric/Behavioral: Positive for dysphoric mood and sleep disturbance.       Objective:   Physical Exam  Constitutional: She appears well-developed and well-nourished. No distress.  HENT:  Head: Normocephalic and atraumatic.  Right Ear: External ear normal.  Left Ear: External ear normal.  Nose: Nose normal.  Eyes: Conjunctivae and EOM are normal. Right eye exhibits no discharge. Left eye exhibits no discharge. No scleral icterus.  Neck: Neck supple. Thyromegaly present.  Nontender thyromegaly right greater than left no nodules appreciated no tracheal deviation  Cardiovascular: Normal rate, regular rhythm and normal heart sounds.   No murmur heard. Pulmonary/Chest: Effort normal and breath sounds normal.  Musculoskeletal: She exhibits edema.  Trace edema lower extremities right greater than left  Skin: Skin is warm and dry. She is not diaphoretic.  Psychiatric: She has a normal mood and affect. Her behavior is normal.  Judgment and thought content normal.          Assessment & Plan:

## 2016-07-05 NOTE — Assessment & Plan Note (Signed)
This problem is chronic and stable. She is using Elavil 100 and is having decent results. If she doesn't take it at the proper time then she has to skip the dose because otherwise it makes her too fatigued. It does help her to fall asleep.  PLAN:  Cont current meds

## 2016-07-05 NOTE — Assessment & Plan Note (Signed)
She has a new complaint today of urine odor. She had some dysuria at the end of the urine stream but that has decreased recently. She has no frequency or hematuria. Her last UTI was over a year ago.  Plan : UA with microscopy   Health maintenance Due to weight and family history will check an A1c today which was 5.6 in the normal range

## 2016-07-05 NOTE — Assessment & Plan Note (Signed)
This problem is chronic and stable. Joy Contreras thinks her neck may have gotten slightly bigger but not significantly and not quickly. Joy Contreras has a previous diagnosis of a goiter. I do not feel that her thyroid has enlarged significantly in the right lobe remains greater than the left lobe. With the exception of fatigue and constipation, Joy Contreras has no other symptoms consistent with thyroid abnormalities. Her TSH and free T4 were normal one year ago.  Plan : continue to follow No need for imaging at this time

## 2016-07-05 NOTE — Assessment & Plan Note (Signed)
This problem is chronic and stable. Her hemoglobin increased from 10.3-13.9 after an iron infusion and a TAH. Her ferritin increased from 10-42 after these interventions. No symptoms other than fatigue which can be explained by lack of sleep.  Plan : Continue to follow

## 2016-07-05 NOTE — Assessment & Plan Note (Signed)
This problem is chronic and stable. She is only getting 5-6 hours of sleep at night which clearly is not sufficient. She does not have morning headaches and does not feel that she falls asleep easily during the day. Her husband does say that she snores. She is not ready to get a sleep study and will try to work on getting more sleep.  Plan: Try to get more sleep Try to lose weight Try to exercise more

## 2016-07-06 LAB — URINALYSIS, ROUTINE W REFLEX MICROSCOPIC
Bilirubin, UA: NEGATIVE
Glucose, UA: NEGATIVE
Ketones, UA: NEGATIVE
Leukocytes, UA: NEGATIVE
NITRITE UA: NEGATIVE
PH UA: 6.5 (ref 5.0–7.5)
Protein, UA: NEGATIVE
RBC, UA: NEGATIVE
Specific Gravity, UA: 1.019 (ref 1.005–1.030)
Urobilinogen, Ur: 0.2 mg/dL (ref 0.2–1.0)

## 2016-08-02 MED FILL — METOPROLOL SUCC ER 50 MG TA: 50 | 90 days supply | Qty: 90 | Fill #1

## 2016-08-02 MED FILL — PANTOPRAZOLE SOD DR 40 MG T: 40 | 90 days supply | Qty: 90 | Fill #0

## 2016-08-07 DIAGNOSIS — Z6835 Body mass index (BMI) 35.0-35.9, adult: Secondary | ICD-10-CM | POA: Diagnosis not present

## 2016-08-07 DIAGNOSIS — Z0000 Encounter for general adult medical examination without abnormal findings: Secondary | ICD-10-CM | POA: Diagnosis not present

## 2016-08-07 DIAGNOSIS — R8290 Unspecified abnormal findings in urine: Secondary | ICD-10-CM | POA: Diagnosis not present

## 2016-08-07 DIAGNOSIS — Z1329 Encounter for screening for other suspected endocrine disorder: Secondary | ICD-10-CM | POA: Diagnosis not present

## 2016-08-07 DIAGNOSIS — R300 Dysuria: Secondary | ICD-10-CM | POA: Diagnosis not present

## 2016-08-07 DIAGNOSIS — Z01419 Encounter for gynecological examination (general) (routine) without abnormal findings: Secondary | ICD-10-CM | POA: Diagnosis not present

## 2016-08-07 DIAGNOSIS — Z130 Encounter for screening for diseases of the blood and blood-forming organs and certain disorders involving the immune mechanism: Secondary | ICD-10-CM | POA: Diagnosis not present

## 2016-08-07 DIAGNOSIS — Z13220 Encounter for screening for lipoid disorders: Secondary | ICD-10-CM | POA: Diagnosis not present

## 2016-08-07 DIAGNOSIS — Z1231 Encounter for screening mammogram for malignant neoplasm of breast: Secondary | ICD-10-CM | POA: Diagnosis not present

## 2016-08-07 DIAGNOSIS — Z131 Encounter for screening for diabetes mellitus: Secondary | ICD-10-CM | POA: Diagnosis not present

## 2016-08-16 MED FILL — VIT D2 1.25 MG (50,000 UNIT: 1.25 MG | 84 days supply | Qty: 12 | Fill #0

## 2016-08-16 MED FILL — SULFAMETHOXAZOLE/TMP DS TAB: 800-160 | 3 days supply | Qty: 6 | Fill #0

## 2016-08-23 DIAGNOSIS — N6320 Unspecified lump in the left breast, unspecified quadrant: Secondary | ICD-10-CM | POA: Diagnosis not present

## 2016-09-25 MED FILL — CONTRAVE ER 8-90 MG TABLET: 8-90 | 30 days supply | Qty: 120 | Fill #0

## 2016-10-05 MED FILL — AMITRIPTYLINE HCL 100 MG TA: 100 | 90 days supply | Qty: 90 | Fill #1

## 2016-11-19 DIAGNOSIS — R350 Frequency of micturition: Secondary | ICD-10-CM | POA: Diagnosis not present

## 2016-11-19 DIAGNOSIS — N83291 Other ovarian cyst, right side: Secondary | ICD-10-CM | POA: Diagnosis not present

## 2016-11-19 DIAGNOSIS — R1031 Right lower quadrant pain: Secondary | ICD-10-CM | POA: Diagnosis not present

## 2016-11-19 DIAGNOSIS — R8290 Unspecified abnormal findings in urine: Secondary | ICD-10-CM | POA: Diagnosis not present

## 2016-11-19 DIAGNOSIS — N83299 Other ovarian cyst, unspecified side: Secondary | ICD-10-CM | POA: Diagnosis not present

## 2016-11-26 MED FILL — SULFAMETHOXAZOLE/TMP DS TAB: 800-160 | 3 days supply | Qty: 6 | Fill #0

## 2016-11-30 ENCOUNTER — Telehealth: Payer: Self-pay | Admitting: *Deleted

## 2016-11-30 NOTE — Telephone Encounter (Signed)
Received a new patient fax referral, appt scheduled for August 2nd at [facility]; arrive at 9:30am. Judeth Horn at [facility] and left a message with the date/time of the appt

## 2016-12-13 ENCOUNTER — Encounter: Payer: Self-pay | Admitting: Gynecologic Oncology

## 2016-12-13 ENCOUNTER — Ambulatory Visit: Payer: 59 | Attending: Gynecologic Oncology | Admitting: Gynecologic Oncology

## 2016-12-13 VITALS — BP 128/78 | HR 101 | Temp 99.6°F | Resp 18 | Ht 63.0 in | Wt 202.1 lb

## 2016-12-13 DIAGNOSIS — Z818 Family history of other mental and behavioral disorders: Secondary | ICD-10-CM | POA: Diagnosis not present

## 2016-12-13 DIAGNOSIS — Z79899 Other long term (current) drug therapy: Secondary | ICD-10-CM | POA: Diagnosis not present

## 2016-12-13 DIAGNOSIS — N838 Other noninflammatory disorders of ovary, fallopian tube and broad ligament: Secondary | ICD-10-CM | POA: Insufficient documentation

## 2016-12-13 DIAGNOSIS — Z820 Family history of epilepsy and other diseases of the nervous system: Secondary | ICD-10-CM | POA: Insufficient documentation

## 2016-12-13 DIAGNOSIS — N83201 Unspecified ovarian cyst, right side: Secondary | ICD-10-CM

## 2016-12-13 DIAGNOSIS — F329 Major depressive disorder, single episode, unspecified: Secondary | ICD-10-CM | POA: Insufficient documentation

## 2016-12-13 DIAGNOSIS — Z9889 Other specified postprocedural states: Secondary | ICD-10-CM | POA: Diagnosis not present

## 2016-12-13 DIAGNOSIS — Z8249 Family history of ischemic heart disease and other diseases of the circulatory system: Secondary | ICD-10-CM | POA: Insufficient documentation

## 2016-12-13 DIAGNOSIS — Z90710 Acquired absence of both cervix and uterus: Secondary | ICD-10-CM | POA: Insufficient documentation

## 2016-12-13 DIAGNOSIS — K219 Gastro-esophageal reflux disease without esophagitis: Secondary | ICD-10-CM | POA: Insufficient documentation

## 2016-12-13 NOTE — Progress Notes (Signed)
Consult Note: Gyn-Onc  Consult was requested by Dr.Cousins for the evaluation of Joy Contreras 41 y.o. female  CC: Right complex adnexal mass with solid components and papillary projections  Assessment/Plan:  Ms. Joy Contreras is a 41 y.o.  with a right complex adnexal mass. The concerning findings are the appearance of a solid lesion measuring 1.7 x 1.5 x 1.7 cm and the presence of papillary projections. The recommendation was for right salpingo-oophorectomy. She has a remote history of hysterectomy and left salpingo-oophorectomy.  As such a significant amount of time was spent discussing the consequences of early menopause. The differential diagnosis is inclusive of a cystadenofibroma, low malignant potential or other ovarian neoplasm including invasive disease.  I have reviewed my recommendation for the planned surgery of robotic-assisted right salpingo-oopherectomy with washings.  If malignancy is identified a procedure would be extended to include omentectomy, lymph node dissection, tumor debulking, possible appendectomy and other staging procedures. There is also the consideration that exploratory laparotomy, and bowel resection may be possible but not likely.  Risks discussed included infection, bleeding, damage to nearby organs including bowel, bladder, vessels, and ureters reviewed. We discussed postoperative risks including need for transfusion, infection, PE/ DVT, cardiac events, bowel obstruction and lymphedema. The plan is for placement of an estrogen patch after surgery.   The patient signed consent forms in the office. All of her questions were answered and the patient was comfortable with this plan of care.  [name]  is aware that the procedure will be performed on December 27, 2016  HPI: Ms. Joy Contreras is a 41 y.o.  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 The patient was in her usual state of health until approximately 1 month ago when she noticed the onset of intermittent right lower quadrant pain. The pain is described  as dull intermittent lasting for a few minutes. She denies radiation. There is no associated emesis or nausea.  Pelvic ultrasound on 11/19/2016 is notable for solid appearing mass along with a complex cyst with different fluid levels along with papillary projections. The solid-appearing mass measures 1.7 x 1.5 x 1.7 cm. Trace amount of fluid is noted around the right adnexa. The left adnexa and ovary is surgically absent. A CA-125 collected on November 20 2016 returned with a value of 10.0.  Joy Contreras denies abdominal bloating change in appetite or early satiety. She denies changes in bowel or bladder habits there is no vaginal rectal bleeding or hematuria. She denies any family history of ovary breast or colon cancer.  Review of Systems:  Constitutional  Feels well,  Cardiovascular  No chest pain, shortness of breath, or edema  Pulmonary  No cough or wheeze.  Gastro Intestinal  No nausea, vomitting, or diarrhoea. No bright red blood per rectum, intermittent right lower quadrant abdominal pain as described in history of present illness, change in bowel movement, or constipation.  Genito Urinary  No frequency, urgency, dysuria, no vaginal bleeding Musculo Skeletal  No myalgia, arthralgia, joint swelling or pain  Neurologic  No weakness, numbness, change in gait,  Psychology  No depression, anxiety, insomnia.    Current Meds:  Outpatient Encounter Prescriptions as of 12/13/2016  Medication Sig  . amitriptyline (ELAVIL) 100 MG tablet Take 1 tablet (100 mg total) by mouth at bedtime.  Marland Kitchen ibuprofen (ADVIL,MOTRIN) 800 MG tablet Take 1 tablet (800 mg total) by mouth every 8 (  eight) hours as needed (mild pain).  . metoprolol succinate (TOPROL-XL) 50 MG 24 hr tablet TAKE 1 TABLET BY MOUTH DAILY.  . pantoprazole  (PROTONIX) 40 MG tablet Take 1 tablet (40 mg total) by mouth as needed.   No facility-administered encounter medications on file as of 12/13/2016.     Allergy: No Known Allergies  Social Hx:   Social History   Social History  . Marital status: Married    Spouse name: N/A  . Number of children: N/A  . Years of education: N/A   Occupational History  . Not on file.   Social History Main Topics  . Smoking status: Never Smoker  . Smokeless tobacco: Not on file  . Alcohol use 0.0 oz/week     Comment: socially  . Drug use: No  . Sexual activity: Not on file   Other Topics Concern  . Not on file   Social History Narrative  . No narrative on file    Past Surgical Hx:  Past Surgical History:  Procedure Laterality Date  . BILATERAL SALPINGECTOMY Bilateral 11/01/2014   Procedure: BILATERAL SALPINGECTOMY;  Surgeon: Servando Salina, MD;  Location: [facility];  Service: Gynecology;  Laterality: Bilateral;  . CESAREAN SECTION  11/22/2001  . LEFT OOPHORECTOMY  2002   Dr Garwin Brothers. 2/2 symptomatice fibroma  . LYSIS OF ADHESION N/A 11/01/2014   Procedure: LYSIS OF ADHESION;  Surgeon: Servando Salina, MD;  Location: [facility];  Service: Gynecology;  Laterality: N/A;  . 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 ROBOTIC ASSISTED TOTAL HYSTERECTOMY N/A 11/01/2014   Procedure: ROBOTIC ASSISTED TOTAL HYSTERECTOMY;  Surgeon: Servando Salina, MD;  Location: [facility];  Service: Gynecology;  Laterality: N/A;    Past Medical Hx:  Past Medical History:  Diagnosis Date  . Depression    History of Depression  . Dysrhythmia    tachycardia  . GERD (gastroesophageal reflux disease)    Tx with PPI  . Goiter 2006   Dr Altheimer. Dx with Autonomously functioning goiter during pregnancy. anti-TPO negative.   . Menorrhagia    Being tx by GYN  . Tachycardia    Required BB during pregnancy. Baseline 100-130 at rest    Past Gynecological History:Menarche at age 80 with irregular menses. History of  oral contraceptive pill use. History of hysterectomy in 2016 gravida 2 para 2 cesarean section 2  Patient's last menstrual period was 02/02/2013.  Family Hx:  Family History  Problem Relation Age of Onset  . Seizures Father   . Hypertension Mother   . Mental illness Brother   . Stickler syndrome Sister   . Bipolar disorder Mother   . Schizophrenia Brother   . ADD / ADHD Son     Vitals:  Last menstrual period 02/02/2013.  Physical Exam: BP 128/78 (BP Location: Left Arm, Patient Position: Sitting)   Pulse (!) 101   Temp 99.6 F (37.6 C) (Oral)   Resp 18   Ht 5\' 3"  (1.6 m)   Wt 202 lb 1.6 oz (91.7 kg)   LMP 02/02/2013   SpO2 99%   BMI 35.80 kg/m   WD in NAD Neck  Supple NROM, without any enlargements.  Lymph Node Survey No cervical supraclavicular or inguinal adenopathy Cardiovascular  Pulse normal rate, regularity and rhythm.   Lungs  Clear to auscultation bilaterally. Good air movement.  Skin  No rash/lesions/breakdown  Psychiatry  Alert and oriented appropriate mood affect speech and reasoning. Abdomen  Normoactive bowel sounds, abdomen soft, non-tender.  Surgical  sites intact without evidence of hernia.  no masses at laparoscopic sites  Back No CVA tenderness Genito Urinary  Vulva/vagina: Normal external female genitalia.  No lesions. No discharge or bleeding.  Bladder/urethra:  No lesions or masses  Vagina: no palpable masses   Adnexa: No palpable masses. No cul-de-sac nodularity Rectal  Good tone, no masses no cul de sac nodularity.  Extremities  No bilateral cyanosis, clubbing or edema.   Janie Morning, MD, PhD 12/13/2016, 9:56 AM

## 2016-12-13 NOTE — Patient Instructions (Signed)
Preparing for your Surgery  Plan for surgery on December 27, 2016 with Dr. Janie Morning at [place] will be scheduled for a robotic assisted right salpingo-oophorectomy (possible omentectomy, pelvic and para-aortic lymphadenectomy, staging if a cancer if identified).  Pre-operative Testing -You will receive a phone call from presurgical testing at [facility] to arrange for a pre-operative testing appointment before your surgery.  This appointment normally occurs one to two weeks before your scheduled surgery.   -Bring your insurance card, copy of an advanced directive if applicable, medication list  -At that visit, you will be asked to sign a consent for a possible blood transfusion in case a transfusion becomes necessary during surgery.  The need for a blood transfusion is rare but having consent is a necessary part of your care.     -You should not be taking blood thinners or aspirin at least ten days prior to surgery unless instructed by your surgeon.  Day Before Surgery at [place] will be asked to take in a light diet the day before surgery.  Avoid carbonated beverages.  You will be advised to have nothing to eat or drink after midnight the evening before.     Eat a light diet the day before surgery.  Examples including soups, broths, toast, yogurt, mashed potatoes.  Things to avoid include carbonated beverages (fizzy beverages), raw fruits and raw vegetables, or beans.    If your bowels are filled with gas, your surgeon will have difficulty visualizing your pelvic organs which increases your surgical risks.  Your role in recovery Your role is to become active as soon as directed by your doctor, while still giving yourself time to heal.  Rest when you feel tired. You will be asked to do the following in order to speed your recovery:  - Cough and breathe deeply. This helps toclear and expand your lungs and can prevent pneumonia. You may be given a  spirometer to practice deep breathing. A staff member will show you how to use the spirometer. - Do mild physical activity. Walking or moving your legs help your circulation and body functions return to normal. A staff member will help you when you try to walk and will provide you with simple exercises. Do not try to get up or walk alone the first time. - Actively manage your pain. Managing your pain lets you move in comfort. We will ask you to rate your pain on a scale of zero to 10. It is your responsibility to tell your doctor or nurse where and how much you hurt so your pain can be treated.  Special Considerations -If you are diabetic, you may be placed on insulin after surgery to have closer control over your blood sugars to promote healing and recovery.  This does not mean that you will be discharged on insulin.  If applicable, your oral antidiabetics will be resumed when you are tolerating a solid diet.  -Your final pathology results from surgery should be available by the Friday after surgery and the results will be relayed to you when available.   Blood Transfusion Information WHAT IS A BLOOD TRANSFUSION? A transfusion is the replacement of blood or some of its parts. Blood is made up of multiple cells which provide different functions.  Red blood cells carry oxygen and are used for blood loss replacement.  White blood cells fight against infection.  Platelets control bleeding.  Plasma helps clot blood.  Other blood products are available  for specialized needs, such as hemophilia or other clotting disorders. BEFORE THE TRANSFUSION  Who gives blood for transfusions?   You may be able to donate blood to be used at a later date on yourself (autologous donation).  Relatives can be asked to donate blood. This is generally not any safer than if you have received blood from a stranger. The same precautions are taken to ensure safety when a relative's blood is donated.  Healthy  volunteers who are fully evaluated to make sure their blood is safe. This is blood bank blood. Transfusion therapy is the safest it has ever been in the practice of medicine. Before blood is taken from a donor, a complete history is taken to make sure that person has no history of diseases nor engages in risky social behavior (examples are intravenous drug use or sexual activity with multiple partners). The donor's travel history is screened to minimize risk of transmitting infections, such as malaria. The donated blood is tested for signs of infectious diseases, such as HIV and hepatitis. The blood is then tested to be sure it is compatible with you in order to minimize the chance of a transfusion reaction. If you or a relative donates blood, this is often done in anticipation of surgery and is not appropriate for emergency situations. It takes many days to process the donated blood. RISKS AND COMPLICATIONS Although transfusion therapy is very safe and saves many lives, the main dangers of transfusion include:   Getting an infectious disease.  Developing a transfusion reaction. This is an allergic reaction to something in the blood you were given. Every precaution is taken to prevent this. The decision to have a blood transfusion has been considered carefully by your caregiver before blood is given. Blood is not given unless the benefits outweigh the risks.

## 2016-12-20 NOTE — Patient Instructions (Signed)
Joy Contreras  12/20/2016   Your procedure is scheduled on: 12/27/2016    Report to [facility] Main  Entrance Take [place]  elevators to 3rd floor to  [place] at    [facility] AM.    Call this number if you have problems the morning of surgery [phone]    Remember: ONLY 1 PERSON MAY GO WITH YOU TO SHORT STAY TO GET  READY MORNING OF [place].  Do not eat food or drink liquids :After Midnight.             Eat a light diet the day before surgery.  Examples include: soups, toast, broths , yogurt and mashed potatoes.  Things to avoid include carbonated beverages, raw fruits and vegetables and beans.      Take these medicines the morning of surgery with A SIP OF WATER: Metoprolol ( Lopressor) , Protonix                                 You may not have any metal on your body including hair pins and              piercings  Do not wear jewelry, make-up, lotions, powders or perfumes, deodorant             Do not wear nail polish.  Do not shave  48 hours prior to surgery.                Do not bring valuables to the hospital. [place].  Contacts, dentures or bridgework may not be worn into surgery.       Patients discharged the day of surgery will not be allowed to drive home.  Name and phone number of your driver:  Special Instructions: coughing and deep breathing exercises, leg exercises               Please read over the following fact sheets you were given: _____________________________________________________________________             [facility] - Preparing for Surgery Before surgery, you can play an important role.  Because skin is not sterile, your skin needs to be as free of germs as possible.  You can reduce the number of germs on your skin by washing with CHG (chlorahexidine gluconate) soap before surgery.  CHG is an antiseptic cleaner which kills germs and bonds with the skin to  continue killing germs even after washing. Please DO NOT use if you have an allergy to CHG or antibacterial soaps.  If your skin becomes reddened/irritated stop using the CHG and inform your nurse when you arrive at Short Stay. Do not shave (including legs and underarms) for at least 48 hours prior to the first CHG shower.  You may shave your face/neck. Please follow these instructions carefully:  1.  Shower with CHG Soap the night before surgery and the  morning of Surgery.  2.  If you choose to wash your hair, wash your hair first as usual with your  normal  shampoo.  3.  After you shampoo, rinse your hair and body thoroughly to remove the  shampoo.  4.  Use CHG as you would any other liquid soap.  You can apply chg directly  to the skin and wash                       Gently with a scrungie or clean washcloth.  5.  Apply the CHG Soap to your body ONLY FROM THE NECK DOWN.   Do not use on face/ open                           Wound or open sores. Avoid contact with eyes, ears mouth and genitals (private parts).                       Wash face,  Genitals (private parts) with your normal soap.             6.  Wash thoroughly, paying special attention to the area where your surgery  will be performed.  7.  Thoroughly rinse your body with warm water from the neck down.  8.  DO NOT shower/wash with your normal soap after using and rinsing off  the CHG Soap.                9.  Pat yourself dry with a clean towel.            10.  Wear clean pajamas.            11.  Place clean sheets on your bed the night of your first shower and do not  sleep with pets. Day of Surgery : Do not apply any lotions/deodorants the morning of surgery.  Please wear clean clothes to the hospital/surgery center.  FAILURE TO FOLLOW THESE INSTRUCTIONS MAY RESULT IN THE CANCELLATION OF YOUR SURGERY PATIENT SIGNATURE_________________________________  NURSE  SIGNATURE__________________________________  ________________________________________________________________________  WHAT IS A BLOOD TRANSFUSION? Blood Transfusion Information  A transfusion is the replacement of blood or some of its parts. Blood is made up of multiple cells which provide different functions.  Red blood cells carry oxygen and are used for blood loss replacement.  White blood cells fight against infection.  Platelets control bleeding.  Plasma helps clot blood.  Other blood products are available for specialized needs, such as hemophilia or other clotting disorders. BEFORE THE TRANSFUSION  Who gives blood for transfusions?   Healthy volunteers who are fully evaluated to make sure their blood is safe. This is blood bank blood. Transfusion therapy is the safest it has ever been in the practice of medicine. Before blood is taken from a donor, a complete history is taken to make sure that person has no history of diseases nor engages in risky social behavior (examples are intravenous drug use or sexual activity with multiple partners). The donor's travel history is screened to minimize risk of transmitting infections, such as malaria. The donated blood is tested for signs of infectious diseases, such as HIV and hepatitis. The blood is then tested to be sure it is compatible with you in order to minimize the chance of a transfusion reaction. If you or a relative donates blood, this is often done in anticipation of surgery and is not appropriate for emergency situations. It takes many days to process the donated blood. RISKS AND COMPLICATIONS Although transfusion therapy is very safe and saves many lives, the main dangers of transfusion include:   Getting an infectious disease.  Developing a transfusion reaction. This  is an allergic reaction to something in the blood you were given. Every precaution is taken to prevent this. The decision to have a blood transfusion has been  considered carefully by your caregiver before blood is given. Blood is not given unless the benefits outweigh the risks. AFTER THE TRANSFUSION  Right after receiving a blood transfusion, you will usually feel much better and more energetic. This is especially true if your red blood cells have gotten low (anemic). The transfusion raises the level of the red blood cells which carry oxygen, and this usually causes an energy increase.  The nurse administering the transfusion will monitor you carefully for complications. HOME CARE INSTRUCTIONS  No special instructions are needed after a transfusion. You may find your energy is better. Speak with your caregiver about any limitations on activity for underlying diseases you may have. SEEK MEDICAL CARE IF:   Your condition is not improving after your transfusion.  You develop redness or irritation at the intravenous (IV) site. SEEK IMMEDIATE MEDICAL CARE IF:  Any of the following symptoms occur over the next 12 hours:  Shaking chills.  You have a temperature by mouth above 102 F (38.9 C), not controlled by medicine.  Chest, back, or muscle pain.  People around you feel you are not acting correctly or are confused.  Shortness of breath or difficulty breathing.  Dizziness and fainting.  You get a rash or develop hives.  You have a decrease in urine output.  Your urine turns a dark color or changes to pink, red, or brown. Any of the following symptoms occur over the next 10 days:  You have a temperature by mouth above 102 F (38.9 C), not controlled by medicine.  Shortness of breath.  Weakness after normal activity.  The white part of the eye turns yellow (jaundice).  You have a decrease in the amount of urine or are urinating less often.  Your urine turns a dark color or changes to pink, red, or brown. Document Released: 04/27/2000 Document Revised: 07/23/2011 Document Reviewed: 12/15/2007 ExitCare Patient Information 2014  [place].  _______________________________________________________________________  Incentive Spirometer  An incentive spirometer is a tool that can help keep your lungs clear and active. This tool measures how well you are filling your lungs with each breath. Taking long deep breaths may help reverse or decrease the chance of developing breathing (pulmonary) problems (especially infection) following:  A long period of time when you are unable to move or be active. BEFORE THE PROCEDURE   If the spirometer includes an indicator to show your best effort, your nurse or respiratory therapist will set it to a desired goal.  If possible, sit up straight or lean slightly forward. Try not to slouch.  Hold the incentive spirometer in an upright position. INSTRUCTIONS FOR USE  1. Sit on the edge of your bed if possible, or sit up as far as you can in bed or on a chair. 2. Hold the incentive spirometer in an upright position. 3. Breathe out normally. 4. Place the mouthpiece in your mouth and seal your lips tightly around it. 5. Breathe in slowly and as deeply as possible, raising the piston or the ball toward the top of the column. 6. Hold your breath for 3-5 seconds or for as long as possible. Allow the piston or ball to fall to the bottom of the column. 7. Remove the mouthpiece from your mouth and breathe out normally. 8. Rest for a few seconds and repeat Steps 1  through 7 at least 10 times every 1-2 hours when you are awake. Take your time and take a few normal breaths between deep breaths. 9. The spirometer may include an indicator to show your best effort. Use the indicator as a goal to work toward during each repetition. 10. After each set of 10 deep breaths, practice coughing to be sure your lungs are clear. If you have an incision (the cut made at the time of surgery), support your incision when coughing by placing a pillow or rolled up towels firmly against it. Once you are able to get  out of bed, walk around indoors and cough well. You may stop using the incentive spirometer when instructed by your caregiver.  RISKS AND COMPLICATIONS  Take your time so you do not get dizzy or light-headed.  If you are in pain, you may need to take or ask for pain medication before doing incentive spirometry. It is harder to take a deep breath if you are having pain. AFTER USE  Rest and breathe slowly and easily.  It can be helpful to keep track of a log of your progress. Your caregiver can provide you with a simple table to help with this. If you are using the spirometer at home, follow these instructions: [place] IF:   You are having difficultly using the spirometer.  You have trouble using the spirometer as often as instructed.  Your pain medication is not giving enough relief while using the spirometer.  You develop fever of 100.5 F (38.1 C) or higher. SEEK IMMEDIATE MEDICAL CARE IF:   You cough up bloody sputum that had not been present before.  You develop fever of 102 F (38.9 C) or greater.  You develop worsening pain at or near the incision site. MAKE SURE YOU:   Understand these instructions.  Will watch your condition.  Will get help right away if you are not doing well or get worse. Document Released: 09/10/2006 Document Revised: 07/23/2011 Document Reviewed: 11/11/2006 [facility] [employer] Patient Information 2014 [place], [place].   ________________________________________________________________________

## 2016-12-21 ENCOUNTER — Encounter (HOSPITAL_COMMUNITY)
Admission: RE | Admit: 2016-12-21 | Discharge: 2016-12-21 | Disposition: A | Discharge status: Home / Self Care | Payer: 59 | Source: Ambulatory Visit | Attending: Obstetrics & Gynecology | Admitting: Obstetrics & Gynecology

## 2016-12-21 ENCOUNTER — Encounter (HOSPITAL_COMMUNITY): Payer: Self-pay

## 2016-12-21 DIAGNOSIS — N83299 Other ovarian cyst, unspecified side: Secondary | ICD-10-CM | POA: Insufficient documentation

## 2016-12-21 DIAGNOSIS — Z01812 Encounter for preprocedural laboratory examination: Secondary | ICD-10-CM | POA: Diagnosis not present

## 2016-12-21 DIAGNOSIS — R9431 Abnormal electrocardiogram [ECG] [EKG]: Secondary | ICD-10-CM | POA: Diagnosis not present

## 2016-12-21 DIAGNOSIS — Z01810 Encounter for preprocedural cardiovascular examination: Secondary | ICD-10-CM | POA: Diagnosis not present

## 2016-12-21 LAB — URINALYSIS, ROUTINE W REFLEX MICROSCOPIC
BILIRUBIN URINE: NEGATIVE
Glucose, UA: NEGATIVE mg/dL
KETONES UR: NEGATIVE mg/dL
Nitrite: NEGATIVE
PROTEIN: NEGATIVE mg/dL
Specific Gravity, Urine: 1.005 (ref 1.005–1.030)
pH: 7 (ref 5.0–8.0)

## 2016-12-21 LAB — COMPREHENSIVE METABOLIC PANEL
ALBUMIN: 3.9 g/dL (ref 3.5–5.0)
ALK PHOS: 100 U/L (ref 38–126)
ALT: 18 U/L (ref 14–54)
ANION GAP: 6 (ref 5–15)
AST: 24 U/L (ref 15–41)
BILIRUBIN TOTAL: 0.3 mg/dL (ref 0.3–1.2)
BUN: 8 mg/dL (ref 6–20)
CALCIUM: 8.7 mg/dL — AB (ref 8.9–10.3)
CO2: 27 mmol/L (ref 22–32)
Chloride: 104 mmol/L (ref 101–111)
Creatinine, Ser: 0.67 mg/dL (ref 0.44–1.00)
GFR calc Af Amer: >60 mL/min (ref >60)
GLUCOSE: 107 mg/dL — AB (ref 65–99)
Potassium: 4.1 mmol/L (ref 3.5–5.1)
Sodium: 137 mmol/L (ref 135–145)
TOTAL PROTEIN: 7.8 g/dL (ref 6.5–8.1)

## 2016-12-21 LAB — CBC
HEMATOCRIT: 42.3 % (ref 36.0–46.0)
HEMOGLOBIN: 14.1 g/dL (ref 12.0–15.0)
MCH: 29.3 pg (ref 26.0–34.0)
MCHC: 33.3 g/dL (ref 30.0–36.0)
MCV: 87.9 fL (ref 78.0–100.0)
Platelets: 245 10*3/uL (ref 150–400)
RBC: 4.81 MIL/uL (ref 3.87–5.11)
RDW: 13.4 % (ref 11.5–15.5)
WBC: 6.3 10*3/uL (ref 4.0–10.5)

## 2016-12-21 NOTE — Progress Notes (Signed)
Urinalysis routed via epic to Dr Janie Morning

## 2016-12-22 LAB — URINE CULTURE

## 2016-12-22 LAB — ABO/RH: ABO/RH(D): B POS

## 2016-12-27 ENCOUNTER — Other Ambulatory Visit: Payer: Self-pay | Admitting: Gynecologic Oncology

## 2016-12-27 ENCOUNTER — Ambulatory Visit (HOSPITAL_COMMUNITY): Payer: 59 | Admitting: Anesthesiology

## 2016-12-27 ENCOUNTER — Encounter (HOSPITAL_COMMUNITY)
Admission: RE | Disposition: A | Discharge status: Home / Self Care | Payer: Self-pay | Source: Ambulatory Visit | Attending: Obstetrics & Gynecology

## 2016-12-27 ENCOUNTER — Ambulatory Visit (HOSPITAL_COMMUNITY)
Admission: RE | Admit: 2016-12-27 | Discharge: 2016-12-27 | Disposition: A | Discharge status: Home / Self Care | Payer: 59 | Source: Ambulatory Visit | Attending: Obstetrics & Gynecology | Admitting: Obstetrics & Gynecology

## 2016-12-27 ENCOUNTER — Encounter (HOSPITAL_COMMUNITY): Payer: Self-pay | Admitting: *Deleted

## 2016-12-27 DIAGNOSIS — F329 Major depressive disorder, single episode, unspecified: Secondary | ICD-10-CM | POA: Diagnosis not present

## 2016-12-27 DIAGNOSIS — R8569 Abnormal cytological findings in specimens from other digestive organs and abdominal cavity: Secondary | ICD-10-CM | POA: Diagnosis not present

## 2016-12-27 DIAGNOSIS — Z79899 Other long term (current) drug therapy: Secondary | ICD-10-CM | POA: Diagnosis not present

## 2016-12-27 DIAGNOSIS — N8311 Corpus luteum cyst of right ovary: Secondary | ICD-10-CM | POA: Insufficient documentation

## 2016-12-27 DIAGNOSIS — K219 Gastro-esophageal reflux disease without esophagitis: Secondary | ICD-10-CM | POA: Insufficient documentation

## 2016-12-27 DIAGNOSIS — N83299 Other ovarian cyst, unspecified side: Secondary | ICD-10-CM

## 2016-12-27 DIAGNOSIS — D287 Benign neoplasm of other specified female genital organs: Secondary | ICD-10-CM

## 2016-12-27 DIAGNOSIS — I471 Supraventricular tachycardia: Secondary | ICD-10-CM | POA: Insufficient documentation

## 2016-12-27 DIAGNOSIS — N83201 Unspecified ovarian cyst, right side: Secondary | ICD-10-CM | POA: Insufficient documentation

## 2016-12-27 DIAGNOSIS — R1909 Other intra-abdominal and pelvic swelling, mass and lump: Secondary | ICD-10-CM | POA: Insufficient documentation

## 2016-12-27 DIAGNOSIS — Z6836 Body mass index (BMI) 36.0-36.9, adult: Secondary | ICD-10-CM | POA: Insufficient documentation

## 2016-12-27 DIAGNOSIS — D509 Iron deficiency anemia, unspecified: Secondary | ICD-10-CM | POA: Diagnosis not present

## 2016-12-27 DIAGNOSIS — Z780 Asymptomatic menopausal state: Secondary | ICD-10-CM

## 2016-12-27 HISTORY — PX: ROBOTIC ASSISTED SALPINGO OOPHERECTOMY: SHX6082

## 2016-12-27 LAB — TYPE AND SCREEN
ABO/RH(D): B POS
Antibody Screen: NEGATIVE

## 2016-12-27 SURGERY — ROBOTIC ASSISTED SALPINGO OOPHORECTOMY
Anesthesia: General | Laterality: Right

## 2016-12-27 MED ORDER — OXYCODONE-ACETAMINOPHEN 5-325 MG PO TABS: 1.0000 | ORAL_TABLET | ORAL | 0 refills | Status: DC | PRN

## 2016-12-27 MED ORDER — MIDAZOLAM HCL 5 MG/5ML IJ SOLN
INTRAMUSCULAR | Status: DC | PRN
  Administered 2016-12-27: 2 mg via INTRAVENOUS

## 2016-12-27 MED ORDER — ONDANSETRON HCL 4 MG/2ML IJ SOLN
INTRAMUSCULAR | Status: AC
  Filled 2016-12-27: qty 2

## 2016-12-27 MED ORDER — CEFAZOLIN SODIUM-DEXTROSE 2-3 GM-% IV SOLR
INTRAVENOUS | Status: DC | PRN
  Administered 2016-12-27: 2 g via INTRAVENOUS

## 2016-12-27 MED ORDER — FENTANYL CITRATE (PF) 100 MCG/2ML IJ SOLN
INTRAMUSCULAR | Status: DC | PRN
  Administered 2016-12-27: 50 ug via INTRAVENOUS
  Administered 2016-12-27 (×2): 100 ug via INTRAVENOUS

## 2016-12-27 MED ORDER — OXYCODONE HCL 5 MG/5ML PO SOLN
5.0000 mg | Freq: Once | ORAL | Status: DC | PRN
  Filled 2016-12-27: qty 5

## 2016-12-27 MED ORDER — ACETAMINOPHEN 500 MG PO TABS: 1000.0000 mg | ORAL_TABLET | Freq: Four times a day (QID) | ORAL | Status: DC

## 2016-12-27 MED ORDER — ACETAMINOPHEN 650 MG RE SUPP: 650.0000 mg | RECTAL | Status: DC | PRN

## 2016-12-27 MED ORDER — DEXAMETHASONE SODIUM PHOSPHATE 10 MG/ML IJ SOLN
INTRAMUSCULAR | Status: AC
  Filled 2016-12-27: qty 1

## 2016-12-27 MED ORDER — DEXAMETHASONE SODIUM PHOSPHATE 10 MG/ML IJ SOLN
INTRAMUSCULAR | Status: DC | PRN
  Administered 2016-12-27: 10 mg via INTRAVENOUS

## 2016-12-27 MED ORDER — MIDAZOLAM HCL 2 MG/2ML IJ SOLN
INTRAMUSCULAR | Status: AC
  Filled 2016-12-27: qty 2

## 2016-12-27 MED ORDER — MORPHINE SULFATE (PF) 4 MG/ML IV SOLN: 2.0000 mg | INTRAVENOUS | Status: DC | PRN

## 2016-12-27 MED ORDER — SUGAMMADEX SODIUM 200 MG/2ML IV SOLN
INTRAVENOUS | Status: AC
  Filled 2016-12-27: qty 2

## 2016-12-27 MED ORDER — FENTANYL CITRATE (PF) 100 MCG/2ML IJ SOLN
INTRAMUSCULAR | Status: AC
  Filled 2016-12-27: qty 2

## 2016-12-27 MED ORDER — LIDOCAINE 2% (20 MG/ML) 5 ML SYRINGE
INTRAMUSCULAR | Status: AC
  Filled 2016-12-27: qty 5

## 2016-12-27 MED ORDER — ESTRADIOL 0.1 MG/24HR TD PTWK: 0.1000 mg | MEDICATED_PATCH | TRANSDERMAL | 12 refills | Status: DC

## 2016-12-27 MED ORDER — SODIUM CHLORIDE 0.9 % IV SOLN: 250.0000 mL | INTRAVENOUS | Status: DC | PRN

## 2016-12-27 MED ORDER — ROCURONIUM BROMIDE 100 MG/10ML IV SOLN
INTRAVENOUS | Status: DC | PRN
  Administered 2016-12-27: 50 mg via INTRAVENOUS

## 2016-12-27 MED ORDER — PROPOFOL 10 MG/ML IV BOLUS
INTRAVENOUS | Status: DC | PRN
  Administered 2016-12-27: 200 mg via INTRAVENOUS

## 2016-12-27 MED ORDER — FENTANYL CITRATE (PF) 100 MCG/2ML IJ SOLN
25.0000 ug | INTRAMUSCULAR | Status: DC | PRN
  Administered 2016-12-27: 50 ug via INTRAVENOUS

## 2016-12-27 MED ORDER — PROPOFOL 10 MG/ML IV BOLUS
INTRAVENOUS | Status: AC
  Filled 2016-12-27: qty 20

## 2016-12-27 MED ORDER — SUGAMMADEX SODIUM 200 MG/2ML IV SOLN
INTRAVENOUS | Status: DC | PRN
  Administered 2016-12-27: 200 mg via INTRAVENOUS

## 2016-12-27 MED ORDER — LACTATED RINGERS IR SOLN
Status: DC | PRN
  Administered 2016-12-27: 200 mL

## 2016-12-27 MED ORDER — ONDANSETRON HCL 4 MG/2ML IJ SOLN
INTRAMUSCULAR | Status: DC | PRN
  Administered 2016-12-27: 4 mg via INTRAVENOUS

## 2016-12-27 MED ORDER — OXYCODONE HCL 5 MG PO TABS: 5.0000 mg | ORAL_TABLET | Freq: Once | ORAL | Status: DC | PRN

## 2016-12-27 MED ORDER — ROCURONIUM BROMIDE 50 MG/5ML IV SOSY
PREFILLED_SYRINGE | INTRAVENOUS | Status: AC
  Filled 2016-12-27: qty 5

## 2016-12-27 MED ORDER — LACTATED RINGERS IV SOLN
INTRAVENOUS | Status: DC | PRN
  Administered 2016-12-27: 07:00:00 via INTRAVENOUS
  Administered 2016-12-27: 1000 mL

## 2016-12-27 MED ORDER — FENTANYL CITRATE (PF) 250 MCG/5ML IJ SOLN
INTRAMUSCULAR | Status: AC
  Filled 2016-12-27: qty 5

## 2016-12-27 MED ORDER — LIDOCAINE HCL (CARDIAC) 20 MG/ML IV SOLN
INTRAVENOUS | Status: DC | PRN
  Administered 2016-12-27: 100 mg via INTRAVENOUS

## 2016-12-27 MED ORDER — HEPARIN SODIUM (PORCINE) 5000 UNIT/ML IJ SOLN
5000.0000 [IU] | INTRAMUSCULAR | Status: AC
Start: 2016-12-27 — End: 2016-12-27
  Administered 2016-12-27: 5000 [IU] via SUBCUTANEOUS
  Filled 2016-12-27: qty 1

## 2016-12-27 MED ORDER — SODIUM CHLORIDE 0.9% FLUSH: 3.0000 mL | Freq: Two times a day (BID) | INTRAVENOUS | Status: DC

## 2016-12-27 MED ORDER — SODIUM CHLORIDE 0.9% FLUSH: 3.0000 mL | INTRAVENOUS | Status: DC | PRN

## 2016-12-27 MED ORDER — CEFAZOLIN SODIUM-DEXTROSE 2-4 GM/100ML-% IV SOLN
INTRAVENOUS | Status: AC
  Filled 2016-12-27: qty 100

## 2016-12-27 MED ORDER — KETOROLAC TROMETHAMINE 15 MG/ML IJ SOLN: 15.0000 mg | Freq: Four times a day (QID) | INTRAMUSCULAR | Status: DC

## 2016-12-27 MED ORDER — OXYCODONE HCL 5 MG PO TABS: 5.0000 mg | ORAL_TABLET | ORAL | Status: DC | PRN

## 2016-12-27 MED ORDER — ACETAMINOPHEN 325 MG PO TABS: 650.0000 mg | ORAL_TABLET | ORAL | Status: DC | PRN

## 2016-12-27 MED FILL — ESTRADIOL 0.1 MG/DAY PATCH: 0.1 | 28 days supply | Qty: 4 | Fill #0

## 2016-12-27 MED FILL — OXYCODONE W/APAP 5/325 TAB: 5-325 | 1 days supply | Qty: 10 | Fill #0

## 2016-12-27 SURGICAL SUPPLY — 73 items
ADH SKN CLS APL DERMABOND .7 (GAUZE/BANDAGES/DRESSINGS) ×2
ATTRACTOMAT 16X20 MAGNETIC DRP (DRAPES) ×1 IMPLANT
BAG SPEC RTRVL LRG 6X4 10 (ENDOMECHANICALS) ×2
BAG URINE DRAINAGE (UROLOGICAL SUPPLIES) ×3 IMPLANT
BLADE EXTENDED COATED 6.5IN (ELECTRODE) ×1 IMPLANT
CATH FOLEY 2WAY SLVR  5CC 16FR (CATHETERS)
CATH FOLEY 2WAY SLVR 5CC 16FR (CATHETERS) ×1 IMPLANT
CHLORAPREP W/TINT 26ML (MISCELLANEOUS) ×3 IMPLANT
CLIP VESOCCLUDE MED LG 6/CT (CLIP) ×2 IMPLANT
CONT SPEC 4OZ CLIKSEAL STRL BL (MISCELLANEOUS) ×1 IMPLANT
COVER BACK TABLE 60X90IN (DRAPES) ×1 IMPLANT
COVER SURGICAL LIGHT HANDLE (MISCELLANEOUS) ×1 IMPLANT
COVER TIP SHEARS 8 DVNC (MISCELLANEOUS) ×2 IMPLANT
COVER TIP SHEARS 8MM DA VINCI (MISCELLANEOUS) ×1
DECANTER SPIKE VIAL GLASS SM (MISCELLANEOUS) ×1 IMPLANT
DERMABOND ADVANCED (GAUZE/BANDAGES/DRESSINGS) ×1
DERMABOND ADVANCED .7 DNX12 (GAUZE/BANDAGES/DRESSINGS) ×2 IMPLANT
DRAPE ARM DVNC X/XI (DISPOSABLE) ×8 IMPLANT
DRAPE COLUMN DVNC XI (DISPOSABLE) ×2 IMPLANT
DRAPE DA VINCI XI ARM (DISPOSABLE) ×4
DRAPE DA VINCI XI COLUMN (DISPOSABLE) ×1
DRAPE SHEET LG 3/4 BI-LAMINATE (DRAPES) ×4 IMPLANT
DRAPE SURG IRRIG POUCH 19X23 (DRAPES) ×3 IMPLANT
DRAPE WARM FLUID 44X44 (DRAPE) ×1 IMPLANT
ELECT REM PT RETURN 15FT ADLT (MISCELLANEOUS) ×3 IMPLANT
GAUZE SPONGE 4X4 12PLY STRL (GAUZE/BANDAGES/DRESSINGS) ×1 IMPLANT
GAUZE SPONGE 4X4 16PLY XRAY LF (GAUZE/BANDAGES/DRESSINGS) IMPLANT
GLOVE BIO SURGEON STRL SZ 6.5 (GLOVE) ×12 IMPLANT
GLOVE BIO SURGEON STRL SZ7.5 (GLOVE) ×9 IMPLANT
GLOVE BIOGEL PI IND STRL 7.0 (GLOVE) ×2 IMPLANT
GLOVE BIOGEL PI IND STRL 8 (GLOVE) ×4 IMPLANT
GLOVE BIOGEL PI INDICATOR 7.0 (GLOVE) ×1
GLOVE BIOGEL PI INDICATOR 8 (GLOVE) ×2
GOWN STRL REUS W/ TWL LRG LVL3 (GOWN DISPOSABLE) ×2 IMPLANT
GOWN STRL REUS W/ TWL XL LVL3 (GOWN DISPOSABLE) ×4 IMPLANT
GOWN STRL REUS W/TWL LRG LVL3 (GOWN DISPOSABLE) ×3
GOWN STRL REUS W/TWL XL LVL3 (GOWN DISPOSABLE) ×6
HOLDER FOLEY CATH W/STRAP (MISCELLANEOUS) ×3 IMPLANT
IRRIG SUCT STRYKERFLOW 2 WTIP (MISCELLANEOUS) ×3
IRRIGATION SUCT STRKRFLW 2 WTP (MISCELLANEOUS) ×2 IMPLANT
KIT BASIN OR (CUSTOM PROCEDURE TRAY) ×3 IMPLANT
MANIPULATOR UTERINE 4.5 ZUMI (MISCELLANEOUS) IMPLANT
NDL HYPO 25X1 1.5 SAFETY (NEEDLE) ×1 IMPLANT
NEEDLE HYPO 25X1 1.5 SAFETY (NEEDLE) IMPLANT
NS IRRIG 1000ML POUR BTL (IV SOLUTION) ×4 IMPLANT
OCCLUDER COLPOPNEUMO (BALLOONS) IMPLANT
PACK GENERAL/GYN (CUSTOM PROCEDURE TRAY) ×1 IMPLANT
PACK ROBOT GYN CUSTOM WL (TRAY / TRAY PROCEDURE) ×3 IMPLANT
PAD POSITIONING PINK XL (MISCELLANEOUS) ×3 IMPLANT
PORT ACCESS TROCAR AIRSEAL 12 (TROCAR) ×2 IMPLANT
PORT ACCESS TROCAR AIRSEAL 5M (TROCAR) ×1
POUCH SPECIMEN RETRIEVAL 10MM (ENDOMECHANICALS) ×2 IMPLANT
SEAL CANN UNIV 5-8 DVNC XI (MISCELLANEOUS) ×8 IMPLANT
SEAL XI 5MM-8MM UNIVERSAL (MISCELLANEOUS) ×4
SET TRI-LUMEN FLTR TB AIRSEAL (TUBING) ×3 IMPLANT
SHEET LAVH (DRAPES) ×3 IMPLANT
SOL PREP PROV IODINE SCRUB 4OZ (MISCELLANEOUS) ×1 IMPLANT
SOLUTION ELECTROLUBE (MISCELLANEOUS) ×3 IMPLANT
SPONGE LAP 18X18 X RAY DECT (DISPOSABLE) ×2 IMPLANT
SUT MNCRL AB 4-0 PS2 18 (SUTURE) ×6 IMPLANT
SUT PDS AB 1 CTXB1 36 (SUTURE) ×2 IMPLANT
SUT VIC AB 0 CT1 27 (SUTURE)
SUT VIC AB 0 CT1 27XBRD ANTBC (SUTURE) IMPLANT
SUT VIC AB 0 CT1 36 (SUTURE) ×8 IMPLANT
SUT VIC AB 2-0 CT2 27 (SUTURE) IMPLANT
SUT VICRYL 2 0 18  UND BR (SUTURE) ×1
SUT VICRYL 2 0 18 UND BR (SUTURE) ×2 IMPLANT
SYR CONTROL 10ML LL (SYRINGE) ×1 IMPLANT
TOWEL OR 17X26 10 PK STRL BLUE (TOWEL DISPOSABLE) ×2 IMPLANT
TRAP SPECIMEN MUCOUS 40CC (MISCELLANEOUS) ×2 IMPLANT
TROCAR BLADELESS OPT 5 100 (ENDOMECHANICALS) ×1 IMPLANT
UNDERPAD 30X30 (UNDERPADS AND DIAPERS) ×3 IMPLANT
WATER STERILE IRR 1000ML POUR (IV SOLUTION) ×4 IMPLANT

## 2016-12-27 NOTE — Discharge Instructions (Addendum)
Unilateral Salpingo-Oophorectomy, Care After Refer to this sheet in the next few weeks. These instructions provide you with information on caring for yourself after your procedure. Your health care provider may also give you more specific instructions. Your treatment has been planned according to current medical practices, but problems sometimes occur. Call your health care provider if you have any problems or questions after your procedure. What can I expect after the procedure? After the procedure, it is typical to have the following:  Abdominal pain that can be controlled with pain medicine.  Vaginal spotting.  Constipation.  Follow these instructions at home:  Get plenty of rest and sleep.  Only take over-the-counter or prescription medicines as directed by your health care provider. Do not take aspirin. It can cause bleeding.  Keep incision areas clean and dry. Remove or change any bandages (dressings) only as directed by your health care provider.  Follow your health care provider's advice regarding diet.  Drink enough fluids to keep your urine clear or pale yellow.  Limit exercise and activities as directed by your health care provider. Do not lift anything heavier than 5 pounds (2.3 kg) until your health care provider approves.  Do not drive until your health care provider approves.  Do not drink alcohol until your health care provider approves.  Do not have sexual intercourse until your health care provider says it is OK.  Take your temperature twice a day and write it down.  If you become constipated, you may: ? Ask your health care provider about taking a mild laxative. ? Add more fruit and bran to your diet. ? Drink more fluids.  Follow up with your health care provider as directed. Contact a health care provider if:  You have swelling or redness in the incision area.  You develop a rash.  You feel lightheaded.  You have pain that is not controlled with  medicine.  You have pain, swelling, or redness where the IV access tube was placed. Get help right away if:  You have a fever.  You develop increasing abdominal pain.  You see pus coming out of the incision, or the incision is separating.  You notice a bad smell coming from the wound or dressing.  You have excessive vaginal bleeding.  You feel sick to your stomach (nauseous) and vomit.  You have leg or chest pain.  You have pain when you urinate.  You develop shortness of breath.  You pass out. This information is not intended to replace advice given to you by your health care provider. Make sure you discuss any questions you have with your health care provider. Document Released: 02/24/2009 Document Revised: 03/30/2016 Document Reviewed: 10/22/2012 Elsevier Interactive Patient Education  [employer].   Return to work: 2 weeks  Activity: 1. Be up and out of the bed during the day.  Take a nap if needed.  You may walk up steps but be careful and use the hand rail.  Stair climbing will tire you more than you think, you may need to stop part way and rest.   2. No lifting or straining for 6 weeks.  3. No driving for 1-2 weeks.  Do Not drive if you are taking narcotic pain medicine.  4. Shower daily.  Use soap and water on your incision and pat dry; don't rub.   5. No sexual activity and nothing in the vagina for 4 weeks.  Diet: 1. Low sodium Heart Healthy Diet is recommended.  2. It  is safe to use a laxative if you have difficulty moving your bowels.   Wound Care: 1. Keep clean and dry.  Shower daily.  Reasons to call the Doctor:   Fever - Oral temperature greater than 100.4 degrees Fahrenheit  Foul-smelling vaginal discharge  Difficulty urinating  Nausea and vomiting  Increased pain at the site of the incision that is unrelieved with pain medicine.  Difficulty breathing with or without chest pain  New calf pain especially if only on one  side  Sudden, continuing increased vaginal bleeding with or without clots.   Follow-up: 1. See Janie Morning, MD in 4 weeks.  Contacts: For questions or concerns you should contact:  Dr. Everitt Amber at [phone]  or at [place]   General Anesthesia, Adult, Care After These instructions provide you with information about caring for yourself after your procedure. Your health care provider may also give you more specific instructions. Your treatment has been planned according to current medical practices, but problems sometimes occur. Call your health care provider if you have any problems or questions after your procedure. What can I expect after the procedure? After the procedure, it is common to have:  Vomiting.  A sore throat.  Mental slowness.  It is common to feel:  Nauseous.  Cold or shivery.  Sleepy.  Tired.  Sore or achy, even in parts of your body where you did not have surgery.  Follow these instructions at home: For at least 24 hours after the procedure:  Do not: ? Participate in activities where you could fall or become injured. ? Drive. ? Use heavy machinery. ? Drink alcohol. ? Take sleeping pills or medicines that cause drowsiness. ? Make important decisions or sign legal documents. ? Take care of children on your own.  Rest. Eating and drinking  If you vomit, drink water, juice, or soup when you can drink without vomiting.  Drink enough fluid to keep your urine clear or pale yellow.  Make sure you have little or no nausea before eating solid foods.  Follow the diet recommended by your health care provider. General instructions  Have a responsible adult stay with you until you are awake and alert.  Return to your normal activities as told by your health care provider. Ask your health care provider what activities are safe for you.  Take over-the-counter and prescription medicines only as told by your health care  provider.  If you smoke, do not smoke without supervision.  Keep all follow-up visits as told by your health care provider. This is important. Contact a health care provider if:  You continue to have nausea or vomiting at home, and medicines are not helpful.  You cannot drink fluids or start eating again.  You cannot urinate after 8-12 hours.  You develop a skin rash.  You have fever.  You have increasing redness at the site of your procedure. Get help right away if:  You have difficulty breathing.  You have chest pain.  You have unexpected bleeding.  You feel that you are having a life-threatening or urgent problem. This information is not intended to replace advice given to you by your health care provider. Make sure you discuss any questions you have with your health care provider. Document Released: 08/06/2000 Document Revised: 10/03/2015 Document Reviewed: 04/14/2015 Elsevier Interactive Patient Education  [employer].

## 2016-12-27 NOTE — Anesthesia Preprocedure Evaluation (Addendum)
Anesthesia Evaluation  Patient identified by MRN, date of birth, ID band Patient awake    Reviewed: Allergy & Precautions, NPO status , Patient's Chart, lab work & pertinent test results, reviewed documented beta blocker date and time   History of Anesthesia Complications Negative for: history of anesthetic complications  Airway Mallampati: II  TM Distance: >3 FB Neck ROM: Full    Dental  (+) Teeth Intact   Pulmonary neg pulmonary ROS,    breath sounds clear to auscultation       Cardiovascular + dysrhythmias Supra Ventricular Tachycardia  Rhythm:Regular     Neuro/Psych PSYCHIATRIC DISORDERS Depression negative neurological ROS     GI/Hepatic Neg liver ROS, GERD  Controlled,  Endo/Other  Morbid obesity  Renal/GU negative Renal ROS     Musculoskeletal   Abdominal   Peds  Hematology negative hematology ROS (+)   Anesthesia Other Findings   Reproductive/Obstetrics                             Anesthesia Physical Anesthesia Plan  ASA: II  Anesthesia Plan: General   Post-op Pain Management:    Induction: Intravenous  PONV Risk Score and Plan: 3 and Ondansetron, Dexamethasone and Midazolam  Airway Management Planned: Oral ETT  Additional Equipment: None  Intra-op Plan:   Post-operative Plan: Extubation in OR  Informed Consent: I have reviewed the patients History and Physical, chart, labs and discussed the procedure including the risks, benefits and alternatives for the proposed anesthesia with the patient or authorized representative who has indicated his/her understanding and acceptance.   Dental advisory given  Plan Discussed with: CRNA and Surgeon  Anesthesia Plan Comments:         Anesthesia Quick Evaluation

## 2016-12-27 NOTE — Anesthesia Procedure Notes (Signed)
Procedure Name: Intubation Date/Time: 12/27/2016 7:53 AM Performed by: Glory Buff Pre-anesthesia Checklist: Patient identified, Emergency Drugs available, Suction available and Patient being monitored Patient Re-evaluated:Patient Re-evaluated prior to induction Oxygen Delivery Method: Circle system utilized Preoxygenation: Pre-oxygenation with 100% oxygen Induction Type: IV induction Ventilation: Mask ventilation without difficulty Laryngoscope Size: Miller and 3 Grade View: Grade I Tube type: Oral Tube size: 7.5 mm Number of attempts: 1 Airway Equipment and Method: Stylet and Oral airway Placement Confirmation: ETT inserted through vocal cords under direct vision,  positive ETCO2 and breath sounds checked- equal and bilateral Secured at: 21 cm Tube secured with: Tape Dental Injury: Teeth and Oropharynx as per pre-operative assessment

## 2016-12-27 NOTE — H&P (View-Only) (Signed)
Consult Note: Gyn-Onc  Consult was requested by Dr.Cousins for the evaluation of LARNA CAPELLE 41 y.o. female  CC: Right complex adnexal mass with solid components and papillary projections  Assessment/Plan:  Ms. VICKY SCHLEICH is a 41 y.o.  with a right complex adnexal mass. The concerning findings are the appearance of a solid lesion measuring 1.7 x 1.5 x 1.7 cm and the presence of papillary projections. The recommendation was for right salpingo-oophorectomy. She has a remote history of hysterectomy and left salpingo-oophorectomy.  As such a significant amount of time was spent discussing the consequences of early menopause. The differential diagnosis is inclusive of a cystadenofibroma, low malignant potential or other ovarian neoplasm including invasive disease.  I have reviewed my recommendation for the planned surgery of robotic-assisted right salpingo-oopherectomy with washings.  If malignancy is identified a procedure would be extended to include omentectomy, lymph node dissection, tumor debulking, possible appendectomy and other staging procedures. There is also the consideration that exploratory laparotomy, and bowel resection may be possible but not likely.  Risks discussed included infection, bleeding, damage to nearby organs including bowel, bladder, vessels, and ureters reviewed. We discussed postoperative risks including need for transfusion, infection, PE/ DVT, cardiac events, bowel obstruction and lymphedema. The plan is for placement of an estrogen patch after surgery.   The patient signed consent forms in the office. All of her questions were answered and the patient was comfortable with this plan of care.  ILLYANNA PETILLO  is aware that the procedure will be performed on December 27, 2016  HPI: Ms. ROREY HODGES is a 41 y.o.  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 The patient was in her usual state of health until approximately 1 month ago when she noticed the onset of intermittent right lower quadrant pain. The pain is described  as dull intermittent lasting for a few minutes. She denies radiation. There is no associated emesis or nausea.  Pelvic ultrasound on 11/19/2016 is notable for solid appearing mass along with a complex cyst with different fluid levels along with papillary projections. The solid-appearing mass measures 1.7 x 1.5 x 1.7 cm. Trace amount of fluid is noted around the right adnexa. The left adnexa and ovary is surgically absent. A CA-125 collected on November 20 2016 returned with a value of 10.0.  Ms. Cotta denies abdominal bloating change in appetite or early satiety. She denies changes in bowel or bladder habits there is no vaginal rectal bleeding or hematuria. She denies any family history of ovary breast or colon cancer.  Review of Systems:  Constitutional  Feels well,  Cardiovascular  No chest pain, shortness of breath, or edema  Pulmonary  No cough or wheeze.  Gastro Intestinal  No nausea, vomitting, or diarrhoea. No bright red blood per rectum, intermittent right lower quadrant abdominal pain as described in history of present illness, change in bowel movement, or constipation.  Genito Urinary  No frequency, urgency, dysuria, no vaginal bleeding Musculo Skeletal  No myalgia, arthralgia, joint swelling or pain  Neurologic  No weakness, numbness, change in gait,  Psychology  No depression, anxiety, insomnia.    Current Meds:  Outpatient Encounter Prescriptions as of 12/13/2016  Medication Sig  . amitriptyline (ELAVIL) 100 MG tablet Take 1 tablet (100 mg total) by mouth at bedtime.  Marland Kitchen ibuprofen (ADVIL,MOTRIN) 800 MG tablet Take 1 tablet (800 mg total) by mouth every 8 (  eight) hours as needed (mild pain).  . metoprolol succinate (TOPROL-XL) 50 MG 24 hr tablet TAKE 1 TABLET BY MOUTH DAILY.  . pantoprazole  (PROTONIX) 40 MG tablet Take 1 tablet (40 mg total) by mouth as needed.   No facility-administered encounter medications on file as of 12/13/2016.     Allergy: No Known Allergies  Social Hx:   Social History   Social History  . Marital status: Married    Spouse name: N/A  . Number of children: N/A  . Years of education: N/A   Occupational History  . Not on file.   Social History Main Topics  . Smoking status: Never Smoker  . Smokeless tobacco: Not on file  . Alcohol use 0.0 oz/week     Comment: socially  . Drug use: No  . Sexual activity: Not on file   Other Topics Concern  . Not on file   Social History Narrative  . No narrative on file    Past Surgical Hx:  Past Surgical History:  Procedure Laterality Date  . BILATERAL SALPINGECTOMY Bilateral 11/01/2014   Procedure: BILATERAL SALPINGECTOMY;  Surgeon: Servando Salina, MD;  Location: [facility];  Service: Gynecology;  Laterality: Bilateral;  . CESAREAN SECTION  11/22/2001  . LEFT OOPHORECTOMY  2002   Dr Garwin Brothers. 2/2 symptomatice fibroma  . LYSIS OF ADHESION N/A 11/01/2014   Procedure: LYSIS OF ADHESION;  Surgeon: Servando Salina, MD;  Location: [facility];  Service: Gynecology;  Laterality: N/A;  . 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 ROBOTIC ASSISTED TOTAL HYSTERECTOMY N/A 11/01/2014   Procedure: ROBOTIC ASSISTED TOTAL HYSTERECTOMY;  Surgeon: Servando Salina, MD;  Location: [facility];  Service: Gynecology;  Laterality: N/A;    Past Medical Hx:  Past Medical History:  Diagnosis Date  . Depression    History of Depression  . Dysrhythmia    tachycardia  . GERD (gastroesophageal reflux disease)    Tx with PPI  . Goiter 2006   Dr Altheimer. Dx with Autonomously functioning goiter during pregnancy. anti-TPO negative.   . Menorrhagia    Being tx by GYN  . Tachycardia    Required BB during pregnancy. Baseline 100-130 at rest    Past Gynecological History:Menarche at age 52 with irregular menses. History of  oral contraceptive pill use. History of hysterectomy in 2016 gravida 2 para 2 cesarean section 2  Patient's last menstrual period was 02/02/2013.  Family Hx:  Family History  Problem Relation Age of Onset  . Seizures Father   . Hypertension Mother   . Mental illness Brother   . Stickler syndrome Sister   . Bipolar disorder Mother   . Schizophrenia Brother   . ADD / ADHD Son     Vitals:  Last menstrual period 02/02/2013.  Physical Exam: BP 128/78 (BP Location: Left Arm, Patient Position: Sitting)   Pulse (!) 101   Temp 99.6 F (37.6 C) (Oral)   Resp 18   Ht 5\' 3"  (1.6 m)   Wt 202 lb 1.6 oz (91.7 kg)   LMP 02/02/2013   SpO2 99%   BMI 35.80 kg/m   WD in NAD Neck  Supple NROM, without any enlargements.  Lymph Node Survey No cervical supraclavicular or inguinal adenopathy Cardiovascular  Pulse normal rate, regularity and rhythm.   Lungs  Clear to auscultation bilaterally. Good air movement.  Skin  No rash/lesions/breakdown  Psychiatry  Alert and oriented appropriate mood affect speech and reasoning. Abdomen  Normoactive bowel sounds, abdomen soft, non-tender.  Surgical  sites intact without evidence of hernia.  no masses at laparoscopic sites  Back No CVA tenderness Genito Urinary  Vulva/vagina: Normal external female genitalia.  No lesions. No discharge or bleeding.  Bladder/urethra:  No lesions or masses  Vagina: no palpable masses   Adnexa: No palpable masses. No cul-de-sac nodularity Rectal  Good tone, no masses no cul de sac nodularity.  Extremities  No bilateral cyanosis, clubbing or edema.   Janie Morning, MD, PhD 12/13/2016, 9:56 AM

## 2016-12-27 NOTE — Transfer of Care (Signed)
Immediate Anesthesia Transfer of Care Note  Patient: Joy Contreras  Procedure(s) Performed: Procedure(s): ROBOTIC ASSISTED RIGHT OOPHORECTOMY AND PERITONEAL BIOPSY (Right)  Patient Location: PACU  Anesthesia Type:General  Level of Consciousness: awake, alert  and oriented  Airway & Oxygen Therapy: Patient Spontanous Breathing and Patient connected to face mask oxygen  Post-op Assessment: Report given to RN and Post -op Vital signs reviewed and stable  Post vital signs: Reviewed and stable  Last Vitals:  Vitals:   12/27/16 0531  BP: (!) 138/97  Pulse: 93  Resp: 16  Temp: 37.3 C  SpO2: 100%    Last Pain:  Vitals:   12/27/16 0531  TempSrc: Oral      Patients Stated Pain Goal: 4 (32/91/91 6606)  Complications: No apparent anesthesia complications

## 2016-12-27 NOTE — Anesthesia Postprocedure Evaluation (Signed)
Anesthesia Post Note  Patient: Joy Contreras  Procedure(s) Performed: Procedure(s) (LRB): ROBOTIC ASSISTED RIGHT OOPHORECTOMY AND PERITONEAL BIOPSY (Right)     Patient location during evaluation: PACU Anesthesia Type: General Level of consciousness: awake and alert Pain management: pain level controlled Vital Signs Assessment: post-procedure vital signs reviewed and stable Respiratory status: spontaneous breathing, nonlabored ventilation, respiratory function stable and patient connected to nasal cannula oxygen Cardiovascular status: blood pressure returned to baseline and stable Postop Assessment: no signs of nausea or vomiting Anesthetic complications: no    Last Vitals:  Vitals:   12/27/16 1113 12/27/16 1142  BP: (!) 117/93 131/80  Pulse: 91 92  Resp: 12 14  Temp: 37.2 C 37.2 C  SpO2: 100% 98%    Last Pain:  Vitals:   12/27/16 1142  TempSrc: Oral  PainSc: 1                  Argie Applegate

## 2016-12-27 NOTE — Op Note (Addendum)
Surgeon: Janie Morning   Assistants: Lahoma Crocker MD  Anesthesia: GET  Pre-operative Diagnosis: Right adnexal mass  Post-operative Diagnosis: Benign right adnexal mass,   Operation: Robotic-assisted laparoscopic right oophorectomy  peritoneal biopsies, pelvic washings  Surgeon: Janie Morning   Operative Findings:   1.  Surigcally absent uterus and left adnexa and right fallopian tube 2.  Normal bilateral adnexa. 3.  No evidence of metastatic disease. 4.  Scarring of the pelvic peritoneum - area sampled.     Procedure Details  The patient was seen in the Holding Room. The risks, benefits, complications, treatment options, and expected outcomes were discussed with the patient.  The patient concurred with the proposed plan, giving informed consent.  The site of surgery properly noted/marked. The patient was identified as Joy Contreras and the procedure verified as a Robotic-assisted right salpingo oophorectomy with possible staging. A Time Out was held and the above information confirmed.  After induction of anesthesia, the patient was draped and prepped in the usual sterile manner. Pt was placed in supine position after anesthesia.  Her arms were tucked to her side with all appropriate precautions.  The chest was secured to the table.  The patient was placed in the semi-lithotomy position in [id number].  She was prepped and draped in the usual sterile manner. The abdominal drape was placed after the CholoraPrep had been allowed to dry for 3 minutes.  The perineum was prepped with Betadine.  Foley catheter was placed.  A second time-out was performed.  OG tube placement was confirmed and to suction.   Procedure:  The patient was brought to the operating room where general anesthesia was administered with no complications.  The patient was placed in the dorsal lithotomy position in padded Allen stirrups.  The arms were tucked at the sides with gel pads protecting the elbows  and foam protecting the hands. The patient was then prepped.  A Foley was placed to gravity.sponge stick was placed in the vagina  The patient was then draped in the normal manner.  Next, a 10 mm skin incision was made at Palmer's point.  The 5 mm Optiview port and scope was used for direct entry.  Opening pressure was under 10 mm CO2.  The abdomen was insufflated and the findings were noted as above.   At this point and all points during the procedure, the patient's intra-abdominal pressure did not exceed 15 mmHg. Next, a 10 mm skin incision was made about 2 cm above the umbilicus and a right and left port was placed about 10 cm lateral to the umbilical  port on the right and left side.  A fourth arm was placed in the right lateral abdomen.  All ports were placed under direct visualization.  The patient was placed in steep Trendelenburg.  Bowel was away into the upper abdomen.  The robot was docked in the normal manner.  The upper abdomen was inspected and the findings were as noted above. The right anterior peritoneum was entered lateral to the IP ligament  and the ureter identified. The ovarian vessels were cauterized. The specimen was placed in a bag and removed from the assistant port. Pedicles were inspected and excellent hemostasis was achieved.   The pelvic peritoneum was biopsied at a site that appeared suspicious for endometriosis.    Irrigation was applied over the surgical sites used and excellent hemostasis was confirmed.  Intra-operative frozen section returned with benign findings.    At this point in the procedure  was completed.  Robotic instruments were removed under direct visulaization.  The robot was undocked. The 10 mm port was closed with Vicryl on a UR-5 needle and the fascia was closed with 0 Vicryl on a UR-5 needle.  The skin was closed with 4-0 Vicryl in a subcuticular manner.Dermabond was placed over the incision.   Sponge, lap and needle counts correct x 2.  The patient was taken to  the recovery room in stable condition.  The vagina was swabbed with  minimal bleeding noted.   All instrument and needle counts were correct x  3.   The patient was transferred to the recovery room in stable condition.  Estimated Blood Loss:  Min    Specimens: PATHOLOGY right ovary, pelvic washings, pelvic peritoneum         Complications:  None; patient tolerated the procedure well.         Disposition: PACU - hemodynamically stable.

## 2016-12-27 NOTE — Interval H&P Note (Signed)
History and Physical Interval Note:  12/27/2016 7:29 AM  Joy Contreras  has presented today for surgery, with the diagnosis of OVARIAN CYST  The various methods of treatment have been discussed with the patient and family. After consideration of risks, benefits and other options for treatment, the patient has consented to  Procedure(s): ROBOTIC ASSISTED R SALPINGO OOPHORECTOMY (Right) POSSIBLE OMENTECTOMY (N/A) POSSIBLE ROBOTIC PELVIC AND PARA-AORTIC LYMPH NODE DISSECTION AND STAGING (N/A) as a surgical intervention .  The patient's history has been reviewed, patient examined, no change in status, stable for surgery.  I have reviewed the patient's chart and labs.  Questions were answered to the patient's satisfaction.     [place], [facility]

## 2016-12-28 ENCOUNTER — Encounter: Payer: Self-pay | Admitting: Gynecologic Oncology

## 2016-12-28 ENCOUNTER — Telehealth: Payer: Self-pay | Admitting: *Deleted

## 2016-12-28 NOTE — Telephone Encounter (Signed)
Called and gave the patient the date/time of September 27th at 3:30pm for the post op appt. Patient requested a return to work note, message given to [facility] APP.

## 2016-12-31 DIAGNOSIS — H5213 Myopia, bilateral: Secondary | ICD-10-CM | POA: Diagnosis not present

## 2017-01-28 MED FILL — METOPROLOL SUCC ER 50 MG TA: 50 | 90 days supply | Qty: 90 | Fill #2

## 2017-02-05 ENCOUNTER — Telehealth: Payer: Self-pay | Admitting: *Deleted

## 2017-02-05 NOTE — Telephone Encounter (Signed)
Patient called and moved her appt to November 29th.

## 2017-02-07 ENCOUNTER — Ambulatory Visit: Payer: 59 | Admitting: Gynecologic Oncology

## 2017-02-08 MED FILL — ESTRADIOL 0.1 MG/DAY PATCH: 0.1 | 28 days supply | Qty: 4 | Fill #1

## 2017-02-21 ENCOUNTER — Other Ambulatory Visit: Payer: Self-pay | Admitting: Internal Medicine

## 2017-02-21 MED ORDER — CARVEDILOL 6.25 MG PO TABS: 6.2500 mg | ORAL_TABLET | Freq: Two times a day (BID) | ORAL | 3 refills | Status: DC

## 2017-02-21 MED FILL — CARVEDILOL 6.25 MG TABLET: 6.25 | 90 days supply | Qty: 180 | Fill #0

## 2017-03-07 ENCOUNTER — Ambulatory Visit: Payer: 59 | Attending: Gynecologic Oncology | Admitting: Gynecologic Oncology

## 2017-03-07 ENCOUNTER — Encounter: Payer: Self-pay | Admitting: Gynecologic Oncology

## 2017-03-07 VITALS — BP 146/98 | HR 83 | Temp 98.3°F | Resp 18 | Ht 63.0 in | Wt 202.8 lb

## 2017-03-07 DIAGNOSIS — Z9889 Other specified postprocedural states: Secondary | ICD-10-CM | POA: Diagnosis not present

## 2017-03-07 DIAGNOSIS — N83201 Unspecified ovarian cyst, right side: Secondary | ICD-10-CM

## 2017-03-07 DIAGNOSIS — Z820 Family history of epilepsy and other diseases of the nervous system: Secondary | ICD-10-CM | POA: Diagnosis not present

## 2017-03-07 DIAGNOSIS — Z90721 Acquired absence of ovaries, unilateral: Secondary | ICD-10-CM | POA: Insufficient documentation

## 2017-03-07 DIAGNOSIS — F329 Major depressive disorder, single episode, unspecified: Secondary | ICD-10-CM | POA: Insufficient documentation

## 2017-03-07 DIAGNOSIS — Z8249 Family history of ischemic heart disease and other diseases of the circulatory system: Secondary | ICD-10-CM | POA: Diagnosis not present

## 2017-03-07 DIAGNOSIS — Z818 Family history of other mental and behavioral disorders: Secondary | ICD-10-CM | POA: Insufficient documentation

## 2017-03-07 DIAGNOSIS — I10 Essential (primary) hypertension: Secondary | ICD-10-CM | POA: Diagnosis not present

## 2017-03-07 DIAGNOSIS — K219 Gastro-esophageal reflux disease without esophagitis: Secondary | ICD-10-CM | POA: Insufficient documentation

## 2017-03-07 DIAGNOSIS — Z90710 Acquired absence of both cervix and uterus: Secondary | ICD-10-CM | POA: Insufficient documentation

## 2017-03-07 DIAGNOSIS — N951 Menopausal and female climacteric states: Secondary | ICD-10-CM | POA: Insufficient documentation

## 2017-03-07 DIAGNOSIS — N838 Other noninflammatory disorders of ovary, fallopian tube and broad ligament: Secondary | ICD-10-CM | POA: Insufficient documentation

## 2017-03-07 NOTE — Progress Notes (Signed)
GYN ONCOLOGY OFFICE VISIT    Joy Contreras 41 y.o. female  CC: Right complex adnexal mass with solid components and papillary projections s/p right oophorectomy.  Assessment/Plan:  Joy Contreras is a 41 y.o.  with a right complex adnexal mass. The concerning findings are the appearance of a solid lesion measuring 1.7 x 1.5 x 1.7 cm and the presence of papillary projections. The recommendation was for right salpingo-oophorectomy. She has a remote history of hysterectomy and left salpingo-oophorectomy.  As such a significant amount of time was spent discussing the consequences of early menopause. The differential diagnosis is inclusive of a cystadenofibroma, low malignant potential or other ovarian neoplasm including invasive disease. On December 27, 2016 she underwent right oophorectomy.  Path was c/w functional cyst. Is on ERT.  Reports hot flushes if the patch is not in place  HTN On Metoprolol bid BP 154/101 Repeat 150/98  F/U with Dr. Lynnae January tomorrow   HPI: Joy Contreras is a 41 y.o.  gravida 2 para 2 who history is notable for a minimally invasive hysterectomy and left salpingo-oophorectomy in June 2016 for uterine leiomyomata. The patient was in her usual state of health until approximately 1 month ago when she noticed the onset of intermittent right lower quadrant pain. The pain is described  as dull intermittent lasting for a few minutes. She denies radiation. There is no associated emesis or nausea.  Pelvic ultrasound on 11/19/2016 is notable for solid appearing mass along with a complex cyst with different fluid levels along with papillary projections. The solid-appearing mass measures 1.7 x 1.5 x 1.7 cm. Trace amount of fluid is noted around the right adnexa. The left adnexa and ovary is surgically absent. A CA-125 collected on November 20 2016 returned with a value of 10.0.  Ms. Wanamaker denies abdominal bloating change in appetite or early satiety. She denies  changes in bowel or bladder habits there is no vaginal rectal bleeding or hematuria. She denies any family history of ovary breast or colon cancer.  12/27/2016 Allison right oophorectomy Path:  1. Ovary, right, cyst CORPUS LUTEAL CYST AND SEROUS CYSTS NEGATIVE FOR MALIGNANCY 2. Peritoneum, biopsy, pelvic BENIGN FIBROMUSCULAR TISSUE  Review of Systems:  Constitutional  Feels well, no HA, no chest pain, no SOB Cardiovascular  No chest pain, shortness of breath, or edema  Pulmonary  No cough or wheeze.  Gastro Intestinal  No nausea, vomitting, or diarrhoea.   Musculo Skeletal  No myalgia, arthralgia, joint swelling or pain  Neurologic  No weakness, numbness, change in gait, hot flushes occasionally. Psychology  No depression, anxiety, insomnia.    Social Hx:   Social History   Social History  . Marital status: Married    Spouse name: N/A  . Number of children: N/A  . Years of education: N/A   Occupational History  . Not on file.   Social History Main Topics  . Smoking status: Never Smoker  . Smokeless tobacco: Never Used  . Alcohol use 0.0 oz/week     Comment: occassionally   . Drug use: No  . Sexual activity: Not on file   Other Topics Concern  . Not on file   Social History Narrative  . No narrative on file    Past Surgical Hx:  Past Surgical History:  Procedure Laterality Date  . BILATERAL SALPINGECTOMY Bilateral 11/01/2014   Procedure: BILATERAL SALPINGECTOMY;  Surgeon: Servando Salina, MD;  Location: [facility];  Service: Gynecology;  Laterality: Bilateral;  . CESAREAN  SECTION  11/22/2001  . LEFT OOPHORECTOMY  2002   Dr Garwin Brothers. 2/2 symptomatice fibroma  . LYSIS OF ADHESION N/A 11/01/2014   Procedure: LYSIS OF ADHESION;  Surgeon: Servando Salina, MD;  Location: [facility];  Service: Gynecology;  Laterality: N/A;  . 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 ROBOTIC ASSISTED SALPINGO OOPHERECTOMY Right 12/27/2016   Procedure: ROBOTIC ASSISTED RIGHT  OOPHORECTOMY AND PERITONEAL BIOPSY;  Surgeon: Janie Morning, MD;  Location: [facility];  Service: Gynecology;  Laterality: Right;  . ROBOTIC ASSISTED TOTAL HYSTERECTOMY N/A 11/01/2014   Procedure: ROBOTIC ASSISTED TOTAL HYSTERECTOMY;  Surgeon: Servando Salina, MD;  Location: [facility];  Service: Gynecology;  Laterality: N/A;    Past Medical Hx:  Past Medical History:  Diagnosis Date  . Depression    History of Depression  . Dysrhythmia    tachycardia  . GERD (gastroesophageal reflux disease)    Tx with PPI  . Goiter 2006   Dr Altheimer. Dx with Autonomously functioning goiter during pregnancy. anti-TPO negative.   . Menorrhagia    Being tx by GYN  . Tachycardia    Required BB during pregnancy. Baseline 100-130 at rest    Past Gynecological History:Menarche at age 5 with irregular menses. History of oral contraceptive pill use. History of hysterectomy in 2016 gravida 2 para 2 cesarean section 2  Patient's last menstrual period was 02/02/2013.  Family Hx:  Family History  Problem Relation Age of Onset  . Seizures Father   . Hypertension Mother   . Bipolar disorder Mother   . Mental illness Brother   . Stickler syndrome Sister   . Schizophrenia Brother   . ADD / ADHD Son     Vitals:  Blood pressure (!) 154/101, pulse 83, temperature 98.3 F (36.8 C), temperature source Oral, resp. rate 18, height 5\' 3"  (1.6 m), weight 202 lb 12.8 oz (92 kg), last menstrual period 02/02/2013, SpO2 98 %.  Physical Exam: WD in NAD Neck  Supple NROM, without any enlargements.  Lymph Node Survey No cervical supraclavicular or inguinal adenopathy Cardiovascular  Pulse normal rate, regularity and rhythm.   Lungs  Clear to auscultation bilaterally. Good air movement.  Skin  No rash/lesions/breakdown  Psychiatry  Alert and oriented appropriate mood affect speech and reasoning. Abdomen  Normoactive bowel sounds, abdomen soft, non-tender. Surgical  sites intact without evidence of hernia.  no  masses at laparoscopic sites  Back Extremities  No bilateral cyanosis, clubbing or edema.   Janie Morning, MD, PhD 03/07/2017, 4:35 PM

## 2017-03-07 NOTE — Patient Instructions (Signed)
Follow up with Dr. Garwin Brothers.  Please call for any needs.

## 2017-03-14 MED FILL — AMITRIPTYLINE HCL 100 MG TA: 100 | 90 days supply | Qty: 90 | Fill #2

## 2017-03-20 MED FILL — ESTRADIOL 0.1 MG/DAY PATCH: 0.1 | 28 days supply | Qty: 4 | Fill #2

## 2017-04-03 DIAGNOSIS — N6323 Unspecified lump in the left breast, lower outer quadrant: Secondary | ICD-10-CM | POA: Diagnosis not present

## 2017-04-03 DIAGNOSIS — Z09 Encounter for follow-up examination after completed treatment for conditions other than malignant neoplasm: Secondary | ICD-10-CM | POA: Diagnosis not present

## 2017-05-23 MED FILL — CARVEDILOL 6.25 MG TABLET: 6.25 | 90 days supply | Qty: 180 | Fill #1

## 2017-05-23 MED FILL — ESTRADIOL 0.1 MG/DAY PATCH: 0.1 | 28 days supply | Qty: 4 | Fill #3

## 2017-08-27 MED FILL — ESTRADIOL 0.1 MG/DAY PATCH: 0.1 | 28 days supply | Qty: 4 | Fill #4

## 2017-10-02 DIAGNOSIS — N6321 Unspecified lump in the left breast, upper outer quadrant: Secondary | ICD-10-CM | POA: Diagnosis not present

## 2017-10-02 DIAGNOSIS — N6323 Unspecified lump in the left breast, lower outer quadrant: Secondary | ICD-10-CM | POA: Diagnosis not present

## 2017-10-08 ENCOUNTER — Telehealth: Payer: Self-pay | Admitting: *Deleted

## 2017-10-08 ENCOUNTER — Other Ambulatory Visit: Payer: Self-pay | Admitting: Internal Medicine

## 2017-10-08 MED ORDER — SULFAMETHOXAZOLE-TRIMETHOPRIM 400-80 MG PO TABS: ORAL_TABLET | ORAL | 0 refills | Status: DC

## 2017-10-08 MED ORDER — SULFAMETHOXAZOLE-TRIMETHOPRIM 800-160 MG PO TABS: 1.0000 | ORAL_TABLET | Freq: Two times a day (BID) | ORAL | 0 refills | Status: DC

## 2017-10-08 MED FILL — SULFAMETHOXAZOLE-TMP DS TAB: 800-160 | 3 days supply | Qty: 6 | Fill #0

## 2017-10-08 MED FILL — SULFAMETHOXAZOLE-TMP SS TAB: 400-80 | 60 days supply | Qty: 30 | Fill #0

## 2017-10-08 NOTE — Telephone Encounter (Signed)
Spoke to [place]. This is her typical UTI sxs. Did well with bactrim 05/2015. Will repeat. Sex is trigger for UTI. Prophylaxis is indicated with > 2 UTI per 6 months. Will check on frequency of infxn. Non pharmaceutical tx preferred but only relevant one is large vol intake. Will start with 1/2 tab of SS bactrim post coital.

## 2017-10-08 NOTE — Telephone Encounter (Signed)
Pt states for 1 week her urine has odor, very cloudy- cant see thru it- frequency,also has h/a,  no fever, dip showed leuks. Can you call something in, she states how she always does w/ uti

## 2017-10-14 MED FILL — ESTRADIOL 0.1 MG/DAY PATCH: 0.1 | 28 days supply | Qty: 4 | Fill #5

## 2017-10-31 ENCOUNTER — Ambulatory Visit (INDEPENDENT_AMBULATORY_CARE_PROVIDER_SITE_OTHER): Payer: 59 | Admitting: Internal Medicine

## 2017-10-31 VITALS — BP 125/83 | HR 112 | Temp 98.6°F | Wt 200.5 lb

## 2017-10-31 DIAGNOSIS — Z79890 Hormone replacement therapy (postmenopausal): Secondary | ICD-10-CM

## 2017-10-31 DIAGNOSIS — Z0000 Encounter for general adult medical examination without abnormal findings: Secondary | ICD-10-CM

## 2017-10-31 DIAGNOSIS — E010 Iodine-deficiency related diffuse (endemic) goiter: Secondary | ICD-10-CM | POA: Diagnosis not present

## 2017-10-31 DIAGNOSIS — Z90710 Acquired absence of both cervix and uterus: Secondary | ICD-10-CM | POA: Diagnosis not present

## 2017-10-31 DIAGNOSIS — Z90722 Acquired absence of ovaries, bilateral: Secondary | ICD-10-CM | POA: Diagnosis not present

## 2017-10-31 DIAGNOSIS — G4700 Insomnia, unspecified: Secondary | ICD-10-CM

## 2017-10-31 DIAGNOSIS — Z1159 Encounter for screening for other viral diseases: Secondary | ICD-10-CM

## 2017-10-31 DIAGNOSIS — R000 Tachycardia, unspecified: Secondary | ICD-10-CM | POA: Diagnosis not present

## 2017-10-31 DIAGNOSIS — K219 Gastro-esophageal reflux disease without esophagitis: Secondary | ICD-10-CM

## 2017-10-31 DIAGNOSIS — Z9079 Acquired absence of other genital organ(s): Secondary | ICD-10-CM

## 2017-10-31 DIAGNOSIS — Z79899 Other long term (current) drug therapy: Secondary | ICD-10-CM | POA: Diagnosis not present

## 2017-10-31 DIAGNOSIS — R5382 Chronic fatigue, unspecified: Secondary | ICD-10-CM | POA: Diagnosis not present

## 2017-10-31 DIAGNOSIS — R002 Palpitations: Secondary | ICD-10-CM

## 2017-10-31 DIAGNOSIS — D509 Iron deficiency anemia, unspecified: Secondary | ICD-10-CM | POA: Diagnosis not present

## 2017-10-31 MED ORDER — CARVEDILOL 12.5 MG PO TABS
12.5000 mg | ORAL_TABLET | Freq: Two times a day (BID) | ORAL | 3 refills | Status: DC
Start: 2017-10-31 — End: 2018-12-18

## 2017-10-31 NOTE — Assessment & Plan Note (Addendum)
This problem is chronic and stable. Etiology not known. No hyperthyroidism, no longer anemic, no unstable med issues. On Coreg 6.25 BID.  She has no dyspnea or chest pain but does notice tachycardia and a different sensation which is more consistent with palpitations that last 2 to 3 minutes at a time.  These are intermittent but have become more frequently recently.  Long-standing severe tachycardia could cause heart failure but none of the symptoms are consistent with this.  My biggest concern right now is that she does not just have sinus tachycardia but has an arrhythmia.  We will get an event monitor to see if we can capture these episodes on telemetry.  I am also going to repeat a TSH and a T4 to ensure I do not know central hypothyroidism.  PLAN : increase Coreg 12.5 mg twice daily  Event monitor

## 2017-10-31 NOTE — Assessment & Plan Note (Addendum)
This problem is chronic and stable. On elavil 100 QHS. Tries to take before 9 PM and then will sleep at 11 or 11:30. Up at 6 AM. If doesn't take, can't sleep until 1-2 AM. Did not do well with Ambien yrs ago. Prefers to stay on elavil 100.  PLAN:  Cont current meds

## 2017-10-31 NOTE — Assessment & Plan Note (Signed)
No longer requires pap as s/p TAH with removal of cervix.

## 2017-10-31 NOTE — Progress Notes (Signed)
   Subjective:    Patient ID: Joy Contreras, female    DOB: 1975-05-15, 42 y.o.   MRN: [id number]  HPI  Joy Contreras is here for insomnia F/U. Please see the A&P for the status of the pt's chronic medical problems.  ROS : per ROS section and in problem oriented charting. All other systems are negative.  PMHx, Soc hx, and / or Fam hx : family didn't start at Y but did start walking with daughter when she had a race.  Review of Systems  Constitutional: Positive for fatigue.  Respiratory: Negative for shortness of breath.   Cardiovascular: Positive for palpitations and leg swelling. Negative for chest pain.  Psychiatric/Behavioral: Positive for sleep disturbance.       Forgetfulness        Objective:   Physical Exam  Constitutional: She appears well-developed and well-nourished. No distress.  HENT:  Head: Normocephalic and atraumatic.  Right Ear: External ear normal.  Left Ear: External ear normal.  Nose: Nose normal.  Eyes: Conjunctivae and EOM are normal. Right eye exhibits no discharge. Left eye exhibits no discharge.  Neck: Normal range of motion. Neck supple. Thyromegaly present.  R lobe enlargement  Cardiovascular: Regular rhythm.  No murmur heard. Pulmonary/Chest: Effort normal and breath sounds normal. No respiratory distress.  Musculoskeletal: She exhibits no edema, tenderness or deformity.  Lymphadenopathy:    She has no cervical adenopathy.  Neurological: She is alert.  Skin: Skin is warm and dry. No rash noted. She is not diaphoretic. No erythema. No pallor.  Psychiatric: She has a normal mood and affect. Her behavior is normal. Judgment and thought content normal.       Assessment & Plan:

## 2017-10-31 NOTE — Assessment & Plan Note (Addendum)
This problem is chronic and stable. HgB trend 10 - 14 (2017) - 14 (2018). Most recent ferritin 42 (07/2015) up from 10. Had received one IV iron infusion. Now s/p TAH so doubt any ongoing blood / iron loss. No sxs other than fatigue which may be explained by chronic lack of sleep.  PLAN : follow

## 2017-10-31 NOTE — Assessment & Plan Note (Signed)
This problem is chronic and stable.  Her TSH was normal in 2017 and I am repeating that today along with a free T4.  She has insomnia that she is treating with Elavil 100 mg.  I think the biggest issue is an overall lack of sleep, most nights are less than 7 hours which is likely an adequate.  She is noticing recently that her memory is not as sharp as it used to be.  PLAN : Will work on improved sleep hygiene Continue Elavil TSH and free T4

## 2017-10-31 NOTE — Assessment & Plan Note (Addendum)
This problem is chronic and stable. She is on protonix QD but takes it only occasionally.  Does continue to have acid reflux with regurgitation of stomach contents into her throat, particularly when she bends over.  She is going to try to take the PPI daily and see if this decreases her symptoms.  PLAN:  Cont current meds

## 2017-10-31 NOTE — Assessment & Plan Note (Addendum)
I reviewed Dr Cousin's notes. No s/p TAH and BSO. On estradiol patch.  She has difficulty making the patch adhere properly and will be speaking to her OB/GYN in July about transitioning to an oral estrogen supplementation.  She will not need any more cervical cancer screening as she no longer has a cervix and her hysterectomy was for nonmalignant reasons.  PLAN : Follow OB GYN notes

## 2017-11-01 LAB — TSH: TSH: 0.779 u[IU]/mL (ref 0.450–4.500)

## 2017-11-01 LAB — HIV ANTIBODY (ROUTINE TESTING W REFLEX): HIV SCREEN 4TH GENERATION: NONREACTIVE

## 2017-11-01 LAB — T4, FREE: Free T4: 0.93 ng/dL (ref 0.82–1.77)

## 2017-11-12 MED FILL — CARVEDILOL 12.5 MG TABLET: 12.5 | 90 days supply | Qty: 180 | Fill #0

## 2017-11-20 ENCOUNTER — Ambulatory Visit (INDEPENDENT_AMBULATORY_CARE_PROVIDER_SITE_OTHER): Payer: 59

## 2017-11-20 DIAGNOSIS — R000 Tachycardia, unspecified: Secondary | ICD-10-CM | POA: Diagnosis not present

## 2017-11-20 DIAGNOSIS — R002 Palpitations: Secondary | ICD-10-CM | POA: Diagnosis not present

## 2017-11-23 DIAGNOSIS — R002 Palpitations: Secondary | ICD-10-CM | POA: Diagnosis not present

## 2017-11-26 DIAGNOSIS — Z6835 Body mass index (BMI) 35.0-35.9, adult: Secondary | ICD-10-CM | POA: Diagnosis not present

## 2017-11-26 DIAGNOSIS — Z01419 Encounter for gynecological examination (general) (routine) without abnormal findings: Secondary | ICD-10-CM | POA: Diagnosis not present

## 2017-12-02 MED FILL — ESTRADIOL 0.1 MG/DAY PATCH: 0.1 | 28 days supply | Qty: 4 | Fill #0

## 2018-01-20 ENCOUNTER — Other Ambulatory Visit: Payer: Self-pay | Admitting: Internal Medicine

## 2018-01-20 DIAGNOSIS — R5383 Other fatigue: Secondary | ICD-10-CM

## 2018-01-20 MED FILL — ESTRADIOL 0.1 MG/DAY PATCH: 0.1 | 28 days supply | Qty: 4 | Fill #1

## 2018-01-21 MED FILL — AMITRIPTYLINE HCL 100 MG TA: 100 | 90 days supply | Qty: 90 | Fill #0

## 2018-02-19 MED FILL — ESTRADIOL 0.1 MG/DAY PATCH: 0.1 | 28 days supply | Qty: 4 | Fill #2

## 2018-03-21 ENCOUNTER — Ambulatory Visit: Payer: 59 | Admitting: Podiatry

## 2018-04-01 MED FILL — ESTRADIOL 0.1 MG/DAY PATCH: 0.1 | 84 days supply | Qty: 12 | Fill #3

## 2018-04-16 DIAGNOSIS — H52223 Regular astigmatism, bilateral: Secondary | ICD-10-CM | POA: Diagnosis not present

## 2018-04-16 DIAGNOSIS — H5213 Myopia, bilateral: Secondary | ICD-10-CM | POA: Diagnosis not present

## 2018-06-12 MED FILL — CARVEDILOL 12.5 MG TABLET: 12.5 | 90 days supply | Qty: 180 | Fill #1

## 2018-08-05 ENCOUNTER — Telehealth: Payer: 59 | Admitting: Internal Medicine

## 2018-08-11 MED FILL — ESTRADIOL 0.1 MG/DAY PATCH: 0.1 | 84 days supply | Qty: 12 | Fill #0

## 2018-08-11 MED FILL — AMITRIPTYLINE HCL 100 MG TA: 100 | 90 days supply | Qty: 90 | Fill #0

## 2018-10-28 DIAGNOSIS — L739 Follicular disorder, unspecified: Secondary | ICD-10-CM | POA: Diagnosis not present

## 2018-10-28 MED FILL — CLOBETASOL PROP 0.05% FOAM: 0.05 | 30 days supply | Qty: 100 | Fill #0

## 2018-12-18 ENCOUNTER — Encounter: Payer: Self-pay | Admitting: Internal Medicine

## 2018-12-18 ENCOUNTER — Other Ambulatory Visit: Payer: Self-pay

## 2018-12-18 ENCOUNTER — Ambulatory Visit (INDEPENDENT_AMBULATORY_CARE_PROVIDER_SITE_OTHER): Payer: 59 | Admitting: Internal Medicine

## 2018-12-18 VITALS — BP 150/89 | HR 106 | Temp 98.6°F | Wt 212.0 lb

## 2018-12-18 DIAGNOSIS — R5383 Other fatigue: Secondary | ICD-10-CM

## 2018-12-18 DIAGNOSIS — Z79899 Other long term (current) drug therapy: Secondary | ICD-10-CM

## 2018-12-18 DIAGNOSIS — K219 Gastro-esophageal reflux disease without esophagitis: Secondary | ICD-10-CM

## 2018-12-18 DIAGNOSIS — G4700 Insomnia, unspecified: Secondary | ICD-10-CM

## 2018-12-18 DIAGNOSIS — R000 Tachycardia, unspecified: Secondary | ICD-10-CM

## 2018-12-18 DIAGNOSIS — Z862 Personal history of diseases of the blood and blood-forming organs and certain disorders involving the immune mechanism: Secondary | ICD-10-CM | POA: Diagnosis not present

## 2018-12-18 DIAGNOSIS — Z90710 Acquired absence of both cervix and uterus: Secondary | ICD-10-CM

## 2018-12-18 DIAGNOSIS — D509 Iron deficiency anemia, unspecified: Secondary | ICD-10-CM

## 2018-12-18 MED ORDER — PANTOPRAZOLE SODIUM 40 MG PO TBEC: 40.0000 mg | DELAYED_RELEASE_TABLET | ORAL | 3 refills | Status: DC | PRN

## 2018-12-18 MED ORDER — CARVEDILOL 12.5 MG PO TABS: 12.5000 mg | ORAL_TABLET | Freq: Two times a day (BID) | ORAL | 3 refills | Status: DC

## 2018-12-18 MED ORDER — SULFAMETHOXAZOLE-TRIMETHOPRIM 400-80 MG PO TABS: ORAL_TABLET | ORAL | 0 refills | Status: DC

## 2018-12-18 MED ORDER — AMITRIPTYLINE HCL 100 MG PO TABS: 100.0000 mg | ORAL_TABLET | Freq: Every day | ORAL | 3 refills | Status: DC

## 2018-12-18 MED ORDER — LIRAGLUTIDE 18 MG/3ML ~~LOC~~ SOPN: PEN_INJECTOR | SUBCUTANEOUS | 0 refills | Status: DC

## 2018-12-18 MED FILL — PANTOPRAZOLE SOD DR 40 MG T: 40 | 90 days supply | Qty: 90 | Fill #0

## 2018-12-18 MED FILL — CARVEDILOL 12.5 MG TABLET: 12.5 | 90 days supply | Qty: 180 | Fill #0

## 2018-12-18 MED FILL — SULFAMETHOXAZOLE-TMP SS TAB: 400-80 | 15 days supply | Qty: 30 | Fill #0

## 2018-12-18 MED FILL — AMITRIPTYLINE HCL 100 MG TA: 100 | 90 days supply | Qty: 90 | Fill #0

## 2018-12-18 NOTE — Assessment & Plan Note (Signed)
This problem is chronic and uncontrolled.  She continues to have fatigue and no energy.  She is getting about 7 hours of sleep at night and using the amitriptyline to help sleep.  Her gynecologist had referred her for a sleep study but she decided not to pursue it.  Her TSH was normal last year.  PLAN:  Cont current meds

## 2018-12-18 NOTE — Assessment & Plan Note (Signed)
This problem is chronic and stable.  She uses Protonix as needed when she gets heartburn symptoms.  PLAN:  Cont current meds

## 2018-12-18 NOTE — Assessment & Plan Note (Signed)
This problem is chronic and uncontrolled.  TSH and free T4 a year ago were normal.  Her iron deficiency anemia has been treated.  She is a non-smoker.  She has no chronic pain or anxiety but she is sleep deprived.  She is on Coreg 12.5 twice daily and remains tachycardic.  She wore an event monitor last year that did not show any heart rate lower than 100 and she did not have nocturnal dipping of her heart rate which means she would be at higher risk of developing a tachycardia induced cardiomyopathy.  Her EKG from 2018 showed left axis deviation and possible LAE.  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  Therefore, I am recommending an echocardiogram and a referral to cardiology for a cardiologist opinion.  PLAN : Recommending echo and cardiology referral

## 2018-12-18 NOTE — Progress Notes (Signed)
   Subjective:    Patient ID: Joy Contreras, female    DOB: 1976-05-11, 43 y.o.   MRN: [id number]  HPI  Joy Contreras is here for fatigue F/U. Please see the A&P for the status of the pt's chronic medical problems.  ROS : per ROS section and in problem oriented charting. All other systems are negative.  PMHx, Soc hx, and / or Fam hx : works as Marine scientist in [employer]. Mother has DM.  Review of Systems  Constitutional: Positive for fatigue. Negative for activity change and appetite change.  Cardiovascular:       + Tachycardia  Gastrointestinal:       Occasional heartburn  Psychiatric/Behavioral: Positive for sleep disturbance.       Objective:   Physical Exam Constitutional:      General: She is not in acute distress.    Appearance: Normal appearance. She is not ill-appearing, toxic-appearing or diaphoretic.  HENT:     Head: Normocephalic and atraumatic.     Right Ear: External ear normal.     Left Ear: External ear normal.     Nose: Nose normal. No congestion or rhinorrhea.  Eyes:     General: No scleral icterus.       Right eye: No discharge.        Left eye: No discharge.     Extraocular Movements: Extraocular movements intact.     Conjunctiva/sclera: Conjunctivae normal.  Neck:     Musculoskeletal: Normal range of motion and neck supple. No muscular tenderness.     Comments: No thyromegaly or nodules Cardiovascular:     Rate and Rhythm: Regular rhythm. Tachycardia present.     Heart sounds: No murmur.  Pulmonary:     Effort: Pulmonary effort is normal. No respiratory distress.     Breath sounds: Normal breath sounds. No rhonchi or rales.  Musculoskeletal:        General: No tenderness or deformity.  Lymphadenopathy:     Cervical: No cervical adenopathy.  Skin:    General: Skin is warm and dry.  Neurological:     General: No focal deficit present.     Mental Status: She is alert. Mental status is at baseline.  Psychiatric:        Mood and Affect: Mood normal.         Behavior: Behavior normal.        Thought Content: Thought content normal.        Judgment: Judgment normal.           Assessment & Plan:

## 2018-12-18 NOTE — Assessment & Plan Note (Signed)
This problem is chronic and stable.  Her most recent hemoglobin was in August 2018 and was 14.1 with a normal MCV.  Her most recent ferritin was in 2017 and it had steadily increased and was 42 at that visit.  Since she had a TAH and no further blood loss, I would expect her iron to have only increased.  I discussed that there is no urgent indication to get additional blood work today.  PLAN : Follow

## 2018-12-19 ENCOUNTER — Other Ambulatory Visit: Payer: Self-pay | Admitting: Dietician

## 2018-12-19 ENCOUNTER — Telehealth: Payer: Self-pay | Admitting: *Deleted

## 2018-12-19 MED ORDER — INSULIN PEN NEEDLE 32G X 4 MM MISC: 11 refills | Status: DC

## 2018-12-19 NOTE — Telephone Encounter (Addendum)
Information sent through CoverMyMeds for [place] for Saxenda.  Awaiting determination.  Sander Nephew, RN  12/19/2018 11:35 AM. Fax from [facility] approval 12/23/2018 thru 04/23/2019 for 4 fills.  Must lose 4% of baseline body weight after 4 months of treatment.  Sander Nephew, RN 12/26/2018 9:15 AM.

## 2018-12-19 NOTE — Telephone Encounter (Signed)
Ms. Joy Contreras requests a prescription for pen needles

## 2018-12-22 NOTE — Addendum Note (Signed)
Addended by: Larey Dresser A on: 12/22/2018 08:51 AM   Modules accepted: Orders

## 2018-12-24 MED FILL — SAXENDA 18 MG/3 ML PEN: 18 | 30 days supply | Qty: 15 | Fill #0

## 2018-12-29 MED FILL — UNIFINE PENTIPS 32GX5/32: 32G X 4 MM | 90 days supply | Qty: 100 | Fill #0

## 2019-01-02 ENCOUNTER — Ambulatory Visit (HOSPITAL_COMMUNITY)
Admission: RE | Admit: 2019-01-02 | Discharge: 2019-01-02 | Disposition: A | Discharge status: Home / Self Care | Payer: 59 | Source: Ambulatory Visit | Attending: Internal Medicine | Admitting: Internal Medicine

## 2019-01-02 ENCOUNTER — Other Ambulatory Visit: Payer: Self-pay

## 2019-01-02 DIAGNOSIS — R000 Tachycardia, unspecified: Secondary | ICD-10-CM | POA: Diagnosis not present

## 2019-01-02 DIAGNOSIS — R9431 Abnormal electrocardiogram [ECG] [EKG]: Secondary | ICD-10-CM | POA: Insufficient documentation

## 2019-01-02 DIAGNOSIS — I371 Nonrheumatic pulmonary valve insufficiency: Secondary | ICD-10-CM | POA: Insufficient documentation

## 2019-01-02 DIAGNOSIS — I071 Rheumatic tricuspid insufficiency: Secondary | ICD-10-CM | POA: Insufficient documentation

## 2019-01-02 NOTE — Progress Notes (Signed)
  Echocardiogram 2D Echocardiogram has been performed.  Joy Contreras 01/02/2019, 1:48 PM

## 2019-01-26 ENCOUNTER — Other Ambulatory Visit: Payer: Self-pay | Admitting: Internal Medicine

## 2019-01-27 MED FILL — SAXENDA 18 MG/3 ML PEN: 18 | 30 days supply | Qty: 15 | Fill #0

## 2019-01-27 NOTE — Progress Notes (Signed)
CARDIOLOGY CONSULT NOTE       Patient ID: Joy Contreras MRN: [id number] DOB/AGE: March 01, 1976 43 y.o.  Admit date: (Not on file) Referring Physician: Lynnae January Primary Physician: Bartholomew Crews, MD Primary Cardiologist: New Reason for Consultation: Tachycardia  Active Problems:   * No active hospital problems. *   HPI:  43 y.o. referred by Dr Lynnae January for tachycardia. This has been known issue for over a year. She has insomnia and previous iron deficiency anemia that is improved. She has had normal thyroid studies. She is on coreg 12.5 bid. Holter 2019 showed HR;s always over 100 with no nocturnal dip.Also noted some PvC;s  Echo done 01/02/19 with EF 45-50% mild LVE/LVH  , GLS abnormal -9.1 and MAC   ROS All other systems reviewed and negative except as noted above  Past Medical History:  Diagnosis Date  . Depression    History of Depression  . Dysrhythmia    tachycardia  . GERD (gastroesophageal reflux disease)    Tx with PPI  . Goiter 2006   Dr Altheimer. Dx with Autonomously functioning goiter during pregnancy. anti-TPO negative.   . Menorrhagia    Being tx by GYN  . Tachycardia    Required BB during pregnancy. Baseline 100-130 at rest    Family History  Problem Relation Age of Onset  . Seizures Father   . Hypertension Mother   . Bipolar disorder Mother   . Mental illness Brother   . Stickler syndrome Sister   . Schizophrenia Brother   . ADD / ADHD Son     Social History   Socioeconomic History  . Marital status: Married    Spouse name: Not on file  . Number of children: Not on file  . Years of education: Not on file  . Highest education level: Not on file  Occupational History  . Not on file  Social Needs  . Financial resource strain: Not on file  . Food insecurity    Worry: Not on file    Inability: Not on file  . Transportation needs    Medical: Not on file    Non-medical: Not on file  Tobacco Use  . Smoking status: Never Smoker  .  Smokeless tobacco: Never Used  Substance and Sexual Activity  . Alcohol use: Yes    Alcohol/week: 0.0 standard drinks    Comment: occassionally   . Drug use: No  . Sexual activity: Not on file  Lifestyle  . Physical activity    Days per week: Not on file    Minutes per session: Not on file  . Stress: Not on file  Relationships  . Social Herbalist on phone: Not on file    Gets together: Not on file    Attends religious service: Not on file    Active member of club or organization: Not on file    Attends meetings of clubs or organizations: Not on file    Relationship status: Not on file  . Intimate partner violence    Fear of current or ex partner: Not on file    Emotionally abused: Not on file    Physically abused: Not on file    Forced sexual activity: Not on file  Other Topics Concern  . Not on file  Social History Narrative  . Not on file    Past Surgical History:  Procedure Laterality Date  . BILATERAL SALPINGECTOMY Bilateral 11/01/2014   Procedure: BILATERAL SALPINGECTOMY;  Surgeon: Servando Salina, MD;  Location: [facility];  Service: Gynecology;  Laterality: Bilateral;  . CESAREAN SECTION  11/22/2001  . LEFT OOPHORECTOMY  2002   Dr Garwin Brothers. 2/2 symptomatice fibroma  . LYSIS OF ADHESION N/A 11/01/2014   Procedure: LYSIS OF ADHESION;  Surgeon: Servando Salina, MD;  Location: [facility];  Service: Gynecology;  Laterality: N/A;  . 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 ROBOTIC ASSISTED SALPINGO OOPHERECTOMY Right 12/27/2016   Procedure: ROBOTIC ASSISTED RIGHT OOPHORECTOMY AND PERITONEAL BIOPSY;  Surgeon: Janie Morning, MD;  Location: [facility];  Service: Gynecology;  Laterality: Right;  . ROBOTIC ASSISTED TOTAL HYSTERECTOMY N/A 11/01/2014   Procedure: ROBOTIC ASSISTED TOTAL HYSTERECTOMY;  Surgeon: Servando Salina, MD;  Location: [facility];  Service: Gynecology;  Laterality: N/A;        Physical Exam: Blood pressure (!) 148/99, pulse 98, height _0  (1.6  m), weight 212 lb (96.2 kg), last menstrual period 02/02/2013.    Affect appropriate Healthy:  appears stated age 41: normal Neck supple with no adenopathy JVP normal no bruits no thyromegaly Lungs clear with no wheezing and good diaphragmatic motion Heart:  S1/S2 no murmur, no rub, gallop or click PMI normal Abdomen: benighn, BS positve, no tenderness, no AAA no bruit.  No HSM or HJR Distal pulses intact with no bruits No edema Neuro non-focal Skin warm and dry No muscular weakness   Labs:   Lab Results  Component Value Date   WBC 6.3 12/21/2016   HGB 14.1 12/21/2016   HCT 42.3 12/21/2016   MCV 87.9 12/21/2016   PLT 245 12/21/2016   No results for input(s): NA, K, CL, CO2, BUN, CREATININE, CALCIUM, PROT, BILITOT, ALKPHOS, ALT, AST, GLUCOSE in the last 168 hours.  Invalid input(s): LABALBU No results found for: CKTOTAL, CKMB, CKMBINDEX, TROPONINI  Lab Results  Component Value Date   CHOL 189 07/21/2015   CHOL 155 10/16/2011   Lab Results  Component Value Date   HDL 65 07/21/2015   HDL 60 10/16/2011   Lab Results  Component Value Date   LDLCALC 111 (H) 07/21/2015   LDLCALC 76 10/16/2011   Lab Results  Component Value Date   TRIG 66 07/21/2015   TRIG 96 10/16/2011   Lab Results  Component Value Date   CHOLHDL 2.9 07/21/2015   CHOLHDL 2.6 10/16/2011   No results found for: LDLDIRECT    Radiology: No results found.  EKG:  12/21/16  SR LAD [id number]  02/09/19 SR rate 98 LAD poor R wave progression    ASSESSMENT AND PLAN:   1. Tachycardia:  In setting of DM, likely some autonomic dysfunction Recent echo suggests EF mildly reduced now GLS markedly abnormal . I think she should have cardiac MRI to further investigate the myocardium. Given elevated HR despite beta blocker and decreased EF I think she should have a trial of Ivabradine. She has not had blood work or imaging to r/o Pheo  Will order plasma metanephrines   Signed: Jenkins Rouge 02/09/2019, 4:48 PM

## 2019-02-09 ENCOUNTER — Encounter: Payer: Self-pay | Admitting: Cardiovascular Disease

## 2019-02-09 ENCOUNTER — Other Ambulatory Visit: Payer: Self-pay

## 2019-02-09 ENCOUNTER — Ambulatory Visit: Payer: 59 | Admitting: Cardiovascular Disease

## 2019-02-09 VITALS — BP 148/99 | HR 98 | Ht 63.0 in | Wt 212.0 lb

## 2019-02-09 DIAGNOSIS — I499 Cardiac arrhythmia, unspecified: Secondary | ICD-10-CM | POA: Diagnosis not present

## 2019-02-09 DIAGNOSIS — R000 Tachycardia, unspecified: Secondary | ICD-10-CM | POA: Diagnosis not present

## 2019-02-09 MED ORDER — IVABRADINE HCL 5 MG PO TABS: 5.0000 mg | ORAL_TABLET | Freq: Two times a day (BID) | ORAL | 11 refills | Status: DC

## 2019-02-09 MED FILL — CORLANOR 5 MG TABLET: 5 | 30 days supply | Qty: 60 | Fill #0

## 2019-02-09 NOTE — Patient Instructions (Addendum)
Medication Instructions:  Your physician has recommended you make the following change in your medication:  1-START Ivabradine 5 mg by mouth twice a day with food.  If you need a refill on your cardiac medications before your next appointment, please call your pharmacy.   Lab work: Your physician recommends that you have lab work done at [facility].  If you have labs (blood work) drawn today and your tests are completely normal, you will receive your results only by: Marland Kitchen MyChart Message (if you have MyChart) OR . A paper copy in the mail If you have any lab test that is abnormal or we need to change your treatment, we will call you to review the results.  Testing/Procedures: Your physician has requested that you have a cardiac MRI. Cardiac MRI uses a computer to create images of your heart as its beating, producing both still and moving pictures of your heart and major blood vessels. For further information please visit [email or web address] Please follow the instruction sheet given to you today for more information.  Follow-Up: At [facility], you and your health needs are our priority.  As part of our continuing mission to provide you with exceptional heart care, we have created designated Provider Care Teams.  These Care Teams include your primary Cardiologist (physician) and Advanced Practice Providers (APPs -  Physician Assistants and Nurse Practitioners) who all work together to provide you with the care you need, when you need it. You will need a follow up appointment in 6 months.  Please call our office 2 months in advance to schedule this appointment.  You may see Dr. Johnsie Cancel or one of the following Advanced Practice Providers on your designated Care Team:   Truitt Merle, NP Cecilie Kicks, NP . Kathyrn Drown, NP   Ivabradine tablets What is this medicine? IVABRADINE (eye VAB ra deen) is used for heart failure. This medicine may be used for other purposes; ask  your health care provider or pharmacist if you have questions. COMMON BRAND NAME(S): Corlanor What should I tell my health care provider before I take this medicine? They need to know if you have any of these conditions:  certain heart conditions like sick sinus syndrome, sinoatrial block, or third-degree atrioventricular block  heart failure that has recently worsened  liver disease  low blood pressure  low resting heart rate  pacemaker  an unusual or allergic reaction to ivabradine, other medicines, foods, dyes, or preservatives  pregnant or trying to get pregnant  breast-feeding How should I use this medicine? Take this medicine by mouth with a glass of water. Follow the directions on the prescription label. Take this medicine with food. Avoid grapefruit juice. Take your medicine at regular intervals. Do not take it more often than directed. Do not stop taking except on your doctor's advice. Talk to your pediatrician regarding the use of this medicine in children. While this drug may be prescribed for children as young as 6 months for selected conditions, precautions do apply. Overdosage: If you think you have taken too much of this medicine contact a poison control center or emergency room at once. NOTE: This medicine is only for you. Do not share this medicine with others. What if I miss a dose? If you miss a dose, skip it. Take your next dose at the normal time. Do not take extra or 2 doses at the same time to make up for the missed dose. What may interact with this medicine? Do not take this  medicine with any of the following medications:  certain medicines for fungal infections like ketoconazole, itraconazole, or posaconazole  certain antibiotics like clarithromycin and telithromycin  certain antivirals for HIV or hepatitis  ceritinib  conivaptan  idelalisib  nefazodone  ribociclib This medicine may also interact with the following medications:  certain  medicines for blood pressure, heart disease, irregular heartbeat  certain medicines for seizures like phenobarbital and phenytoin  rifampicin  St. John's wort This list may not describe all possible interactions. Give your health care provider a list of all the medicines, herbs, non-prescription drugs, or dietary supplements you use. Also tell them if you smoke, drink alcohol, or use illegal drugs. Some items may interact with your medicine. What should I watch for while using this medicine? Visit your healthcare professional for regular checks on your progress. Tell your healthcare professional if your symptoms do not start to get better or if they get worse. You may experience changes in vision. Use caution if you are driving or using machinery when sudden changes in light intensity may occur, especially while driving at night. This effect may decrease after using this medicine for a long time. Do not become pregnant while taking this medicine. Women should inform their healthcare professional if they wish to become pregnant or think they might be pregnant. There is a potential for serious side effects and harm to an unborn child. Talk to your health care professional for more information. What side effects may I notice from receiving this medicine? Side effects that you should report to your doctor or health care professional as soon as possible:  allergic reactions like skin rash, itching or hives, swelling of the face, lips, or tongue  high blood pressure  signs and symptoms of a dangerous change in heartbeat or heart rhythm like chest pain; dizziness; fast or irregular heartbeat; palpitations; feeling faint or lightheaded; breathing problems  unusually slow heartbeat Side effects that usually do not require medical attention (report to your doctor or health care professional if they continue or are bothersome):  changes in vision This list may not describe all possible side effects.  Call your doctor for medical advice about side effects. You may report side effects to FDA at 1-800-FDA-1088. Where should I keep my medicine? Keep out of the reach of children. Store at room temperature between 20 and 25 degrees C (68 and 77 degrees F). Throw away any unused medicine after the expiration date. NOTE: This sheet is a summary. It may not cover all possible information. If you have questions about this medicine, talk to your doctor, pharmacist, or health care provider.  2020 Elsevier/Gold Standard (2017-12-23 14:04:38)

## 2019-02-11 ENCOUNTER — Ambulatory Visit: Payer: 59 | Admitting: Family Medicine

## 2019-02-13 ENCOUNTER — Telehealth: Payer: Self-pay | Admitting: Cardiovascular Disease

## 2019-02-13 DIAGNOSIS — I499 Cardiac arrhythmia, unspecified: Secondary | ICD-10-CM

## 2019-02-13 DIAGNOSIS — R000 Tachycardia, unspecified: Secondary | ICD-10-CM

## 2019-02-13 NOTE — Telephone Encounter (Signed)
Patient wanted to know if the office could fax over lab orders to her work to get her labs done. 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 Please fax the orders to [phone]

## 2019-02-13 NOTE — Telephone Encounter (Signed)
Left message for patient to call back  

## 2019-02-13 NOTE — Telephone Encounter (Signed)
Informed patient that an order would be sent over for Plasma Metanephrines to fax number provided. Patient request it to be sent next week, that she is not back in the office until then.

## 2019-02-18 ENCOUNTER — Other Ambulatory Visit (INDEPENDENT_AMBULATORY_CARE_PROVIDER_SITE_OTHER): Payer: 59

## 2019-02-18 DIAGNOSIS — R000 Tachycardia, unspecified: Secondary | ICD-10-CM

## 2019-02-18 DIAGNOSIS — I499 Cardiac arrhythmia, unspecified: Secondary | ICD-10-CM

## 2019-02-20 ENCOUNTER — Other Ambulatory Visit: Payer: 59

## 2019-02-25 ENCOUNTER — Telehealth: Payer: Self-pay | Admitting: *Deleted

## 2019-02-25 ENCOUNTER — Encounter: Payer: Self-pay | Admitting: *Deleted

## 2019-02-25 LAB — METANEPHRINES, PLASMA
Metanephrine, Free: 52.3 pg/mL (ref 0.0–88.0)
Normetanephrine, Free: 105 pg/mL (ref 0.0–125.8)

## 2019-02-25 NOTE — Telephone Encounter (Signed)
Left message regarding appointment for Cardiac MRI scheduled on [id number] at 11:00 am at [facility].  Arrival time is 10:15 am at the 1st floor admissions office.  Will mail information to patient and it is also in My Chart.

## 2019-03-05 ENCOUNTER — Telehealth: Payer: Self-pay | Admitting: Cardiovascular Disease

## 2019-03-05 NOTE — Telephone Encounter (Signed)
error 

## 2019-03-06 ENCOUNTER — Telehealth (HOSPITAL_COMMUNITY): Payer: Self-pay | Admitting: Emergency Medicine

## 2019-03-06 NOTE — Telephone Encounter (Signed)
Left message on voicemail with name and callback number Raine Elsass RN Navigator Cardiac Imaging [facility] [phone] Office [phone] Cell  

## 2019-03-09 ENCOUNTER — Ambulatory Visit (HOSPITAL_COMMUNITY): Admission: RE | Admit: 2019-03-09 | Payer: 59 | Source: Ambulatory Visit

## 2019-03-09 ENCOUNTER — Other Ambulatory Visit: Payer: Self-pay | Admitting: Internal Medicine

## 2019-03-09 DIAGNOSIS — Z780 Asymptomatic menopausal state: Secondary | ICD-10-CM

## 2019-03-09 MED ORDER — ESTRADIOL 0.1 MG/24HR TD PTWK: 0.1000 mg | MEDICATED_PATCH | TRANSDERMAL | 3 refills | Status: DC

## 2019-03-09 MED FILL — ESTRADIOL 0.1 MG/DAY PATCH: 0.1 | 84 days supply | Qty: 12 | Fill #0

## 2019-03-10 MED FILL — CORLANOR 5 MG TABLET: 5 | 30 days supply | Qty: 60 | Fill #0

## 2019-03-11 ENCOUNTER — Encounter: Payer: Self-pay | Admitting: *Deleted

## 2019-03-11 ENCOUNTER — Telehealth: Payer: Self-pay | Admitting: *Deleted

## 2019-03-11 ENCOUNTER — Other Ambulatory Visit: Payer: Self-pay | Admitting: Cardiovascular Disease

## 2019-03-11 DIAGNOSIS — I499 Cardiac arrhythmia, unspecified: Secondary | ICD-10-CM

## 2019-03-11 MED FILL — SAXENDA 18 MG/3 ML PEN: 18 | 30 days supply | Qty: 15 | Fill #1

## 2019-03-11 NOTE — Telephone Encounter (Signed)
Left message with information regarding appointment for Cardiac MRI scheduled for 04/03/19 at 9:00 am.  Informed patient MRI is not done on Friday afternoons.  Will also mail information to patient

## 2019-03-20 ENCOUNTER — Ambulatory Visit: Payer: 59 | Admitting: Pediatrics

## 2019-04-02 ENCOUNTER — Ambulatory Visit (INDEPENDENT_AMBULATORY_CARE_PROVIDER_SITE_OTHER): Payer: 59 | Admitting: Sports Medicine

## 2019-04-02 ENCOUNTER — Other Ambulatory Visit: Payer: Self-pay

## 2019-04-02 ENCOUNTER — Telehealth (HOSPITAL_COMMUNITY): Payer: Self-pay | Admitting: Emergency Medicine

## 2019-04-02 ENCOUNTER — Ambulatory Visit
Admission: RE | Admit: 2019-04-02 | Discharge: 2019-04-02 | Disposition: A | Discharge status: Home / Self Care | Payer: 59 | Source: Ambulatory Visit | Attending: Sports Medicine | Admitting: Sports Medicine

## 2019-04-02 VITALS — BP 124/90 | Ht 64.0 in | Wt 203.0 lb

## 2019-04-02 DIAGNOSIS — M25552 Pain in left hip: Secondary | ICD-10-CM | POA: Diagnosis not present

## 2019-04-02 IMAGING — CR DG HIP (WITH OR WITHOUT PELVIS) 2-3V*L*
2 series · 2 of 2 positions shown · non-contrast
Comparison: None.

CLINICAL DATA: 42-year-old female with left hip pain. No known
injury.

EXAM:
DG HIP (WITH OR WITHOUT PELVIS) 2-3V LEFT

[t hip ap left]
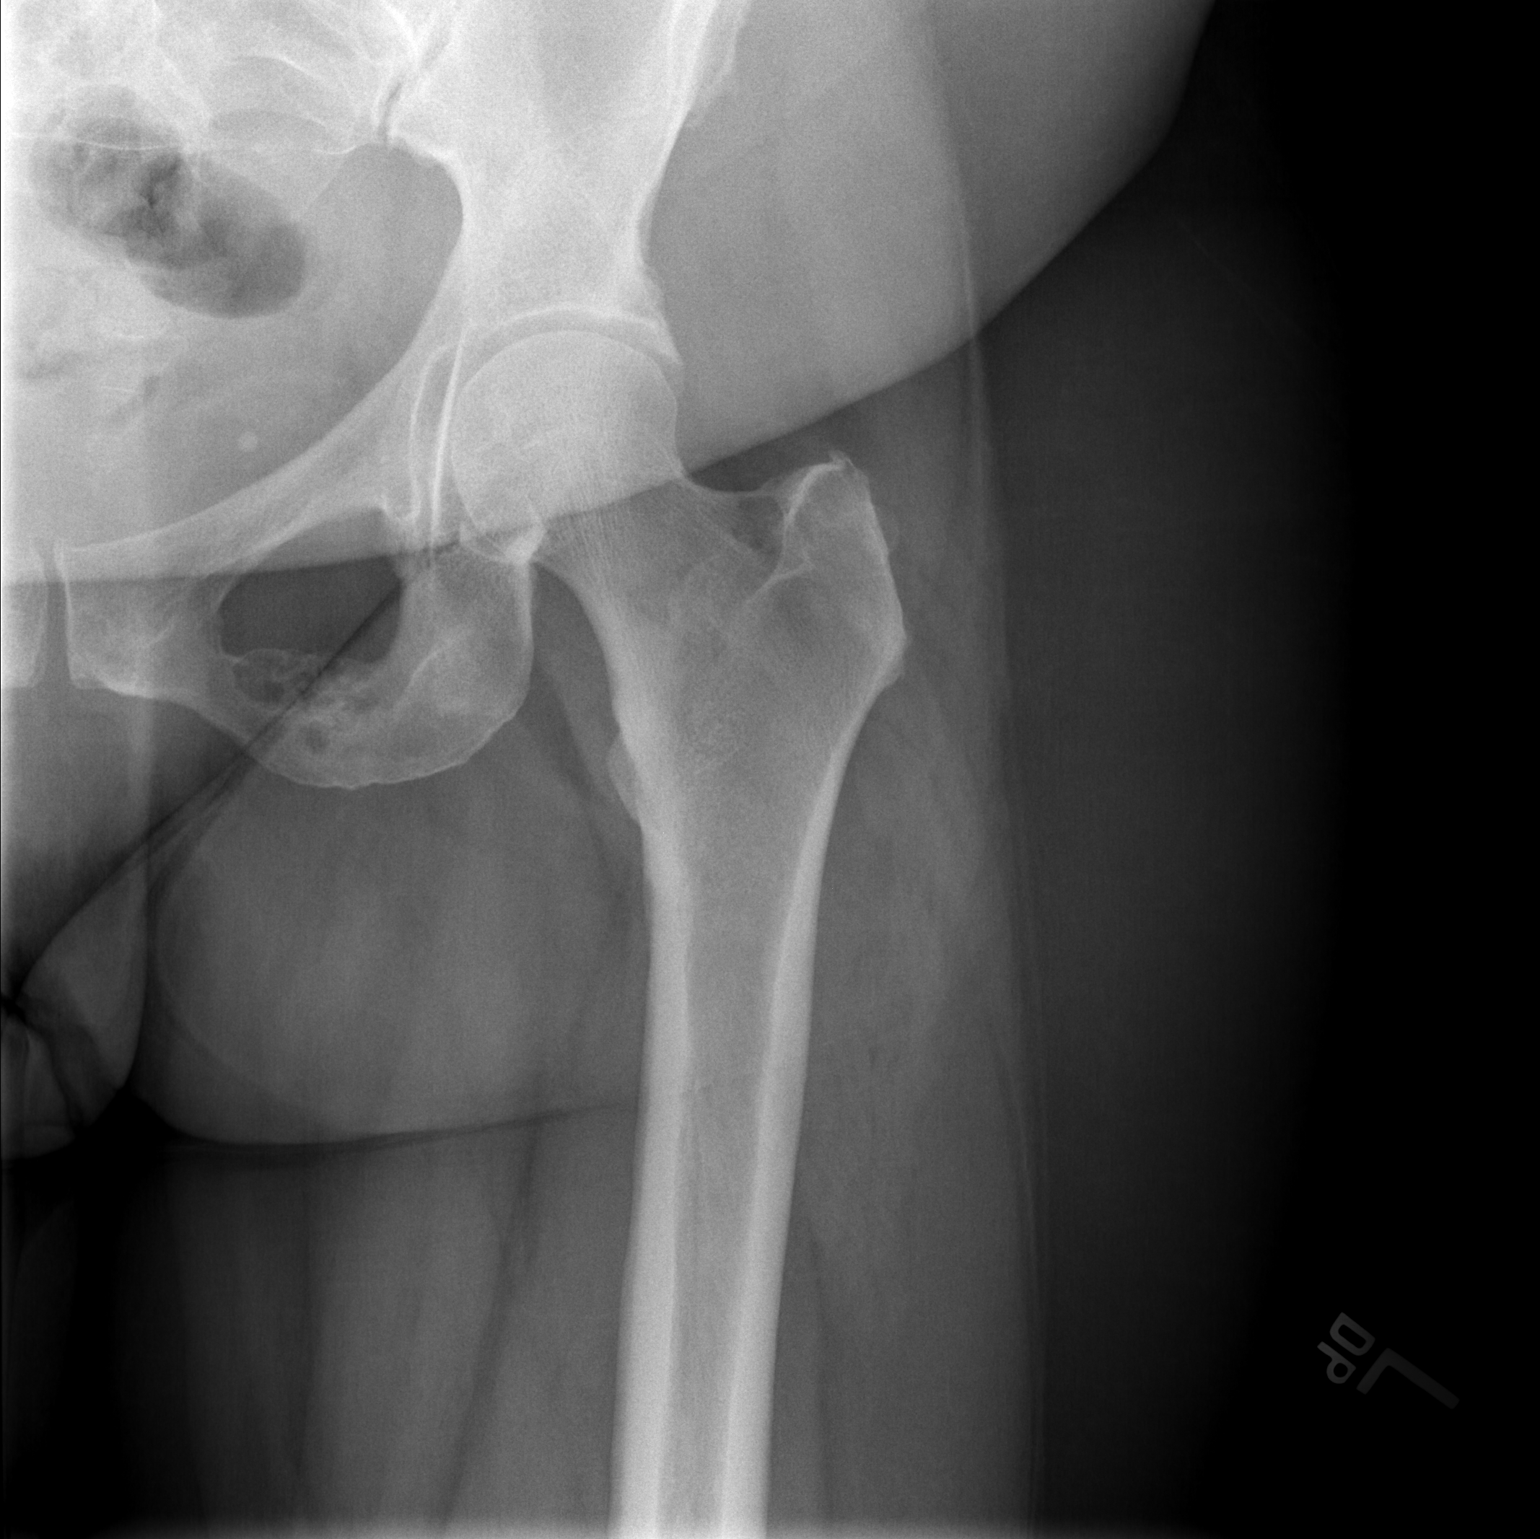

[t hip frog leg left]
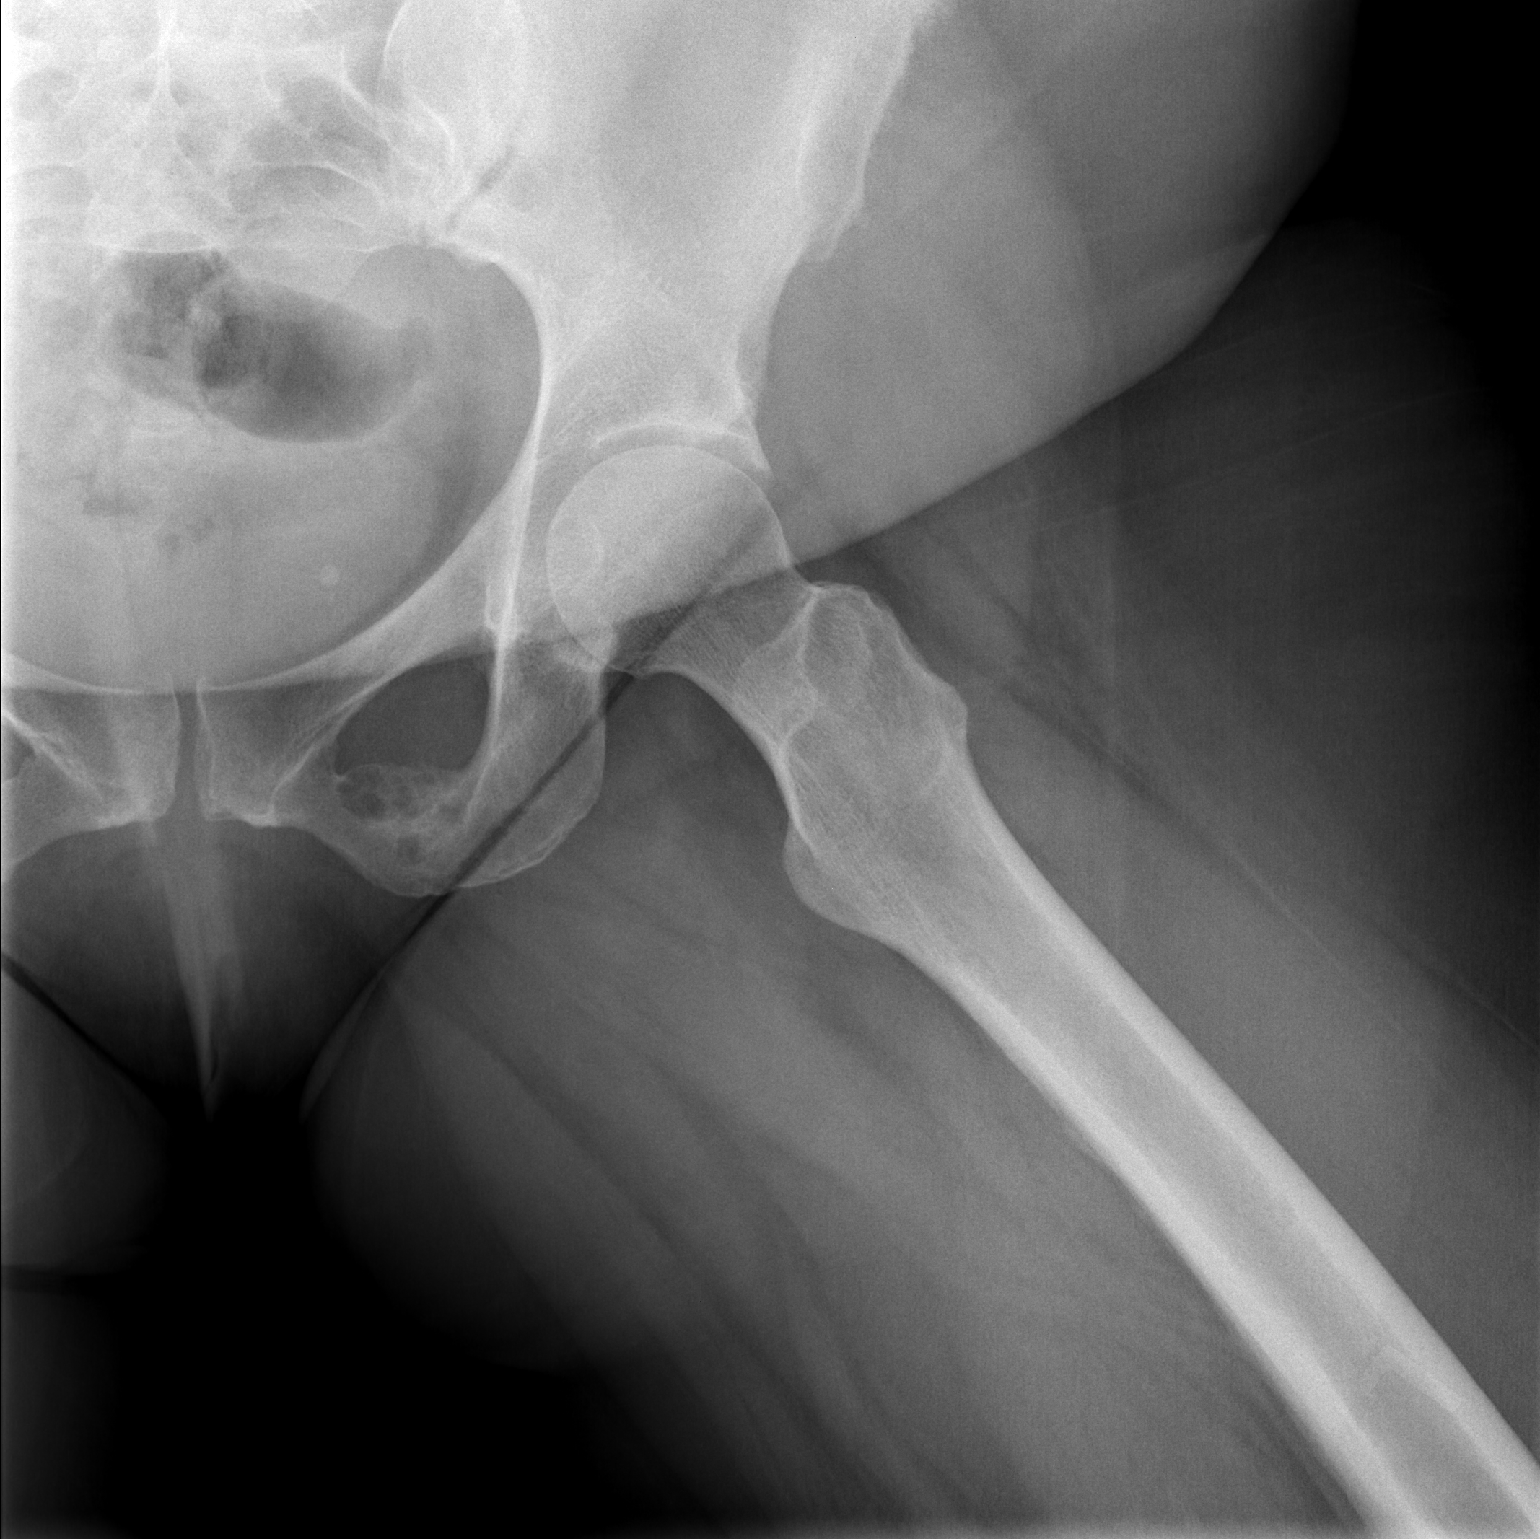

[2 of 2 positions shown; findings below may reference images not displayed]

FINDINGS: There is no acute fracture or dislocation. The bones are well
mineralized. Somewhat expansile and bubbly lucencies of the left
inferior pubic ramus with cortical thinning. This lucent lesion
measures approximately 2.8 x 1.1 cm. This may represent a giant cell
tumor. Other etiologies including malignancy or metastasis are not
excluded. Further evaluation with MRI without and with contrast
recommended. The soft tissues are unremarkable.
IMPRESSION: 1. No acute fracture or dislocation.
2. Lucent lesion of the left inferior pubic ramus with cortical
thinning. Further evaluation with MRI without and with contrast
recommended. No associated pathology fracture.

## 2019-04-02 NOTE — Progress Notes (Signed)
  Joy COKER - 43 y.o. female MRN [id number]  Date of birth: Jul 30, 1975  SUBJECTIVE:   CC: Left hip pain  HPI: Ms. Joy Contreras is a 43 year old female presenting for evaluation of left hip pain.  Left hip pain: Started approximately 2 months ago, describes this as a deep, sharp intermittent anterior pain hip/groin pain.  Cannot identify any eliciting trauma or injury prior to onset of pain, however states her daughter was stretching out that hip 2 days before and wonders if this contributed.  Noticing increased nonpainful popping, but otherwise denies any falls or giving out sensation, numbness, weakness.  Denies any tenderness with palpation around her hip, feels like she cannot exactly pinpoint the area.  She has tried some ibuprofen intermittently that helps some and heating pads without help.  She is not been too active other than at work.  No associated low back pain or knee pain.    ROS: No unexpected weight loss, fever, chills, swelling, instability, muscle pain, numbness/tingling, redness, otherwise see HPI   PMHx, surgical, family, and social history reviewed.  Medications reviewed.- Updated and reviewed.  PHYSICAL EXAM:  VS: BP:124/90  HR: bpm  TEMP: ( )  RESP:   HT:5\' 4"  (162.6 cm)   WT:203 lb (92.1 kg)  BMI:34.83 PHYSICAL EXAM: Gen: NAD, alert, cooperative with exam, well-appearing Resp: non-labored Skin: no rashes, normal turgor  Neuro: no gross deficits.  Psych:  alert and oriented  Left hip:  - Inspection: No gross deformity, no swelling, erythema, or ecchymosis - Palpation: No TTP, specifically none over greater trochanter or IT band - ROM: Limited active ROM with hip flexion, external rotation.  Normal hip extension, internal rotation, knee flexion/extension.  - Strength: Normal strength. - Neuro/vasc: NV intact distally - Special Tests: Positive Trendelenburg.  Groin pain only with FABER. negative FADIR.  Negative Ober's.   Right hip:  - Inspection: No  gross deformity, no swelling, erythema, or ecchymosis - Palpation: No TTP - ROM: Normal range of motion on Flexion, extension, abduction, internal and external rotation - Strength: Normal strength. - Neuro/vasc: NV intact distally - Special Tests: Negative FABER and FADIR.   Negative Trendelenberg.  Negative Ober's.   ASSESSMENT & PLAN:   Left hip pain "Deep" anterior hip and groin pain with reduced active ROM and positive Trendelenburg.  Differential including arthritis, labral/ligamentous pathology, iliopsoas strain, pelvic muscular strain. Will obtain hip x-rays to rule out any arthritic changes or bony abnormalities, but suspect this will likely be normal.  In the meantime, provided her with a daily home pelvic/hip stabilizing/strengthening exercise program.  Recommend modifying activity as tolerated with pain and may use Tylenol/ibuprofen as needed.  Will have her follow-up in approximately 6 weeks to assess progress or sooner if needed.  Can consider advanced imaging for further evaluation if not better.   Follow-up in 6 weeks or sooner if needed.  Patriciaann Clan, DO  Family Medicine PGY-2   Patient seen and evaluated with the resident.  I personally reviewed x-rays of her left hip.  There is a lucent lesion in the inferior pubic ramus with cortical thickening which is concerning for possible giant cell tumor or other malignancy.  We will get an MRI with and without contrast to evaluate further.  I will follow up with the patient with those results once available.

## 2019-04-02 NOTE — Telephone Encounter (Signed)
Left message on voicemail with name and callback number Tryson Lumley RN Navigator Cardiac Imaging [facility] [phone] Office [phone] Cell  

## 2019-04-02 NOTE — Patient Instructions (Signed)
It was wonderful meeting you today.  We have ordered x-rays of your hip just to rule out any arthritis or bony abnormality, for which you can walk over to [facility] imaging at your convenience to have completed and we will get the results of these.  In the meantime, we would like you to start doing these pelvis stabilizing and hip strengthening exercises on a daily basis.  You may continue to walk and do activity as tolerated, if you are having pain then you need to modify this.  We would like to see you back in 6 weeks to assess how you are doing or sooner if needed.

## 2019-04-02 NOTE — Assessment & Plan Note (Signed)
"  Deep" anterior hip and groin pain with reduced active ROM and positive Trendelenburg.  Differential including arthritis, labral/ligamentous pathology, iliopsoas strain, pelvic muscular strain. Will obtain hip x-rays to rule out any arthritic changes or bony abnormalities, but suspect this will likely be normal.  In the meantime, provided her with a daily home pelvic/hip stabilizing/strengthening exercise program.  Recommend modifying activity as tolerated with pain and may use Tylenol/ibuprofen as needed.  Will have her follow-up in approximately 6 weeks to assess progress or sooner if needed.  Can consider advanced imaging for further evaluation if not better.

## 2019-04-03 ENCOUNTER — Other Ambulatory Visit: Payer: Self-pay

## 2019-04-03 ENCOUNTER — Encounter: Payer: Self-pay | Admitting: Sports Medicine

## 2019-04-03 ENCOUNTER — Ambulatory Visit (HOSPITAL_COMMUNITY)
Admission: RE | Admit: 2019-04-03 | Discharge: 2019-04-03 | Disposition: A | Discharge status: Home / Self Care | Payer: 59 | Source: Ambulatory Visit | Attending: Cardiovascular Disease | Admitting: Cardiovascular Disease

## 2019-04-03 DIAGNOSIS — I499 Cardiac arrhythmia, unspecified: Secondary | ICD-10-CM | POA: Insufficient documentation

## 2019-04-03 DIAGNOSIS — I429 Cardiomyopathy, unspecified: Secondary | ICD-10-CM | POA: Diagnosis not present

## 2019-04-03 DIAGNOSIS — M25552 Pain in left hip: Secondary | ICD-10-CM

## 2019-04-03 IMAGING — MR MR CARD MORPHOLOGY WO/W CM
37 of 39 series · 37 of 40 positions shown · IV contrast (Gadavist)
Comparison: none

CLINICAL DATA: Cardiomyopathy

EXAM:
CARDIAC MRI
TECHNIQUE: The patient was scanned on a 1.5 Tesla GE magnet. A dedicated
cardiac coil was used. Functional imaging was done using Fiesta
sequences. [DATE], and 4 chamber views were done to assess for RWMA's.
Modified MURALIDHAR NEERAJ rule using a short axis stack was used to
calculate an ejection fraction on a dedicated work station using
Circle software. The patient received 12 cc of Multihance. After 10
minutes inversion recovery sequences were used to assess for
infiltration and scar tissue.

[Series 6: bSSFP · oblique · 8.0mm · 1.56mm/px · 1 of 25 slices shown (1 of 17)]
[im 1/25]
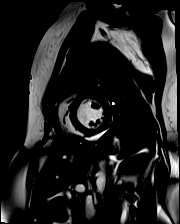

[Series 6: bSSFP · oblique · 8.0mm · 1.56mm/px · 1 of 25 slices shown (2 of 17)]
[im 1/25]
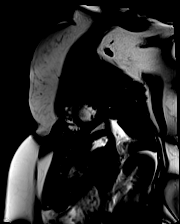

[Series 6: bSSFP · oblique · 8.0mm · 1.56mm/px · 1 of 25 slices shown (3 of 17)]
[im 1/25]
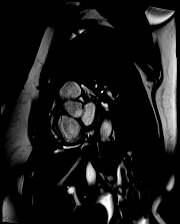

[Series 6: bSSFP · oblique · 8.0mm · 1.56mm/px · 1 of 25 slices shown (4 of 17)]
[im 1/25]
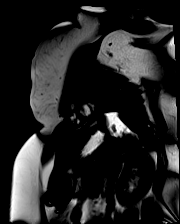

[Series 6: bSSFP · oblique · 8.0mm · 1.56mm/px · 1 of 25 slices shown (5 of 17)]
[im 1/25]
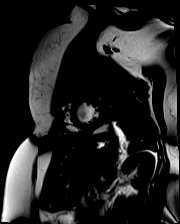

[Series 6: bSSFP · oblique · 8.0mm · 1.56mm/px · 1 of 25 slices shown (6 of 17)]
[im 1/25]
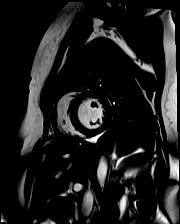

[Series 6: bSSFP · oblique · 8.0mm · 1.56mm/px · 1 of 25 slices shown (7 of 17)]
[im 1/25]
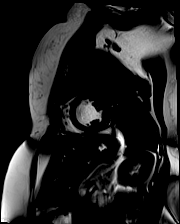

[Series 6: bSSFP · oblique · 8.0mm · 1.56mm/px · 1 of 25 slices shown (8 of 17)]
[im 1/25]
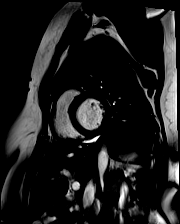

[Series 6: bSSFP · oblique · 8.0mm · 1.56mm/px · 1 of 25 slices shown (9 of 17)]
[im 1/25]
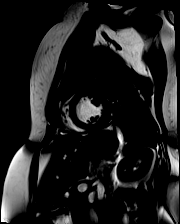

[Series 6: bSSFP · oblique · 8.0mm · 1.56mm/px · 1 of 25 slices shown (10 of 17)]
[im 1/25]
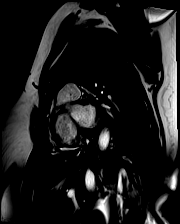

[Series 6: bSSFP · oblique · 8.0mm · 1.56mm/px · 1 of 25 slices shown (11 of 17)]
[im 1/25]
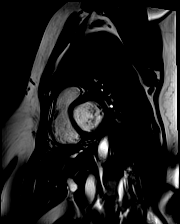

[Series 6: bSSFP · oblique · 8.0mm · 1.56mm/px · 1 of 25 slices shown (12 of 17)]
[im 1/25]
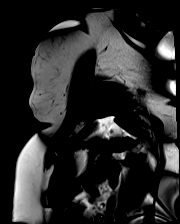

[Series 6: bSSFP · oblique · 8.0mm · 1.56mm/px · 1 of 25 slices shown (13 of 17)]
[im 1/25]
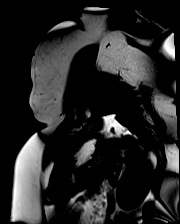

[Series 6: bSSFP · oblique · 8.0mm · 1.56mm/px · 1 of 25 slices shown (14 of 17)]
[im 1/25]
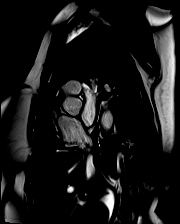

[Series 7: hla cine · axial · 8.0mm · 1.56mm/px · 1 of 25 slices shown (1 of 8)]
[im 1/25]
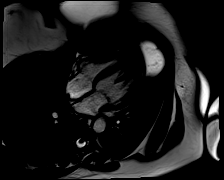

[Series 7: hla cine · axial · 8.0mm · 1.56mm/px · 1 of 25 slices shown (2 of 8)]
[im 1/25]
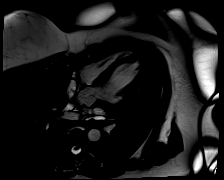

[Series 7: hla cine · axial · 8.0mm · 1.56mm/px · 1 of 25 slices shown (3 of 8)]
[im 1/25]
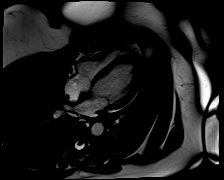

[Series 7: hla cine · axial · 8.0mm · 1.56mm/px · 1 of 25 slices shown (4 of 8)]
[im 1/25]
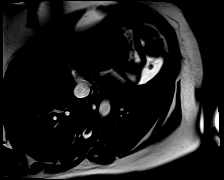

[Series 7: hla cine · axial · 8.0mm · 1.56mm/px · 1 of 25 slices shown (5 of 8)]
[im 1/25]
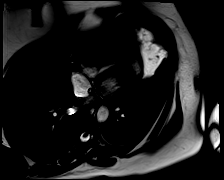

[Series 7: hla cine · axial · 8.0mm · 1.56mm/px · 1 of 25 slices shown (6 of 8)]
[im 1/25]
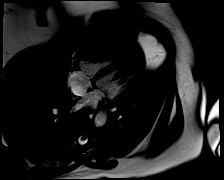

[Series 7: hla cine · axial · 8.0mm · 1.56mm/px · 1 of 25 slices shown (7 of 8)]
[im 1/25]
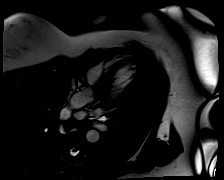

[Series 7: hla cine · axial · 8.0mm · 1.56mm/px · 1 of 25 slices shown (8 of 8)]
[im 1/25]
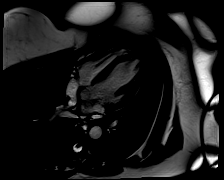

[Series 8: t2_stir_db_sax · oblique · 8.0mm · 1.63mm/px · 1 of 12 slices shown]
[im 1/12]
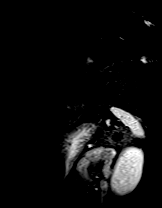

[Series 9: t2_stir_db_radial ((date)ch) · axial · 6.0mm · 1.73mm/px · 1 of 2 slices shown]
[im 1/2]
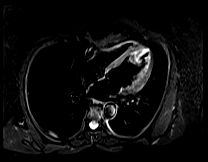

[Series 10: bSSFP · oblique · 6.5mm · 1.41mm/px · 1 of 25 slices shown (15 of 17)]
[im 1/25]
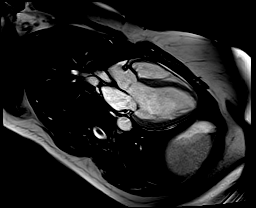

[Series 11: bSSFP · axial · 6.5mm · 1.41mm/px · 1 of 25 slices shown (16 of 17)]
[im 1/25]
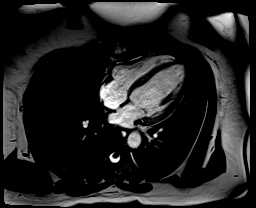

[Series 12: bSSFP · coronal · 6.5mm · 1.41mm/px · 1 of 25 slices shown (17 of 17)]
[im 1/25]
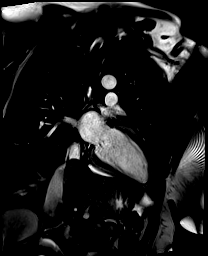

[Series 14: lge_single shot sa · oblique · 8.0mm · 1.98mm/px · 1 of 14 slices shown (1 of 2)]
[im 1/14]
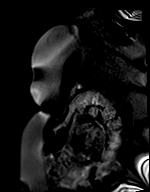

[Series 15: lge_single shot sa · oblique · 8.0mm · 1.98mm/px · 1 of 14 slices shown (2 of 2)]
[im 1/14]
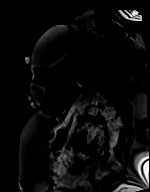

[Series 16: lge_single shot radial_mag · axial · 6.0mm · 1.98mm/px · 1 of 1 slices shown (1 of 2)]
[im 1/1]
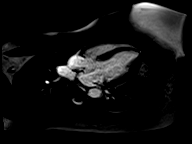

[Series 17: lge_single shot radial_psir · axial · 6.0mm · 1.98mm/px · 1 of 1 slices shown (1 of 2)]
[im 1/1]
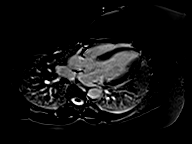

[Series 18: lge_single shot radial_mag · axial · 6.0mm · 1.98mm/px · 1 of 1 slices shown (2 of 2)]
[im 1/1]
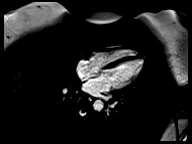

[Series 19: lge_single shot radial_psir · axial · 6.0mm · 1.98mm/px · 1 of 1 slices shown (2 of 2)]
[im 1/1]
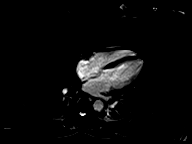

[Series 23: lge short axis_mag · oblique · 8.0mm · 1.52mm/px · 1 of 12 slices shown]
[im 1/12]
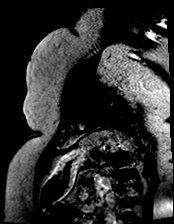

[Series 24: lge short axis_psir · oblique · 8.0mm · 1.52mm/px · 1 of 12 slices shown]
[im 1/12]
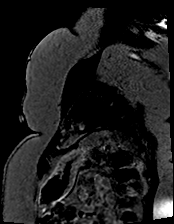

[Series 27: lge radial ((date)ch)_mag · axial · 6.0mm · 1.61mm/px · 1 of 1 slices shown]
[im 1/1]
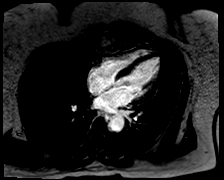

[Series 28: lge radial ((date)ch)_psir · axial · 6.0mm · 1.61mm/px · 1 of 1 slices shown]
[im 1/1]
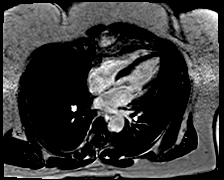

[37 of 40 positions shown; findings below may reference images not displayed]

FINDINGS: Normal chamber sizes. Normal aortic root 2.8 cm. No ASD/PFO. Mild
appearing MR. Normal TV and AV tri-leaflet. No effusion. Normal RV
size and function. Septal thickness 10 mm Low normal EF 53% (EDV 87
ESV 41 cc SV 46 cc) No hyper-enhancement on delayed inversion
recovery gadolinium images
IMPRESSION: 1. Normal chamber sizes

2.  Normal RV size and function

3. Low normal EF 53% with normal septal thickness 10 mm and no
RWMA;s

4.  No delayed gadolinium uptake

5.  Mild appearing MR

6.  No effusion

7.  Normal aortic root 2.8 cm

MURALIDHAR NEERAJ

## 2019-04-03 MED ORDER — GADOBUTROL 1 MMOL/ML IV SOLN
10.0000 mL | Freq: Once | INTRAVENOUS | Status: AC | PRN
  Administered 2019-04-03: 10 mL via INTRAVENOUS

## 2019-04-14 ENCOUNTER — Other Ambulatory Visit: Payer: Self-pay

## 2019-04-14 ENCOUNTER — Ambulatory Visit (HOSPITAL_COMMUNITY)
Admission: RE | Admit: 2019-04-14 | Discharge: 2019-04-14 | Disposition: A | Discharge status: Home / Self Care | Payer: 59 | Source: Ambulatory Visit | Attending: Sports Medicine | Admitting: Sports Medicine

## 2019-04-14 DIAGNOSIS — M25552 Pain in left hip: Secondary | ICD-10-CM | POA: Diagnosis not present

## 2019-04-14 IMAGING — MR MR PELVIS WO/W CM
8 of 12 series · 34 of 48 positions shown · IV contrast (gadavist)
Comparison: X-ray left hip [DATE]

CLINICAL DATA: Chronic hip pain, left pubic ramus lesion.

EXAM:
MRI PELVIS WITHOUT AND WITH CONTRAST
TECHNIQUE: Multiplanar multisequence MR imaging of the pelvis was performed
both before and after administration of intravenous contrast.
CONTRAST:  9mL GADAVIST GADOBUTROL 1 MMOL/ML IV SOLN

[Series 8: T1 · axial · left · 4.0mm · 0.74mm/px · z∈[-47,+183]mm · 6 of 46 slices shown (1 of 2)]
[im 1/46]
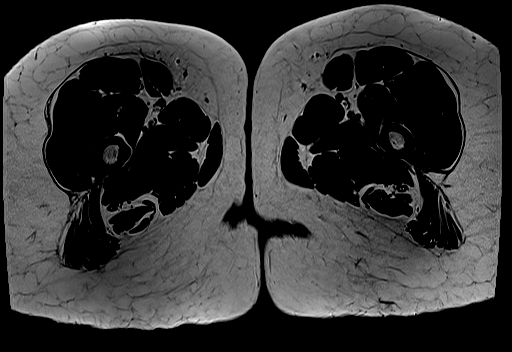
[im 10/46]
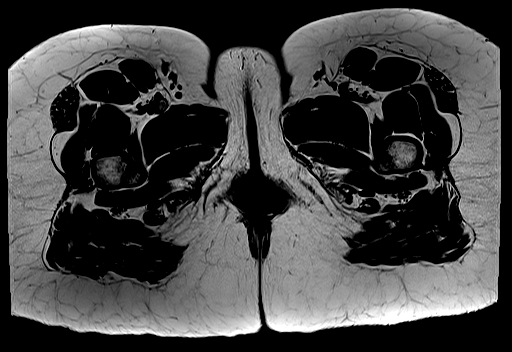
[im 19/46]
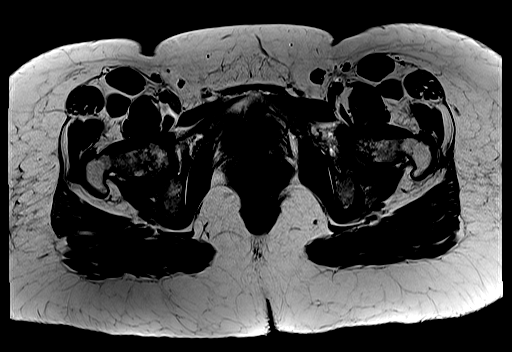
[im 28/46]
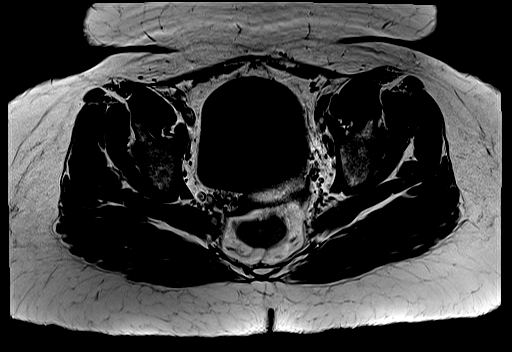
[im 37/46]
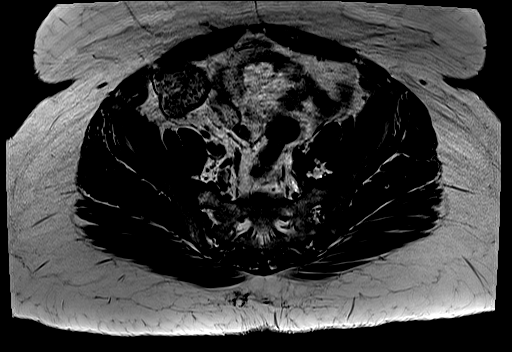
[im 46/46]
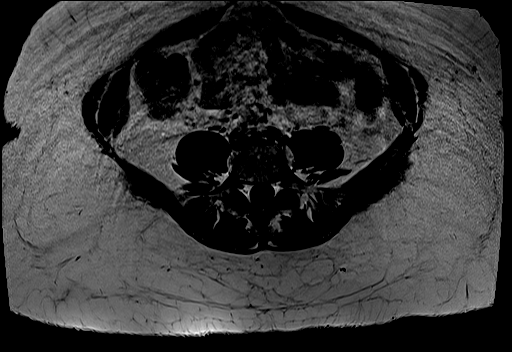

[Series 9: STIR · coronal · left · 4.0mm · 0.94mm/px · 4 of 32 slices shown]
[im 1/32]
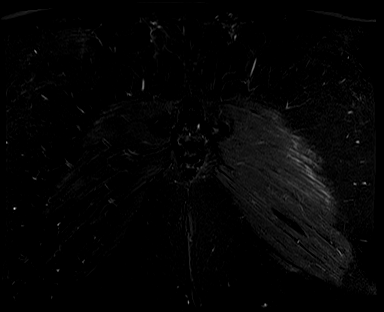
[im 11/32]
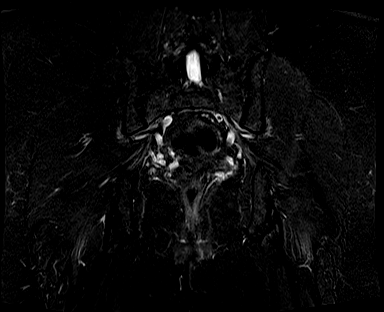
[im 21/32]
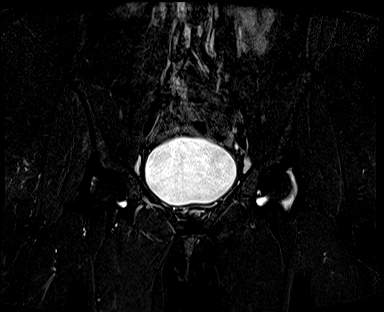
[im 32/32]
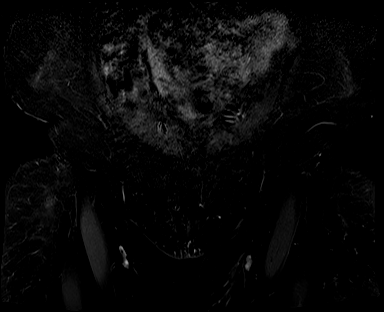

[Series 10: T1 · coronal · left · 4.0mm · 0.94mm/px · 4 of 32 slices shown (2 of 2)]
[im 1/32]
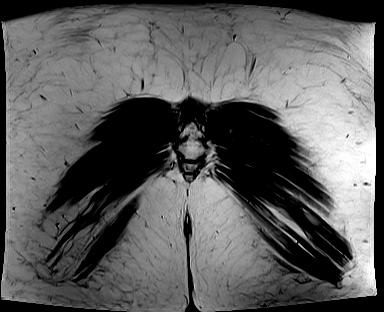
[im 11/32]
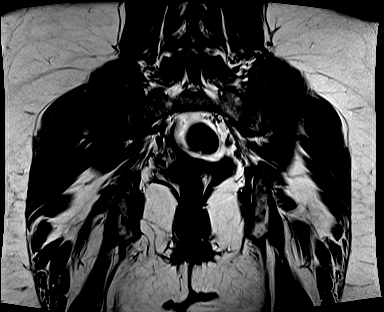
[im 21/32]
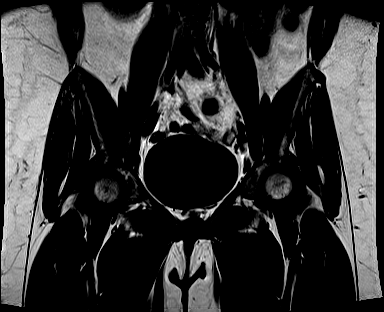
[im 32/32]
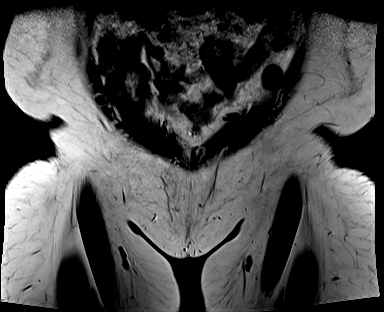

[Series 11: T2 fat-sat · axial · left · 4.0mm · 0.74mm/px · z∈[-62,+183]mm · 5 of 50 slices shown (1 of 2)]
[im 1/50]
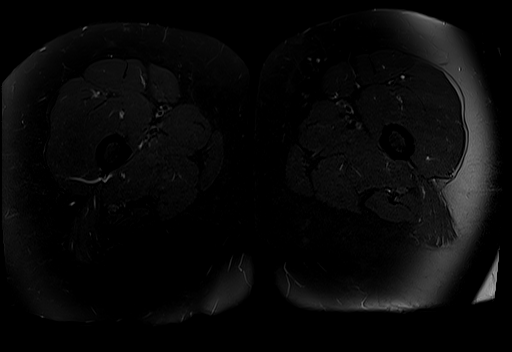
[im 13/50]
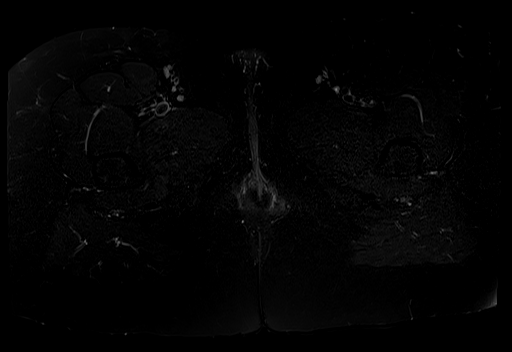
[im 25/50]
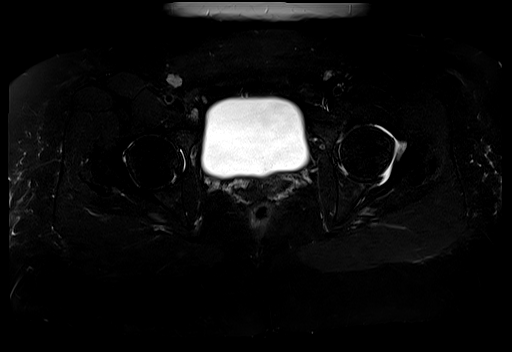
[im 37/50]
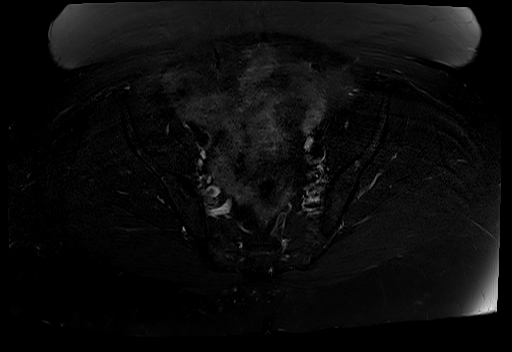
[im 50/50]
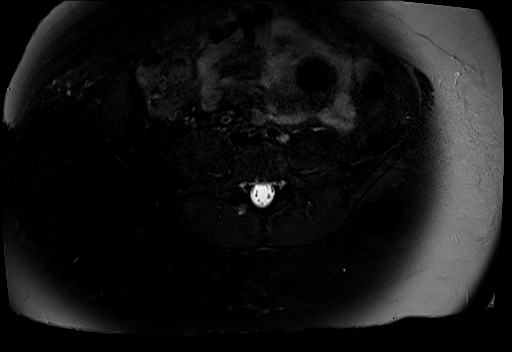

[Series 12: T2 fat-sat · sagittal · left · 4.0mm · 0.71mm/px · 6 of 60 slices shown (2 of 2)]
[im 1/60]
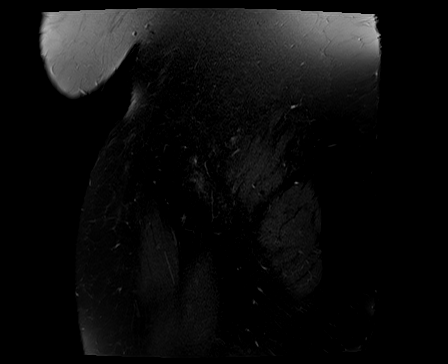
[im 12/60]
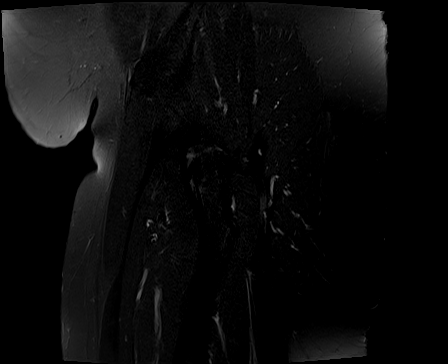
[im 24/60]
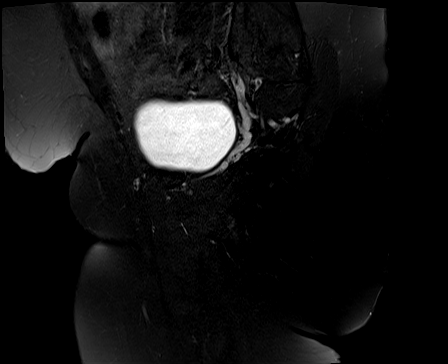
[im 36/60]
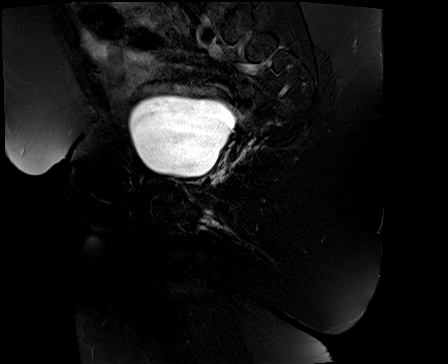
[im 48/60]
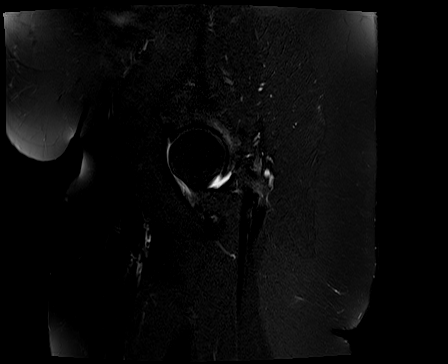
[im 60/60]
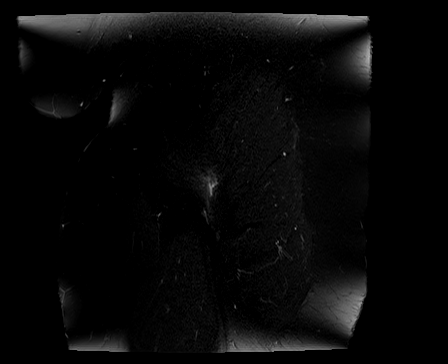

[Series 13: PD fat-sat · sagittal · left · 4.0mm · 0.62mm/px · 3 of 28 slices shown (1 of 2)]
[im 1/28]
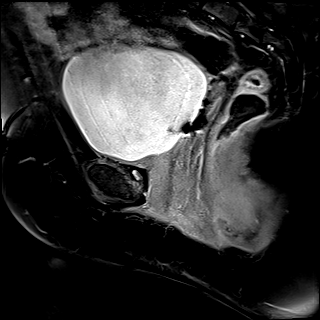
[im 14/28]
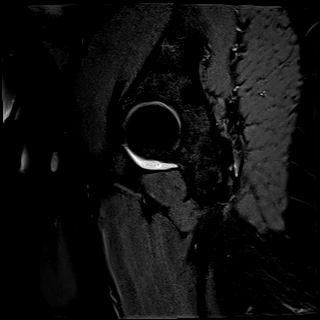
[im 28/28]
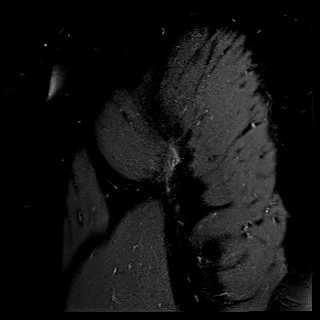

[Series 15: T1 fat-sat post-contrast · axial · non-contrast · left · 4.0mm · 0.99mm/px · z∈[-62,+183]mm · 5 of 50 slices shown]
[im 1/50]
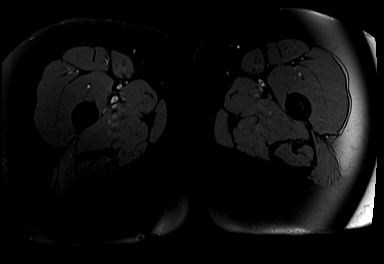
[im 13/50]
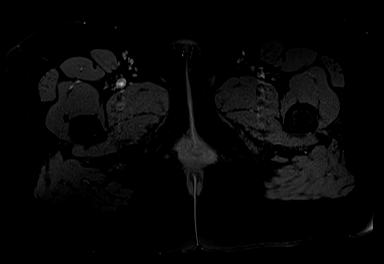
[im 25/50]
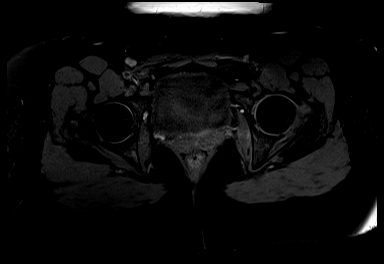
[im 37/50]
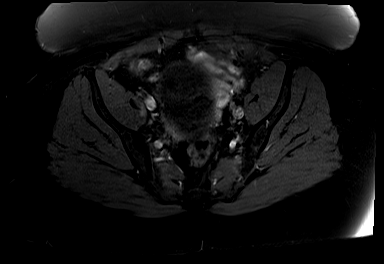
[im 50/50]
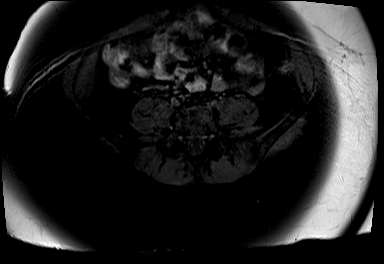

[Series 16: PD fat-sat · coronal · left · 3.0mm · 0.78mm/px · 1 of 20 slices shown (2 of 2)]
[im 1/20]
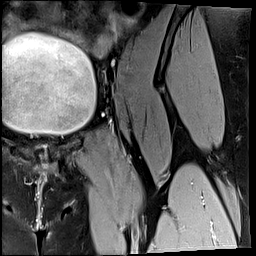

[34 of 48 positions shown; findings below may reference images not displayed]

FINDINGS: Bones: No hip fracture, dislocation or avascular necrosis. No
periosteal reaction or bone destruction.

1.2 x 2.7 x 1.4 cm T1 intermediate signal and T2 hyperintense
expansile bone lesion in the left inferior pubic ramus with a
gyriform morphology and enhancement on postcontrast imaging most
concerning for a vascular malformation versus less likely a
chondroid lesion given the proximity to the synchondrosis versus
less likely metastatic disease/myeloma.

Normal sacrum and sacroiliac joints. No SI joint widening or erosive
changes.

Articular cartilage and labrum

Articular cartilage:  No chondral defect.

Labrum: Grossly intact, but evaluation is limited by lack of
intraarticular fluid.

Joint or bursal effusion

Joint effusion:  No hip joint effusion.  No SI joint effusion.

Bursae:  No bursa formation.

Muscles and tendons

Flexors: Normal.

Extensors: Normal.

Abductors: Normal.

Adductors: Normal.

Gluteals: Normal.

Hamstrings: Mild tendinosis of the left hamstring origin.

Other findings

Miscellaneous: No pelvic free fluid. No fluid collection or
hematoma. No inguinal lymphadenopathy. No inguinal hernia.
IMPRESSION: 1. 1.2 x 2.7 x 1.4 cm expansile bone lesion in the left inferior
pubic ramus with a gyriform morphology and enhancement on
postcontrast imaging most concerning for a vascular malformation
versus less likely a chondroid lesion given the proximity to the
synchondrosis versus less likely metastatic disease/myeloma.
Recommend follow-up MRI of the pelvis without and with intravenous
contrast in 3 months. No evidence of a pathologic fracture.
2. Mild tendinosis of the left hamstring origin.

## 2019-04-14 MED ORDER — GADOBUTROL 1 MMOL/ML IV SOLN
9.0000 mL | Freq: Once | INTRAVENOUS | Status: AC | PRN
  Administered 2019-04-14: 9 mL via INTRAVENOUS

## 2019-04-16 ENCOUNTER — Other Ambulatory Visit: Payer: Self-pay

## 2019-04-16 ENCOUNTER — Telehealth: Payer: Self-pay | Admitting: Sports Medicine

## 2019-04-16 DIAGNOSIS — M25552 Pain in left hip: Secondary | ICD-10-CM

## 2019-04-16 NOTE — Telephone Encounter (Signed)
  I spoke with Joy Contreras on the phone today after reviewing the MRI of her pelvis with and without contrast.  The expansile bone lesion seen on her x-ray is most concerning for a vascular malformation but could also possibly be a chondroid lesion or metastatic disease.  Based on these findings I recommended a referral to Dr. Janice Coffin at [facility].  Patient is in agreement with that plan.  I will defer further work-up and treatment to the discretion of Dr. Mylo Red and the patient will follow up with me as needed.

## 2019-04-24 ENCOUNTER — Telehealth: Payer: Self-pay

## 2019-04-24 DIAGNOSIS — I429 Cardiomyopathy, unspecified: Secondary | ICD-10-CM

## 2019-04-24 NOTE — Telephone Encounter (Signed)
-----   Message from Josue Hector, MD sent at 04/03/2019 11:33 AM EST ----- MRI looks better than echo low normal EF Have her come to office for limited echo repeat GLS at no charge

## 2019-04-24 NOTE — Telephone Encounter (Signed)
Placed order for test. Echo department will be contacting patient.

## 2019-05-11 DIAGNOSIS — M899 Disorder of bone, unspecified: Secondary | ICD-10-CM | POA: Diagnosis not present

## 2019-05-11 DIAGNOSIS — R935 Abnormal findings on diagnostic imaging of other abdominal regions, including retroperitoneum: Secondary | ICD-10-CM | POA: Diagnosis not present

## 2019-05-11 DIAGNOSIS — M25552 Pain in left hip: Secondary | ICD-10-CM | POA: Diagnosis not present

## 2019-05-14 DIAGNOSIS — Z1231 Encounter for screening mammogram for malignant neoplasm of breast: Secondary | ICD-10-CM | POA: Diagnosis not present

## 2019-05-22 ENCOUNTER — Other Ambulatory Visit (HOSPITAL_COMMUNITY): Payer: 59

## 2019-05-28 MED FILL — CORLANOR 5 MG TABLET: 5 | 30 days supply | Qty: 60 | Fill #1

## 2019-05-28 MED FILL — CARVEDILOL 12.5 MG TABLET: 12.5 | 90 days supply | Qty: 180 | Fill #1

## 2019-05-29 ENCOUNTER — Other Ambulatory Visit: Payer: Self-pay

## 2019-05-29 ENCOUNTER — Ambulatory Visit (HOSPITAL_COMMUNITY): Payer: 59 | Attending: Cardiovascular Disease

## 2019-05-29 DIAGNOSIS — I429 Cardiomyopathy, unspecified: Secondary | ICD-10-CM

## 2019-06-25 ENCOUNTER — Other Ambulatory Visit (INDEPENDENT_AMBULATORY_CARE_PROVIDER_SITE_OTHER): Payer: 59

## 2019-06-25 ENCOUNTER — Other Ambulatory Visit: Payer: Self-pay

## 2019-06-25 ENCOUNTER — Ambulatory Visit (INDEPENDENT_AMBULATORY_CARE_PROVIDER_SITE_OTHER): Payer: 59 | Admitting: Sports Medicine

## 2019-06-25 VITALS — BP 134/94 | Ht 63.5 in | Wt 210.0 lb

## 2019-06-25 DIAGNOSIS — M25652 Stiffness of left hip, not elsewhere classified: Secondary | ICD-10-CM

## 2019-06-25 NOTE — Progress Notes (Signed)
   Subjective:    Patient ID: Joy Contreras, female    DOB: June 28, 1975, 44 y.o.   MRN: [id number]  HPI chief complaint: Left hip pain  Patient comes in today with persistent left hip pain.  She was last seen in the office in November.  Work-up of the left hip including x-ray and MRI showed an incidental finding of a bony lesion on the pubic ramus.  She was seen by Dr. Janice Coffin at [facility].  She recommended that a repeat x-ray be done in about 3 months.  The remainder of her MRI was fairly unremarkable other than some mild hamstring tendinopathy.  Patient continues to endorse deep-seated hip pain, including groin pain with certain activities.  She states she is unable to put her socks and shoes on without discomfort.  Any sort of internal or external rotation reproduces her pain.  It will radiate down her thigh.  She has not noticed numbness or tingling.  Symptoms have been present now since October.  She takes 800 mg of ibuprofen twice daily with some improvement.  Her pain will occasionally awaken her at night.  She also endorses difficulty sleeping on her left side.   Review of Systems    As above Objective:   Physical Exam  Well-developed, well-nourished.  No acute distress.  Awake alert and oriented x3.  Vital signs reviewed  Left hip: Continues to have somewhat limited internal and external range of motion actively and passively.  Internal and external rotation do reproduce groin pain.  Pain is also reproducible with resisted hip flexion.  5/5 strength with hip flexion.  4+/5 strength with resisted hip abduction.  Markedly positive Trendelenburg.  Neurovascularly intact distally.      Assessment & Plan:   Persistent left hip pain of unknown etiology  I am somewhat surprised that the MRI did not show any significant intra-articular hip pathology.  Given her hip weakness and stiffness, I recommend formal physical therapy.  Patient will follow up with me in 4 weeks for  reevaluation.  This can be a virtual visit if the patient prefers.  If symptoms persist despite initial physical therapy, then I would consider merits of a diagnostic/therapeutic intra-articular hip injection.  In the meantime, I would also like to check a sed rate to rule out any sort of an inflammatory arthropathy although this is less likely given her absence of effusion on MRI.

## 2019-06-26 LAB — SEDIMENTATION RATE: Sed Rate: 27 mm/hr (ref 0–32)

## 2019-07-09 ENCOUNTER — Telehealth: Payer: Self-pay

## 2019-07-09 NOTE — Telephone Encounter (Signed)
If pt calls back please tell her that her sed rate is normal. If she is not improving with PT, then Dr. Micheline Chapman wants her to call him to discuss next steps.

## 2019-07-30 ENCOUNTER — Ambulatory Visit: Payer: 59

## 2019-08-11 DIAGNOSIS — M461 Sacroiliitis, not elsewhere classified: Secondary | ICD-10-CM | POA: Diagnosis not present

## 2019-08-11 DIAGNOSIS — M160 Bilateral primary osteoarthritis of hip: Secondary | ICD-10-CM | POA: Diagnosis not present

## 2019-08-11 DIAGNOSIS — M25552 Pain in left hip: Secondary | ICD-10-CM | POA: Diagnosis not present

## 2019-08-11 DIAGNOSIS — M899 Disorder of bone, unspecified: Secondary | ICD-10-CM | POA: Diagnosis not present

## 2019-09-10 MED FILL — ESTRADIOL 0.1 MG/DAY PATCH: 0.1 | 84 days supply | Qty: 12 | Fill #1

## 2019-09-14 MED FILL — AMITRIPTYLINE HCL 100 MG TA: 100 | 90 days supply | Qty: 90 | Fill #1

## 2019-09-14 MED FILL — CORLANOR 5 MG TABLET: 5 | 30 days supply | Qty: 60 | Fill #2

## 2019-09-14 MED FILL — CARVEDILOL 12.5 MG TABLET: 12.5 | 90 days supply | Qty: 180 | Fill #2

## 2019-10-08 ENCOUNTER — Other Ambulatory Visit: Payer: Self-pay

## 2019-10-08 ENCOUNTER — Telehealth: Payer: Self-pay | Admitting: *Deleted

## 2019-10-08 ENCOUNTER — Encounter: Payer: Self-pay | Admitting: Internal Medicine

## 2019-10-08 ENCOUNTER — Ambulatory Visit (INDEPENDENT_AMBULATORY_CARE_PROVIDER_SITE_OTHER): Payer: 59 | Admitting: Internal Medicine

## 2019-10-08 ENCOUNTER — Other Ambulatory Visit: Payer: Self-pay | Admitting: Internal Medicine

## 2019-10-08 VITALS — BP 117/76 | HR 82 | Temp 98.1°F | Ht 63.5 in | Wt 203.0 lb

## 2019-10-08 DIAGNOSIS — R6889 Other general symptoms and signs: Secondary | ICD-10-CM | POA: Diagnosis not present

## 2019-10-08 DIAGNOSIS — R000 Tachycardia, unspecified: Secondary | ICD-10-CM

## 2019-10-08 DIAGNOSIS — R8290 Unspecified abnormal findings in urine: Secondary | ICD-10-CM

## 2019-10-08 DIAGNOSIS — E049 Nontoxic goiter, unspecified: Secondary | ICD-10-CM

## 2019-10-08 DIAGNOSIS — R5382 Chronic fatigue, unspecified: Secondary | ICD-10-CM | POA: Diagnosis not present

## 2019-10-08 DIAGNOSIS — R7309 Other abnormal glucose: Secondary | ICD-10-CM

## 2019-10-08 DIAGNOSIS — M25559 Pain in unspecified hip: Secondary | ICD-10-CM | POA: Diagnosis not present

## 2019-10-08 DIAGNOSIS — R748 Abnormal levels of other serum enzymes: Secondary | ICD-10-CM

## 2019-10-08 DIAGNOSIS — D509 Iron deficiency anemia, unspecified: Secondary | ICD-10-CM

## 2019-10-08 DIAGNOSIS — K219 Gastro-esophageal reflux disease without esophagitis: Secondary | ICD-10-CM

## 2019-10-08 DIAGNOSIS — E559 Vitamin D deficiency, unspecified: Secondary | ICD-10-CM

## 2019-10-08 DIAGNOSIS — G4700 Insomnia, unspecified: Secondary | ICD-10-CM | POA: Diagnosis not present

## 2019-10-08 DIAGNOSIS — M25552 Pain in left hip: Secondary | ICD-10-CM

## 2019-10-08 LAB — POCT GLYCOSYLATED HEMOGLOBIN (HGB A1C): Hemoglobin A1C: 5.6 % (ref 4.0–5.6)

## 2019-10-08 LAB — GLUCOSE, CAPILLARY: Glucose-Capillary: 93 mg/dL (ref 70–99)

## 2019-10-08 MED ORDER — PANTOPRAZOLE SODIUM 40 MG PO TBEC: 40.0000 mg | DELAYED_RELEASE_TABLET | ORAL | 3 refills | Status: DC | PRN

## 2019-10-08 MED ORDER — RAMELTEON 8 MG PO TABS: 8.0000 mg | ORAL_TABLET | Freq: Every evening | ORAL | 3 refills | Status: DC | PRN

## 2019-10-08 MED ORDER — IVABRADINE HCL 5 MG PO TABS: 2.5000 mg | ORAL_TABLET | Freq: Two times a day (BID) | ORAL | 3 refills | Status: DC

## 2019-10-08 MED ORDER — TRAZODONE HCL 50 MG PO TABS: 50.0000 mg | ORAL_TABLET | Freq: Every evening | ORAL | 1 refills | Status: DC | PRN

## 2019-10-08 MED ORDER — SAXENDA 18 MG/3ML ~~LOC~~ SOPN: 3.0000 mg | PEN_INJECTOR | Freq: Every day | SUBCUTANEOUS | 5 refills | Status: DC

## 2019-10-08 MED ORDER — SULFAMETHOXAZOLE-TRIMETHOPRIM 400-80 MG PO TABS: ORAL_TABLET | ORAL | 0 refills | Status: DC

## 2019-10-08 MED ORDER — CARVEDILOL 12.5 MG PO TABS: 12.5000 mg | ORAL_TABLET | Freq: Two times a day (BID) | ORAL | 3 refills | Status: DC

## 2019-10-08 MED FILL — SULFAMETHOXAZOLE-TMP SS TAB: 400-80 | 30 days supply | Qty: 30 | Fill #0

## 2019-10-08 MED FILL — traZODone HCL 50 MG TABS: 50 | 90 days supply | Qty: 90 | Fill #0

## 2019-10-08 MED FILL — PANTOPRAZOLE SOD DR 40 MG T: 40 | 90 days supply | Qty: 90 | Fill #0

## 2019-10-08 NOTE — Telephone Encounter (Signed)
Call from [place], [place] this morning - stated need clarification on Pantoprazole, as needed or daily. And cost of [place] is $162.00 and not covered by insurance, do you want to go ahead and fill Tazadone?  Then [facility] went off line.  I talked to Dr Lynnae January - stated Pantoprazole is to be taken daily and ok to fill Trazadone rx.  Talked to [place] - Verbal order given "Pantoprazole 40 mg by mouth daily" and to fill Trazadone per Dr Lynnae January.

## 2019-10-09 DIAGNOSIS — E669 Obesity, unspecified: Secondary | ICD-10-CM | POA: Insufficient documentation

## 2019-10-09 LAB — CMP14 + ANION GAP
ALT: 19 IU/L (ref 0–32)
AST: 18 IU/L (ref 0–40)
Albumin/Globulin Ratio: 1.5 (ref 1.2–2.2)
Albumin: 4.4 g/dL (ref 3.8–4.8)
Alkaline Phosphatase: 128 IU/L — ABNORMAL HIGH (ref 48–121)
Anion Gap: 12 mmol/L (ref 10.0–18.0)
BUN/Creatinine Ratio: 20 (ref 9–23)
BUN: 12 mg/dL (ref 6–24)
Bilirubin Total: <0.2 mg/dL (ref 0.0–1.2)
CO2: 23 mmol/L (ref 20–29)
Calcium: 9.3 mg/dL (ref 8.7–10.2)
Chloride: 106 mmol/L (ref 96–106)
Creatinine, Ser: 0.59 mg/dL (ref 0.57–1.00)
GFR calc Af Amer: 129 mL/min/{1.73_m2} (ref >59)
GFR calc non Af Amer: 112 mL/min/{1.73_m2} (ref >59)
Globulin, Total: 2.9 g/dL (ref 1.5–4.5)
Glucose: 91 mg/dL (ref 65–99)
Potassium: 4.2 mmol/L (ref 3.5–5.2)
Sodium: 141 mmol/L (ref 134–144)
Total Protein: 7.3 g/dL (ref 6.0–8.5)

## 2019-10-09 LAB — CBC WITH DIFFERENTIAL/PLATELET
Basophils Absolute: 0.1 10*3/uL (ref 0.0–0.2)
Basos: 1 %
EOS (ABSOLUTE): 0.1 10*3/uL (ref 0.0–0.4)
Eos: 2 %
Hematocrit: 41.8 % (ref 34.0–46.6)
Hemoglobin: 14.4 g/dL (ref 11.1–15.9)
Immature Grans (Abs): 0 10*3/uL (ref 0.0–0.1)
Immature Granulocytes: 0 %
Lymphocytes Absolute: 2.5 10*3/uL (ref 0.7–3.1)
Lymphs: 43 %
MCH: 29.4 pg (ref 26.6–33.0)
MCHC: 34.4 g/dL (ref 31.5–35.7)
MCV: 86 fL (ref 79–97)
Monocytes Absolute: 0.5 10*3/uL (ref 0.1–0.9)
Monocytes: 9 %
Neutrophils Absolute: 2.5 10*3/uL (ref 1.4–7.0)
Neutrophils: 45 %
Platelets: 244 10*3/uL (ref 150–450)
RBC: 4.89 x10E6/uL (ref 3.77–5.28)
RDW: 12.9 % (ref 11.7–15.4)
WBC: 5.7 10*3/uL (ref 3.4–10.8)

## 2019-10-09 LAB — URINALYSIS, ROUTINE W REFLEX MICROSCOPIC
Bilirubin, UA: NEGATIVE
Glucose, UA: NEGATIVE
Ketones, UA: NEGATIVE
Leukocytes,UA: NEGATIVE
Nitrite, UA: NEGATIVE
Protein,UA: NEGATIVE
RBC, UA: NEGATIVE
Specific Gravity, UA: 1.022 (ref 1.005–1.030)
Urobilinogen, Ur: 1 mg/dL (ref 0.2–1.0)
pH, UA: 6.5 (ref 5.0–7.5)

## 2019-10-09 LAB — VITAMIN B12: Vitamin B-12: 678 pg/mL (ref 232–1245)

## 2019-10-09 LAB — TSH: TSH: 0.869 u[IU]/mL (ref 0.450–4.500)

## 2019-10-09 LAB — FERRITIN: Ferritin: 121 ng/mL (ref 15–150)

## 2019-10-09 LAB — VITAMIN D 25 HYDROXY (VIT D DEFICIENCY, FRACTURES): Vit D, 25-Hydroxy: 19.2 ng/mL — ABNORMAL LOW (ref 30.0–100.0)

## 2019-10-09 LAB — T4, FREE: Free T4: 1.02 ng/dL (ref 0.82–1.77)

## 2019-10-09 NOTE — Assessment & Plan Note (Signed)
This problem is chronic and uncontrolled.  She uses the amitriptyline as needed as it leaves her sluggish the next day.  She has tried Ambien and Lunesta but this also caused sluggishness.  She remains tired.  She agrees to get a sleep study and that is being scheduled.  We were going to try Remelteon but it is not covered by insurance and cost close to $200.  She asked about trying trazodone which is covered.  We reviewed the side effects which still include fatigue and sedation the subsequent day but she will give it a try.  PLAN : Stop amitriptyline Trial of trazodone Sleep study, split-night in lab

## 2019-10-09 NOTE — Assessment & Plan Note (Signed)
This problem is chronic and controlled.  She uses her PPI as needed and I sent in a refill.  She has no side effects to this medication.  PLAN:  Cont current meds

## 2019-10-09 NOTE — Progress Notes (Signed)
   Subjective:    Patient ID: Joy Contreras, female    DOB: 1975/11/07, 44 y.o.   MRN: [id number]  HPI  Joy Contreras is here for yearly insurance physical and F/U tachycardia. Please see the A&P for the status of the pt's chronic medical problems.  ROS : per ROS section and in problem oriented charting. All other systems are negative.  PMHx, Soc hx, and / or Fam hx : married, works at [employer]  Genitourinary: Negative for dysuria and frequency.  Musculoskeletal: Positive for arthralgias and gait problem.  Psychiatric/Behavioral: Positive for sleep disturbance.       Objective:   Physical Exam Constitutional:      General: She is not in acute distress.    Appearance: Normal appearance. She is not ill-appearing, toxic-appearing or diaphoretic.  HENT:     Head: Normocephalic and atraumatic.     Right Ear: External ear normal.     Left Ear: External ear normal.  Eyes:     Extraocular Movements: Extraocular movements intact.     Conjunctiva/sclera: Conjunctivae normal.  Neck:     Comments: Prominent L thyroid gland Cardiovascular:     Rate and Rhythm: Normal rate and regular rhythm.     Heart sounds: No murmur.  Pulmonary:     Effort: Pulmonary effort is normal.     Breath sounds: Normal breath sounds.  Musculoskeletal:        General: No swelling, tenderness, deformity or signs of injury.     Cervical back: No tenderness.     Right lower leg: No edema.     Left lower leg: No edema.  Lymphadenopathy:     Cervical: No cervical adenopathy.  Skin:    General: Skin is warm and dry.  Neurological:     General: No focal deficit present.     Mental Status: She is alert. Mental status is at baseline.  Psychiatric:        Mood and Affect: Mood normal.        Behavior: Behavior normal.        Thought Content: Thought content normal.        Judgment: Judgment normal.       Assessment & Plan:

## 2019-10-09 NOTE — Assessment & Plan Note (Signed)
This problem is chronic and uncontrolled.  Vitamin D level returned at 19.  I will recommend vitamin D 1000 international units for 3 months and repeat vitamin D level at that time.  PLAN : Vitamin D3 supplementation Vitamin D level in 3 months

## 2019-10-09 NOTE — Assessment & Plan Note (Signed)
This problem is chronic and controlled.  My prior examinations documented thyromegaly, most frequently right.  Today, I thought the left lobe was enlarged.  There were no nodules.  TSH and free T4 were checked and were within the normal range.  PLAN : Follow

## 2019-10-09 NOTE — Assessment & Plan Note (Signed)
This problem is chronic and controlled.  She has been seeing cardiology and had a repeat echo that showed left ventricular global hypokinesis which led to a cardiac MRI that showed a low normal EF of 53%.  Plasma metanephrines were checked and were normal.  She was started on ivabradine 5 mg and is taking 1/2 pill twice a day.  Her heart rate has decreased down into the 80s.  She remains on carvedilol 12.5 twice daily.  She has noticed decreased palpitations and tachycardia with this medication regimen.  She will follow up with cardiology.  PLAN:  Cont current meds F/U cards

## 2019-10-09 NOTE — Assessment & Plan Note (Signed)
This problem is chronic and uncontrolled.  It started in her left that now she is having right hip pain and knee pain.  She was seen by Dr. Draper who started with a plain film and an MRI.  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 Emery at [facility] who recommended a plain film x-ray in March 2021 which is overdue.  We discussed and due to worsening pain and involvement of the contralateral side, we will pursue an MRI of the pelvis which has been ordered.  She has not been able to complete PT due to her work schedule.  She is taking aleve which helps and I strongly recommended for her to take it with food and to try combining it with acetaminophen.  Of note, her alk phos is slightly elevated at 128 and it has significantly increased from prior levels.  If still elevated in September, I will check bone specific alkaline phosphatase.  She is also repeating her MRI.  PLAN : MRI Repeat alk phos in September 

## 2019-10-09 NOTE — Assessment & Plan Note (Signed)
This problem is chronic and controlled.  Due to fatigue, I will check a CBC with differential, vitamin B12, and a ferritin level.  All returned normal.  PLAN : follow

## 2019-10-09 NOTE — Assessment & Plan Note (Signed)
This problem is chronic and uncontrolled.  She had good response to liraglutide and that it decreased her appetite.  She got tired of injections and stopped it but would like to get 1 more round a try.  She is not diabetic, has never been diabetic, and the liraglutide is not to treat diabetes.  It is being used solely to treat weight.  PLAN : Liraglutide for weight management

## 2019-10-10 LAB — URINE CULTURE

## 2019-10-13 ENCOUNTER — Other Ambulatory Visit: Payer: Self-pay | Admitting: Internal Medicine

## 2019-10-13 MED ORDER — FLUCONAZOLE 150 MG PO TABS: 150.0000 mg | ORAL_TABLET | Freq: Once | ORAL | 0 refills | Status: AC

## 2019-10-13 MED ORDER — SULFAMETHOXAZOLE-TRIMETHOPRIM 800-160 MG PO TABS: 1.0000 | ORAL_TABLET | Freq: Two times a day (BID) | ORAL | 0 refills | Status: DC

## 2019-10-13 MED FILL — SULFAMETHOXAZOLE-TMP DS TAB: 800-160 | 3 days supply | Qty: 6 | Fill #0

## 2019-10-13 MED FILL — FLUCONAZOLE 150 MG TABLET: 150 | 1 days supply | Qty: 1 | Fill #0

## 2019-10-26 ENCOUNTER — Ambulatory Visit (HOSPITAL_COMMUNITY): Payer: 59

## 2019-10-30 MED FILL — SAXENDA 18 MG/3 ML PEN: 18 | 30 days supply | Qty: 15 | Fill #0

## 2019-11-09 ENCOUNTER — Ambulatory Visit (HOSPITAL_COMMUNITY)
Admission: RE | Admit: 2019-11-09 | Discharge: 2019-11-09 | Disposition: A | Discharge status: Home / Self Care | Payer: 59 | Source: Ambulatory Visit | Attending: Internal Medicine | Admitting: Internal Medicine

## 2019-11-09 ENCOUNTER — Other Ambulatory Visit: Payer: Self-pay

## 2019-11-09 DIAGNOSIS — M25452 Effusion, left hip: Secondary | ICD-10-CM | POA: Diagnosis not present

## 2019-11-09 DIAGNOSIS — M25559 Pain in unspecified hip: Secondary | ICD-10-CM | POA: Insufficient documentation

## 2019-11-09 IMAGING — MR MR PELVIS WO/W CM
5 of 15 series · 18 of 48 positions shown · IV contrast (gadavist)
Comparison: Prior study [DATE]

CLINICAL DATA: Follow-up pelvic bone lesion.

EXAM:
MRI PELVIS WITHOUT AND WITH CONTRAST
TECHNIQUE: Multiplanar multisequence MR imaging of the pelvis was performed
both before and after administration of intravenous contrast.
CONTRAST:  9mL GADAVIST GADOBUTROL 1 MMOL/ML IV SOLN

[Series 2: T1 fat-sat · axial · 4.0mm · 0.70mm/px · z∈[-105,+125]mm · 4 of 47 slices shown (1 of 2)]
[im 1/47]
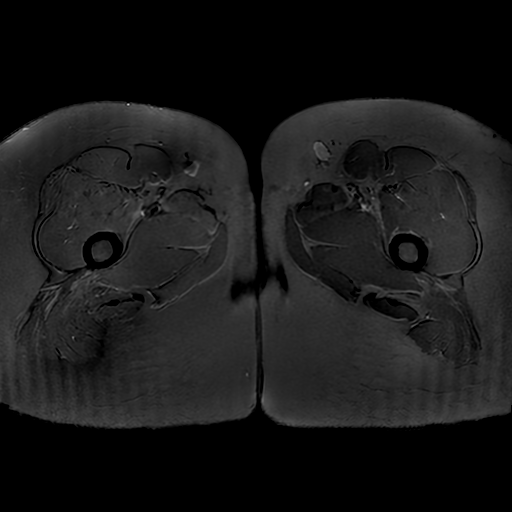
[im 16/47]
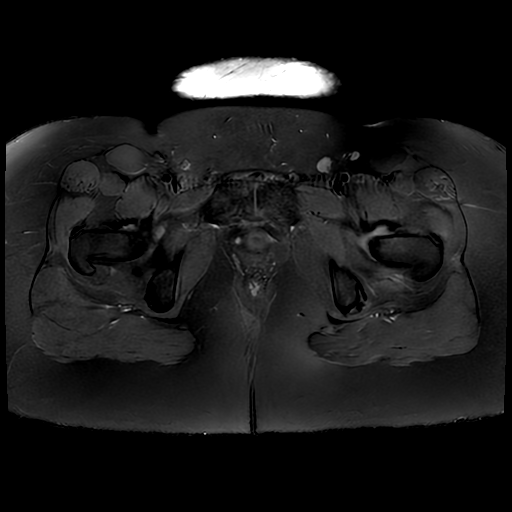
[im 31/47]
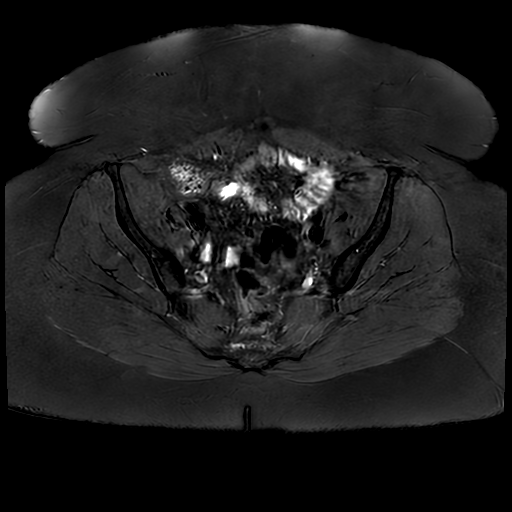
[im 47/47]
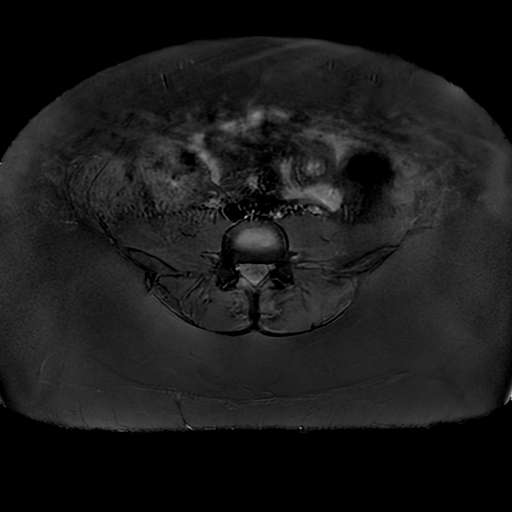

[Series 3: T2 fat-sat · sagittal · 4.0mm · 0.62mm/px · 5 of 65 slices shown (1 of 2)]
[im 1/65]
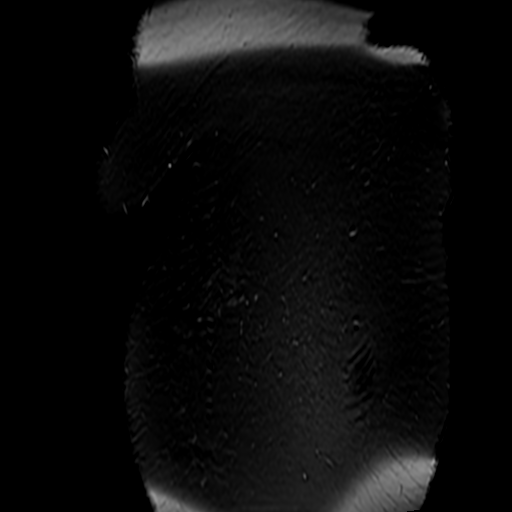
[im 17/65]
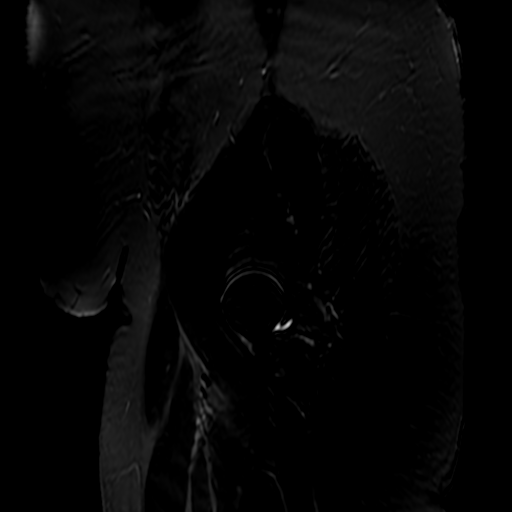
[im 33/65]
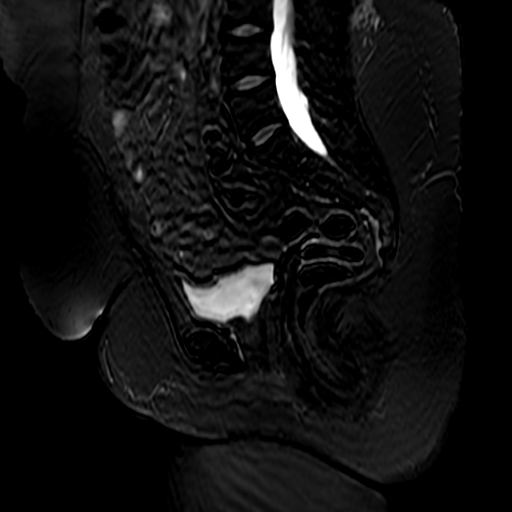
[im 49/65]
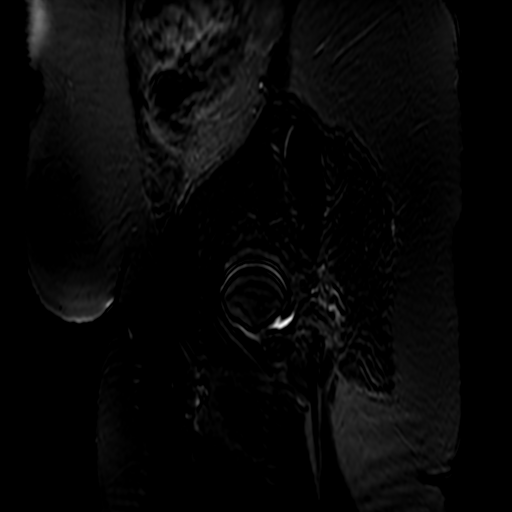
[im 65/65]
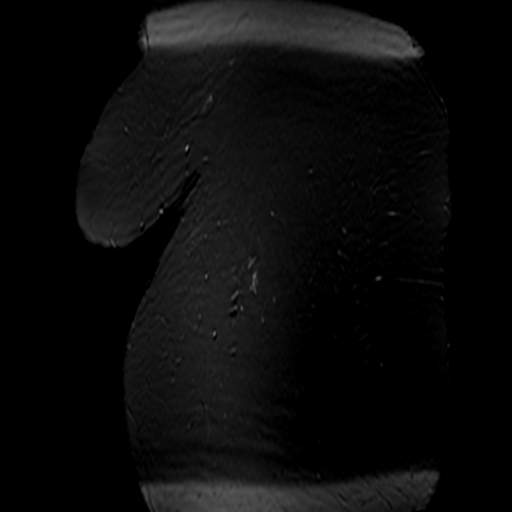

[Series 4: T1 fat-sat · axial · 4.0mm · 0.70mm/px · z∈[-105,+125]mm · 3 of 47 slices shown (2 of 2)]
[im 1/47]
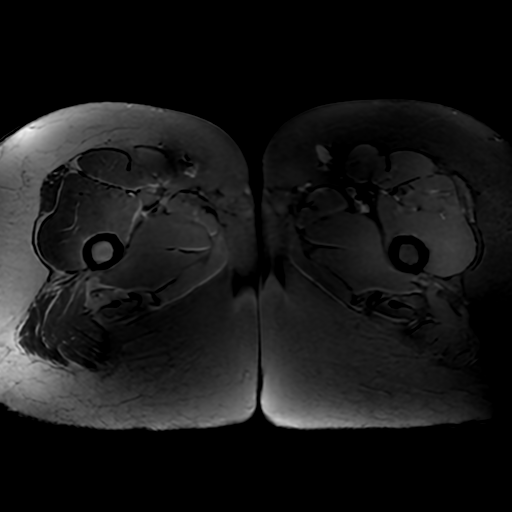
[im 24/47]
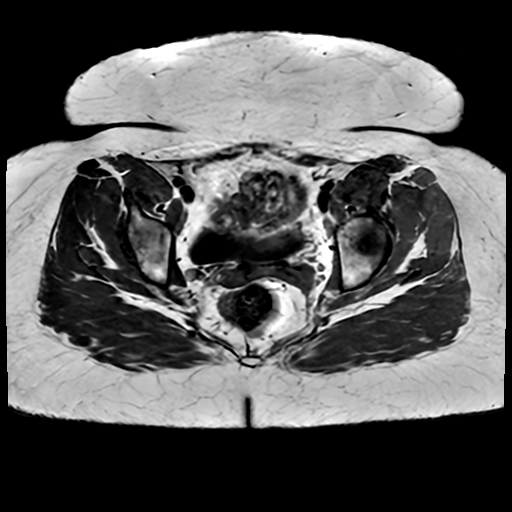
[im 47/47]
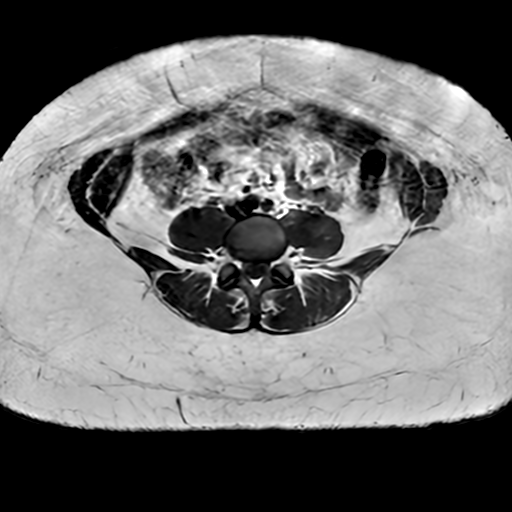

[Series 7: T1 · coronal · 4.0mm · 0.78mm/px · 3 of 42 slices shown]
[im 1/42]
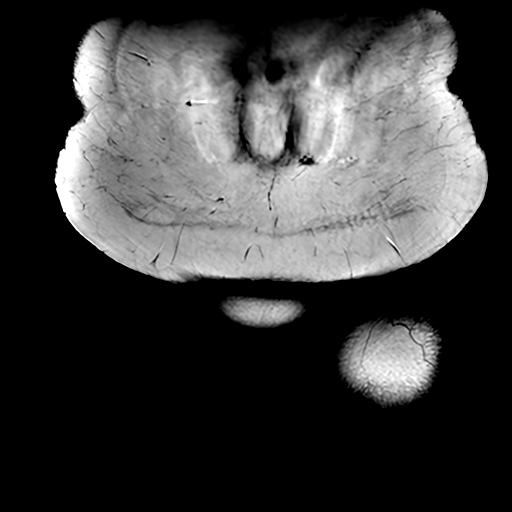
[im 21/42]
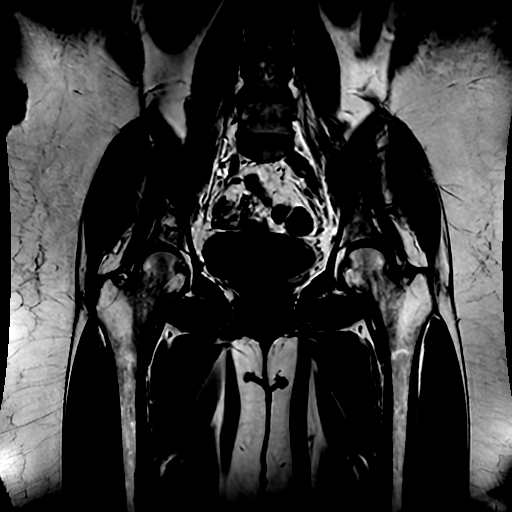
[im 42/42]
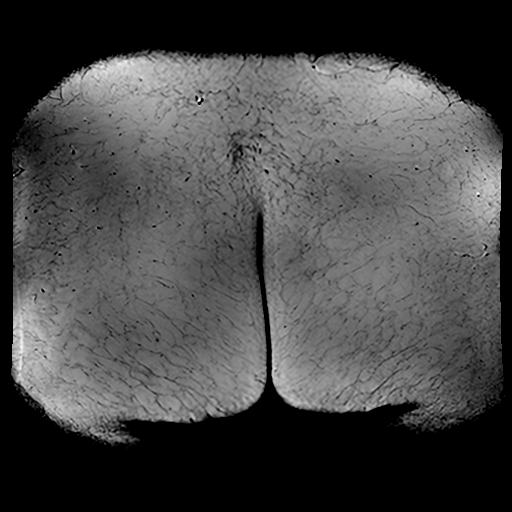

[Series 8: T2 fat-sat · sagittal · 4.0mm · 0.62mm/px · 3 of 65 slices shown (2 of 2)]
[im 1/65]
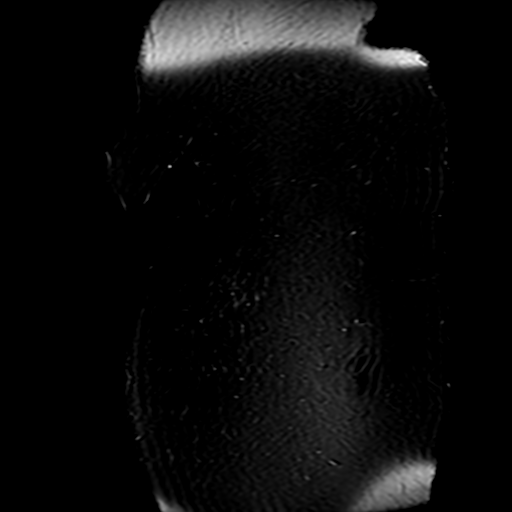
[im 22/65]
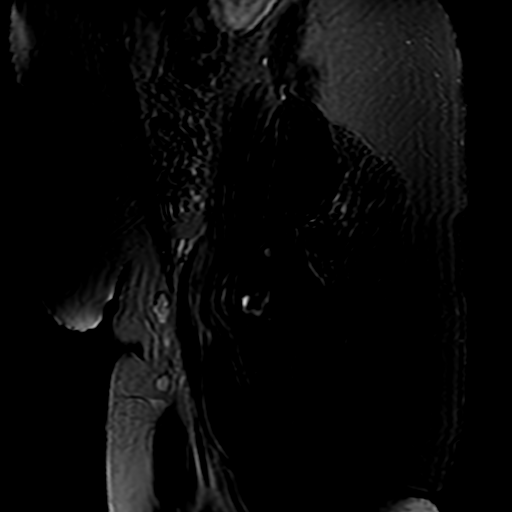
[im 43/65]
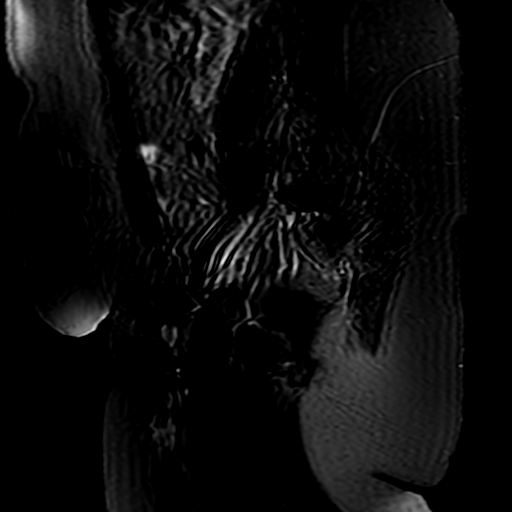

[18 of 48 positions shown; findings below may reference images not displayed]

FINDINGS: Urinary Tract:  The bladder is unremarkable.

Bowel: The rectum, sigmoid colon and visualized small bowel loops
are grossly normal.

Vascular/Lymphatic: Normal vascular structures. No pelvic
lymphadenopathy.

Reproductive:  Surgically absent.

Other: No free pelvic fluid collections. No inguinal mass or
adenopathy.

Musculoskeletal: Stable lesion in the left inferior pubic ramus.
Multi septated serpiginous appearing lesion demonstrating low T1 and
high T2 signal intensity and subsequent contrast enhancement.
Probable intra and extra osseous component. No interval change since
the prior examination. I do not see any aggressive imaging features
such as adjacent soft tissue mass or involvement of the surrounding
musculature. As stated previously this could be a benign hemangioma
or other benign vascular lesion. I think enchondroma is unlikely and
it does not have the appearance of metastatic disease or myeloma.
Six months of stability is certainly reassuring.

No other bone lesions are identified.

There is a persistent small left hip joint effusion but no stress
fracture or AVN.

Mild left hamstring tendinopathy the appears stable. No tear or
rupture.
IMPRESSION: 1. Stable lesion in the left inferior pubic ramus. No interval
change since the prior examination from 6 months ago. I think this
could be a benign hemangioma or other benign vascular lesion. No
evidence of pathologic fracture but a pelvic CT scan may be helpful
for further evaluation of the bony matrix and to evaluate for any
evidence of cortical breakthrough. Depending on the CT scan results,
I think a follow-up MRI in 1 year is reasonable.
2. Stable small left hip joint effusion but no stress fracture or
AVN.
3. Stable mild left hamstring tendinopathy.

## 2019-11-09 MED ORDER — GADOBUTROL 1 MMOL/ML IV SOLN
9.0000 mL | Freq: Once | INTRAVENOUS | Status: AC | PRN
  Administered 2019-11-09: 9 mL via INTRAVENOUS

## 2019-12-08 ENCOUNTER — Encounter: Payer: Self-pay | Admitting: Cardiovascular Disease

## 2019-12-23 MED FILL — ESTRADIOL 0.1 MG/DAY PATCH: 0.1 | 84 days supply | Qty: 12 | Fill #2

## 2020-03-11 ENCOUNTER — Telehealth: Payer: Self-pay | Admitting: Cardiovascular Disease

## 2020-03-11 ENCOUNTER — Other Ambulatory Visit: Payer: Self-pay | Admitting: Internal Medicine

## 2020-03-11 ENCOUNTER — Ambulatory Visit (HOSPITAL_COMMUNITY)
Admission: RE | Admit: 2020-03-11 | Discharge: 2020-03-11 | Disposition: A | Discharge status: Home / Self Care | Payer: 59 | Source: Ambulatory Visit | Attending: Internal Medicine | Admitting: Internal Medicine

## 2020-03-11 ENCOUNTER — Telehealth: Payer: Self-pay

## 2020-03-11 DIAGNOSIS — R002 Palpitations: Secondary | ICD-10-CM | POA: Diagnosis not present

## 2020-03-11 MED ORDER — NITROGLYCERIN 0.4 MG SL SUBL: 0.4000 mg | SUBLINGUAL_TABLET | SUBLINGUAL | 3 refills | Status: DC | PRN

## 2020-03-11 MED FILL — NITROGLYCERIN 0.4 MG TAB SL: 0.4 | 25 days supply | Qty: 25 | Fill #0

## 2020-03-11 NOTE — Telephone Encounter (Signed)
44 yo Diabetic F with low normal EF with no WMA. No LGE or WMA on 04/03/19 CMR. - can offer nitro PRN BP stable - will see Dr. Johnsie Cancel next week for virtual - if escalating pain or red flag symptoms, would need ED eval for unstable angina as appropriate - low threshold for CCTA +/- FFR

## 2020-03-11 NOTE — Telephone Encounter (Signed)
Patient aware of recommendations. Will send nitro to patient's pharmacy. Patient will still have an EKG done so it will be on her chart for her virtual visit with Truitt Merle NP next week.

## 2020-03-11 NOTE — Telephone Encounter (Signed)
Pt c/o of Chest Pain: STAT if CP now or developed within 24 hours  1. Are you having CP right now?  No  2. Are you experiencing any other symptoms (ex. SOB, nausea, vomiting, sweating)? No  3. How long have you been experiencing CP? About 3 weeks  4. Is your CP continuous or coming and going? Coming and going  5. Have you taken Nitroglycerin? Never had any      I received a message in [place] calls this morning. I made her a Virtual Visit on 03-16-20 with Truitt Merle. Please call pt to evaluate. ?

## 2020-03-11 NOTE — Addendum Note (Signed)
Addended by: Aris Georgia, Verleen Stuckey L on: 03/11/2020 11:57 AM   Modules accepted: Orders

## 2020-03-11 NOTE — Telephone Encounter (Signed)
Called patient back about her message. Patient complained of having chest discomfort mid sternal for the last couple of weeks that comes and goes, and can last up to 5 minutes.  Patient through it was her acid reflux and starting taking protonix, but this did not help. Patient stated it comes on in stressful situations. Patient denies any SOB, but she states she has palpitations and irregular heart beat at times. Patient is going to get an EKG done at her work and fax it over.  Patient stated if she relaxes discomfort does go away, but usually just goes away on its own. Will consult DOD, Dr. Gasper Sells.

## 2020-03-14 NOTE — Progress Notes (Deleted)
Telehealth Visit  {Choose 1 Note Type (Telehealth Visit or Telephone Visit):[phone]}   Evaluation Performed:  Follow-up visit   The patient was identified using 2 identifiers.   This visit type was conducted due to national recommendations for restrictions regarding the COVID-19 Pandemic (e.g. social distancing).  This format is felt to be most appropriate for this patient at this time.  All issues noted in this document were discussed and addressed.  No physical exam was performed (except for noted visual exam findings with Video Visits).  Please refer to the patient's chart (MyChart message for video visits and phone note for telephone visits) for the patient's consent to telehealth for [facility].  Date:  03/14/2020   ID:  Joy Contreras, DOB [birth year], MRN [id number]  Patient Location:  ***  Provider location:   ***  PCP:  Sid Falcon, MD  Cardiologist:  Servando Snare & No primary care provider on file.  Electrophysiologist:  None   Chief Complaint:  ***  History of Present Illness:    Joy Contreras is a 44 y.o. female who presents via audio/video conferencing for a telehealth visit today.  Seen for *** referred by Dr Lynnae January for tachycardia. This has been known issue for over a year. She has insomnia and previous iron deficiency anemia that is improved. She has had normal thyroid studies. She is on coreg 12.5 bid. Holter 2019 showed HR;s always over 100 with no nocturnal dip.Also noted some PvC;s  Echo done 01/02/19 with EF 45-50% mild LVE/LVH  , GLS abnormal -9.1 and MAC   The patient {does/does not:200015} have symptoms concerning for COVID-19 infection (fever, chills, cough, or new shortness of breath).   Seen today via ***. *** has consented for this visit.    Past Medical History:  Diagnosis Date  . Depression    History of Depression  . Dysrhythmia    tachycardia  . GERD (gastroesophageal reflux disease)    Tx with PPI  . Goiter 2006   Dr  Altheimer. Dx with Autonomously functioning goiter during pregnancy. anti-TPO negative.   . Menorrhagia    Being tx by GYN  . Tachycardia    Required BB during pregnancy. Baseline 100-130 at rest   Past Surgical History:  Procedure Laterality Date  . BILATERAL SALPINGECTOMY Bilateral 11/01/2014   Procedure: BILATERAL SALPINGECTOMY;  Surgeon: Servando Salina, MD;  Location: [facility];  Service: Gynecology;  Laterality: Bilateral;  . CESAREAN SECTION  11/22/2001  . LEFT OOPHORECTOMY  2002   Dr Garwin Brothers. 2/2 symptomatice fibroma  . LYSIS OF ADHESION N/A 11/01/2014   Procedure: LYSIS OF ADHESION;  Surgeon: Servando Salina, MD;  Location: [facility];  Service: Gynecology;  Laterality: N/A;  . 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 ROBOTIC ASSISTED SALPINGO OOPHERECTOMY Right 12/27/2016   Procedure: ROBOTIC ASSISTED RIGHT OOPHORECTOMY AND PERITONEAL BIOPSY;  Surgeon: Janie Morning, MD;  Location: [facility];  Service: Gynecology;  Laterality: Right;  . ROBOTIC ASSISTED TOTAL HYSTERECTOMY N/A 11/01/2014   Procedure: ROBOTIC ASSISTED TOTAL HYSTERECTOMY;  Surgeon: Servando Salina, MD;  Location: [facility];  Service: Gynecology;  Laterality: N/A;     No outpatient medications have been marked as taking for the 03/16/20 encounter (Appointment) with Burtis Junes, NP.     Allergies:   Patient has no known allergies.   Social History   Tobacco Use  . Smoking status: Never Smoker  . Smokeless tobacco: Never Used  Substance Use Topics  . Alcohol use: Yes  Alcohol/week: 0.0 standard drinks    Comment: occassionally   . Drug use: No     Family Hx: The patient's family history includes ADD / ADHD in her son; Bipolar disorder in her mother; Hypertension in her mother; Mental illness in her brother; Schizophrenia in her brother; Seizures in her father; Stickler syndrome in her sister.  ROS:   Please see the history of present illness.   All other systems reviewed are negative except for  ***.    Objective:    Vital Signs:  LMP 02/02/2013    Wt Readings from Last 3 Encounters:  10/08/19 203 lb (92.1 kg)  06/25/19 210 lb (95.3 kg)  04/02/19 203 lb (92.1 kg)    Alert female in no acute distress.   Labs/Other Tests and Data Reviewed:    Lab Results  Component Value Date   WBC 5.7 10/08/2019   HGB 14.4 10/08/2019   HCT 41.8 10/08/2019   PLT 244 10/08/2019   GLUCOSE 91 10/08/2019   CHOL 189 07/21/2015   TRIG 66 07/21/2015   HDL 65 07/21/2015   LDLCALC 111 (H) 07/21/2015   ALT 19 10/08/2019   AST 18 10/08/2019   NA 141 10/08/2019   K 4.2 10/08/2019   CL 106 10/08/2019   CREATININE 0.59 10/08/2019   BUN 12 10/08/2019   CO2 23 10/08/2019   TSH 0.869 10/08/2019   HGBA1C 5.6 10/08/2019     BNP (last 3 results) No results for input(s): BNP in the last 8760 hours.  ProBNP (last 3 results) No results for input(s): PROBNP in the last 8760 hours.    Prior CV studies:    The following studies were reviewed today:  Werner Lean, MD   [facility]  03/11/20 10:34 AM Note 44 yo Diabetic F with low normal EF with no WMA. No LGE or WMA on 04/03/19 CMR. - can offer nitro PRN BP stable - will see Dr. Johnsie Cancel next week for virtual - if escalating pain or red flag symptoms, would need ED eval for unstable angina as appropriate - low threshold for CCTA +/- FFR        ECHO IMPRESSIONS 05/2019  1. Left ventricular ejection fraction, by visual estimation, is 50 to  55%. The left ventricle has low normal function. There is no increased  left ventricular wall thickness.  2. The left ventricle demonstrates global hypokinesis.  3. The mitral valve is normal in structure. No evidence of mitral valve  regurgitation.  4. The tricuspid valve was normal in structure. Tricuspid valve  regurgitation is not demonstrated.  5. Tricuspid valve regurgitation is not demonstrated.  6. Left ventricular ejection fraction by 3D volume is is 48 %.    ASSESSMENT &  PLAN:    1.   1. Tachycardia:  In setting of DM, likely some autonomic dysfunction Recent echo suggests EF mildly reduced now GLS markedly abnormal . I think she should have cardiac MRI to further investigate the myocardium. Given elevated HR despite beta blocker and decreased EF I think she should have a trial of Ivabradine. She has not had blood work or imaging to r/o Pheo  Will order plasma metanephrines   . COVID-19 Education: The signs and symptoms of COVID-19 were discussed with the patient and how to seek care for testing (follow up with PCP or arrange E-visit).  The importance of social distancing, staying at home, hand hygiene and wearing a mask when out in public were discussed today.  Patient Risk:  After full review of this patient's clinical status, I feel that they are at least moderate risk at this time.  Time:   Today, I have spent *** minutes with the patient with telehealth technology discussing the above issues.     Medication Adjustments/Labs and Tests Ordered: Current medicines are reviewed at length with the patient today.  Concerns regarding medicines are outlined above.   Tests Ordered: No orders of the defined types were placed in this encounter.   Medication Changes: No orders of the defined types were placed in this encounter.   Disposition:  FU with *** in {gen number [id number]} {Days to years:10300}.   Patient is agreeable to this plan and will call if any problems develop in the interim.   Amie Critchley, NP  03/14/2020 8:10 AM    [place]

## 2020-03-15 NOTE — Progress Notes (Deleted)
CARDIOLOGY OFFICE NOTE  Date:  03/15/2020    [name] Date of Birth: 05-14-1976 Medical Record #[id number]  PCP:  Sid Falcon, MD  Cardiologist:  Johnsie Cancel    No chief complaint on file.   History of Present Illness: Joy Contreras is a 44 y.o. female who presents today for a work in visit. Seen for Dr. Johnsie Cancel.   She was seen here originally in September of 2020 for tachycardia - has history of insomnia and iron deficiency anemia. No known CAD. Prior Holter with HRs over 100 with no nocturnal dip and PVCs - Echo with low normal EF at 45 to 50% and mild LVH.   Comes in today. Here with   Past Medical History:  Diagnosis Date  . Depression    History of Depression  . Dysrhythmia    tachycardia  . GERD (gastroesophageal reflux disease)    Tx with PPI  . Goiter 2006   Dr Altheimer. Dx with Autonomously functioning goiter during pregnancy. anti-TPO negative.   . Menorrhagia    Being tx by GYN  . Tachycardia    Required BB during pregnancy. Baseline 100-130 at rest    Past Surgical History:  Procedure Laterality Date  . BILATERAL SALPINGECTOMY Bilateral 11/01/2014   Procedure: BILATERAL SALPINGECTOMY;  Surgeon: Servando Salina, MD;  Location: [facility];  Service: Gynecology;  Laterality: Bilateral;  . CESAREAN SECTION  11/22/2001  . LEFT OOPHORECTOMY  2002   Dr Garwin Brothers. 2/2 symptomatice fibroma  . LYSIS OF ADHESION N/A 11/01/2014   Procedure: LYSIS OF ADHESION;  Surgeon: Servando Salina, MD;  Location: [facility];  Service: Gynecology;  Laterality: N/A;  . 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 ROBOTIC ASSISTED SALPINGO OOPHERECTOMY Right 12/27/2016   Procedure: ROBOTIC ASSISTED RIGHT OOPHORECTOMY AND PERITONEAL BIOPSY;  Surgeon: Janie Morning, MD;  Location: [facility];  Service: Gynecology;  Laterality: Right;  . ROBOTIC ASSISTED TOTAL HYSTERECTOMY N/A 11/01/2014   Procedure: ROBOTIC ASSISTED TOTAL HYSTERECTOMY;  Surgeon: Servando Salina, MD;   Location: [facility];  Service: Gynecology;  Laterality: N/A;     Medications: No outpatient medications have been marked as taking for the 03/16/20 encounter (Appointment) with Burtis Junes, NP.     Allergies: No Known Allergies  Social History: The patient  reports that she has never smoked. She has never used smokeless tobacco. She reports current alcohol use. She reports that she does not use drugs.   Family History: The patient's family history includes ADD / ADHD in her son; Bipolar disorder in her mother; Hypertension in her mother; Mental illness in her brother; Schizophrenia in her brother; Seizures in her father; Stickler syndrome in her sister.   Review of Systems: Please see the history of present illness.   All other systems are reviewed and negative.   Physical Exam: VS:  LMP 02/02/2013  .  BMI There is no height or weight on file to calculate BMI.  Wt Readings from Last 3 Encounters:  10/08/19 203 lb (92.1 kg)  06/25/19 210 lb (95.3 kg)  04/02/19 203 lb (92.1 kg)    General: Pleasant. Well developed, well nourished and in no acute distress.   HEENT: Normal.  Neck: Supple, no JVD, carotid bruits, or masses noted.  Cardiac: Regular rate and rhythm. No murmurs, rubs, or gallops. No edema.  Respiratory:  Lungs are clear to auscultation bilaterally with normal work of breathing.  GI: Soft and nontender.  MS: No deformity or atrophy.  Gait and ROM intact.  Skin: Warm and dry. Color is normal.  Neuro:  Strength and sensation are intact and no gross focal deficits noted.  Psych: Alert, appropriate and with normal affect.   LABORATORY DATA:  EKG:  EKG is ordered today.  Personally reviewed by me. This demonstrates ***.  Lab Results  Component Value Date   WBC 5.7 10/08/2019   HGB 14.4 10/08/2019   HCT 41.8 10/08/2019   PLT 244 10/08/2019   GLUCOSE 91 10/08/2019   CHOL 189 07/21/2015   TRIG 66 07/21/2015   HDL 65 07/21/2015   LDLCALC 111 (H) 07/21/2015    ALT 19 10/08/2019   AST 18 10/08/2019   NA 141 10/08/2019   K 4.2 10/08/2019   CL 106 10/08/2019   CREATININE 0.59 10/08/2019   BUN 12 10/08/2019   CO2 23 10/08/2019   TSH 0.869 10/08/2019   HGBA1C 5.6 10/08/2019     BNP (last 3 results) No results for input(s): BNP in the last 8760 hours.  ProBNP (last 3 results) No results for input(s): PROBNP in the last 8760 hours.   Other Studies Reviewed Today:  44 yo Diabetic F with low normal EF with no WMA. No LGE or WMA on 04/03/19 CMR. - can offer nitro PRN BP stable - will see Dr. Johnsie Cancel next week for virtual - if escalating pain or red flag symptoms, would need ED eval for unstable angina as appropriate - low threshold for CCTA +/- FFR  Assessment/Plan:  1. Chest pain  2. Low normal EF  3. History of tachycardia -    1. Tachycardia:  In setting of DM, likely some autonomic dysfunction Recent echo suggests EF mildly reduced now GLS markedly abnormal . I think she should have cardiac MRI to further investigate the myocardium. Given elevated HR despite beta blocker and decreased EF I think she should have a trial of Ivabradine. She has not had blood work or imaging to r/o Pheo  Will order plasma metanephrines   Current medicines are reviewed with the patient today.  The patient does not have concerns regarding medicines other than what has been noted above.  The following changes have been made:  See above.  Labs/ tests ordered today include:   No orders of the defined types were placed in this encounter.    Disposition:   FU with *** in {gen number [id number]} {Days to years:10300}.   Patient is agreeable to this plan and will call if any problems develop in the interim.   SignedTruitt Merle, NP  03/15/2020 7:59 AM  [facility] [place], [place]  [address] Phone: [phone] Fax: [phone]

## 2020-03-15 NOTE — Progress Notes (Signed)
CARDIOLOGY OFFICE NOTE  Date:  03/16/2020    Joy Contreras Date of Birth: 20-Nov-1975 Medical Record #[id number]  PCP:  Sid Falcon, MD  Cardiologist:  Johnsie Cancel    Chief Complaint  Patient presents with  . Chest Pain    Seen for Dr. Johnsie Cancel    History of Present Illness: Joy Contreras is a 44 y.o. female who presents today for a work in visit.   Seen for Dr. Johnsie Cancel. She has no known CAD - has had prior iron deficiency anemia and insomnia. Was referred here last September of 2020 for tachycardia. Had had prior Holter showing elevated HR over 100 with no nocturnal dip and PVCs - Echo with EF of 45 to 50%. She has been placed on Corlanor along with her Coreg.   Called last week with chest pain - initially given virtual visit - then changed to in office visit. Noted review by Dr. Blossom Hoops - "44 yo Diabetic F with low normal EF with no WMA.  No LGE or WMA on 04/03/19 CMR. - can offer nitro PRN BP stable  - will see Dr. Johnsie Cancel next week for virtual - if escalating pain or red flag symptoms, would need ED eval for unstable angina as appropriate  - low threshold for CCTA +/- FFR"  Thus added to my schedule for today. EKG last week without changes.   Comes in today. Here alone. She notes that for the past several weeks she has had a "weird sensation" - has had in her chest and in her left arm. Described as a dull sensation. Comes and goes - no real triggers - she is not active. It will last for several minutes - gets her attention. Stress may be a trigger at times. She has had some fast heart beating - she increased her Corlanor up to the full dose and had good response  But sometimes feels need to "cough". Not really short of breath - does endorse fatigue. Borderline DM.  Did not pick up the NTG that was called in. FH is positive for heart disease in her mother who is also a patient here. She does not smoke.   Past Medical History:  Diagnosis Date  . Depression     History of Depression  . Dysrhythmia    tachycardia  . GERD (gastroesophageal reflux disease)    Tx with PPI  . Goiter 2006   Dr Altheimer. Dx with Autonomously functioning goiter during pregnancy. anti-TPO negative.   . Menorrhagia    Being tx by GYN  . Tachycardia    Required BB during pregnancy. Baseline 100-130 at rest    Past Surgical History:  Procedure Laterality Date  . BILATERAL SALPINGECTOMY Bilateral 11/01/2014   Procedure: BILATERAL SALPINGECTOMY;  Surgeon: Servando Salina, MD;  Location: [facility];  Service: Gynecology;  Laterality: Bilateral;  . CESAREAN SECTION  11/22/2001  . LEFT OOPHORECTOMY  2002   Dr Garwin Brothers. 2/2 symptomatice fibroma  . LYSIS OF ADHESION N/A 11/01/2014   Procedure: LYSIS OF ADHESION;  Surgeon: Servando Salina, MD;  Location: [facility];  Service: Gynecology;  Laterality: N/A;  . 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 ROBOTIC ASSISTED SALPINGO OOPHERECTOMY Right 12/27/2016   Procedure: ROBOTIC ASSISTED RIGHT OOPHORECTOMY AND PERITONEAL BIOPSY;  Surgeon: Janie Morning, MD;  Location: [facility];  Service: Gynecology;  Laterality: Right;  . ROBOTIC ASSISTED TOTAL HYSTERECTOMY N/A 11/01/2014   Procedure: ROBOTIC ASSISTED TOTAL HYSTERECTOMY;  Surgeon: Servando Salina, MD;  Location: [facility];  Service: Gynecology;  Laterality: N/A;     Medications: Current Meds  Medication Sig  . amitriptyline (ELAVIL) 100 MG tablet Take by mouth.  . carvedilol (COREG) 12.5 MG tablet Take 1 tablet (12.5 mg total) by mouth 2 (two) times daily.  Marland Kitchen estradiol (CLIMARA - DOSED IN MG/24 HR) 0.1 mg/24hr patch Place 1 patch (0.1 mg total) onto the skin once a week.  . Insulin Pen Needle 32G X 4 MM MISC Use to inject liraglutide daily  . ivabradine (CORLANOR) 5 MG TABS tablet Take 1 tablet (5 mg total) by mouth 2 (two) times daily with a meal.  . Multiple Vitamins-Minerals (HAIR SKIN AND NAILS FORMULA) TABS Take 1 tablet by mouth 2 (two) times daily.  . nitroGLYCERIN  (NITROSTAT) 0.4 MG SL tablet Place 1 tablet (0.4 mg total) under the tongue every 5 (five) minutes as needed for chest pain.  . pantoprazole (PROTONIX) 40 MG tablet Take 1 tablet (40 mg total) by mouth as needed.  . sulfamethoxazole-trimethoprim (BACTRIM) 400-80 MG tablet Take one pill after sex  . [DISCONTINUED] ivabradine (CORLANOR) 5 MG TABS tablet Take 0.5 tablets (2.5 mg total) by mouth 2 (two) times daily with a meal.     Allergies: No Known Allergies  Social History: The patient  reports that she has never smoked. She has never used smokeless tobacco. She reports current alcohol use. She reports that she does not use drugs.   Family History: The patient's family history includes ADD / ADHD in her son; Bipolar disorder in her mother; Hypertension in her mother; Mental illness in her brother; Schizophrenia in her brother; Seizures in her father; Stickler syndrome in her sister.   Review of Systems: Please see the history of present illness.   All other systems are reviewed and negative.   Physical Exam: VS:  BP 110/76   Pulse 67   Ht 5' 3.25" (1.607 m)   Wt 202 lb (91.6 kg)   LMP 02/02/2013   SpO2 97%   BMI 35.50 kg/m  .  BMI Body mass index is 35.5 kg/m.  Wt Readings from Last 3 Encounters:  03/16/20 202 lb (91.6 kg)  10/08/19 203 lb (92.1 kg)  06/25/19 210 lb (95.3 kg)    General: Alert and in no acute distress. She is obese.    Cardiac: Regular rate and rhythm. No murmurs, rubs, or gallops. No edema.  Respiratory:  Lungs are clear to auscultation bilaterally with normal work of breathing.  GI: Soft and nontender.  MS: No deformity or atrophy. Gait and ROM intact.  Skin: Warm and dry. Color is normal.  Neuro:  Strength and sensation are intact and no gross focal deficits noted.  Psych: Alert, appropriate and with normal affect.   LABORATORY DATA:  EKG:  EKG is not ordered today. EKG last week without changes. NSR.   Lab Results  Component Value Date   WBC  5.7 10/08/2019   HGB 14.4 10/08/2019   HCT 41.8 10/08/2019   PLT 244 10/08/2019   GLUCOSE 91 10/08/2019   CHOL 189 07/21/2015   TRIG 66 07/21/2015   HDL 65 07/21/2015   LDLCALC 111 (H) 07/21/2015   ALT 19 10/08/2019   AST 18 10/08/2019   NA 141 10/08/2019   K 4.2 10/08/2019   CL 106 10/08/2019   CREATININE 0.59 10/08/2019   BUN 12 10/08/2019   CO2 23 10/08/2019   TSH 0.869 10/08/2019   HGBA1C 5.6 10/08/2019  BNP (last 3 results) No results for input(s): BNP in the last 8760 hours.  ProBNP (last 3 results) No results for input(s): PROBNP in the last 8760 hours.   Other Studies Reviewed Today:  CARDIAC MRI 03/2019 IMPRESSION: 1. Normal chamber sizes  2.  Normal RV size and function  3. Low normal EF 53% with normal septal thickness 10 mm and no RWMA;s  4.  No delayed gadolinium uptake  5.  Mild appearing MR  6.  No effusion  7.  Normal aortic root 2.8 cm  Jenkins Rouge    Assessment/Plan:  1. Chest pain - left arm pain - has borderline DM, + FH and is obese. Will arrange for Coronary CT. Has had prior hysterectomy. HR is 67 today.   2. History of tachycardia - on Corlanor - cardiac MRi was ok - normal EF - normal plasma metanephrines. She has increased her dose to the full amount recently. Remains on Coreg as well. Corlanor is refilled for her.   3. Obesity  4. Borderline DM  Current medicines are reviewed with the patient today.  The patient does not have concerns regarding medicines other than what has been noted above.  The following changes have been made:  See above.  Labs/ tests ordered today include:    Orders Placed This Encounter  Procedures  . CT CORONARY MORPH W/CTA COR W/SCORE W/CA W/CM &/OR WO/CM  . CT CORONARY FRACTIONAL FLOW RESERVE DATA PREP  . CT CORONARY FRACTIONAL FLOW RESERVE FLUID ANALYSIS     Disposition:   Further disposition pending results.   Patient is agreeable to this plan and will call if any  problems develop in the interim.   SignedTruitt Merle, NP  03/16/2020 8:42 AM  [facility] [address] [place], [place]  [address] Phone: [phone] Fax: [phone]

## 2020-03-16 ENCOUNTER — Other Ambulatory Visit: Payer: Self-pay

## 2020-03-16 ENCOUNTER — Other Ambulatory Visit: Payer: Self-pay | Admitting: Nurse Practitioner

## 2020-03-16 ENCOUNTER — Ambulatory Visit (INDEPENDENT_AMBULATORY_CARE_PROVIDER_SITE_OTHER): Payer: 59 | Admitting: Nurse Practitioner

## 2020-03-16 ENCOUNTER — Encounter: Payer: Self-pay | Admitting: Nurse Practitioner

## 2020-03-16 ENCOUNTER — Ambulatory Visit: Payer: 59 | Admitting: Nurse Practitioner

## 2020-03-16 VITALS — BP 110/76 | HR 67 | Ht 63.25 in | Wt 202.0 lb

## 2020-03-16 DIAGNOSIS — M79602 Pain in left arm: Secondary | ICD-10-CM

## 2020-03-16 DIAGNOSIS — R079 Chest pain, unspecified: Secondary | ICD-10-CM

## 2020-03-16 DIAGNOSIS — R7303 Prediabetes: Secondary | ICD-10-CM | POA: Diagnosis not present

## 2020-03-16 DIAGNOSIS — I259 Chronic ischemic heart disease, unspecified: Secondary | ICD-10-CM

## 2020-03-16 MED ORDER — IVABRADINE HCL 5 MG PO TABS: 5.0000 mg | ORAL_TABLET | Freq: Two times a day (BID) | ORAL | 3 refills | Status: DC

## 2020-03-16 MED FILL — CORLANOR 5 MG TABLET: 5 | 90 days supply | Qty: 180 | Fill #0

## 2020-03-16 NOTE — Patient Instructions (Addendum)
After Visit Summary:  We will be checking the following labs today - None   Medication Instructions:    Continue with your current medicines.   I did refill the Corlanor   If you need a refill on your cardiac medications before your next appointment, please call your pharmacy.     Testing/Procedures To Be Arranged:  Cardiac CT  Follow-Up:   Will see how your study turns out    At [facility], you and your health needs are our priority.  As part of our continuing mission to provide you with exceptional heart care, we have created designated Provider Care Teams.  These Care Teams include your primary Cardiologist (physician) and Advanced Practice Providers (APPs -  Physician Assistants and Nurse Practitioners) who all work together to provide you with the care you need, when you need it.  Special Instructions:  . Stay safe, wash your hands for at least 20 seconds and wear a mask when needed.  . It was good to talk with you today.   Your cardiac CT will be scheduled at:   [facility] [address] [place], [place] [address] [phone]   If scheduled at [facility], please arrive at the [facility] main entrance of [facility] 30 minutes prior to test start time. Proceed to the [facility] Radiology Department (first floor) to check-in and test prep.   Please follow these instructions carefully (unless otherwise directed):   On the Night Before the Test: . Be sure to Drink plenty of water. . Do not consume any caffeinated/decaffeinated beverages or chocolate 12 hours prior to your test. . Do not take any antihistamines 12 hours prior to your test. .   On the Day of the Test: . Drink plenty of water. Do not drink any water within one hour of the test. . Do not eat any food 4 hours prior to the test. . You may take your regular medications prior to the test.  . Take your morning dose of Corlanor 2 hours prior to the test please  along with your Coreg.  Marland Kitchen FEMALES- please wear underwire-free bra if available       After the Test: . Drink plenty of water. . After receiving IV contrast, you may experience a mild flushed feeling. This is normal. . On occasion, you may experience a mild rash up to 24 hours after the test. This is not dangerous. If this occurs, you can take Benadryl 25 mg and increase your fluid intake. . If you experience trouble breathing, this can be serious. If it is severe call 911 IMMEDIATELY. If it is mild, please call our office.    Once we have confirmed authorization from your insurance company, we will call you to set up a date and time for your test. Based on how quickly your insurance processes prior authorizations requests, please allow up to 4 weeks to be contacted for scheduling your Cardiac CT appointment. Be advised that routine Cardiac CT appointments could be scheduled as many as 8 weeks after your provider has ordered it.  For non-scheduling related questions, please contact the cardiac imaging nurse navigator should you have any questions/concerns: Marchia Bond, Cardiac Imaging Nurse Navigator Burley Saver, Interim Cardiac Imaging Nurse [facility] Direct Office Dial: [phone]   For scheduling needs, including cancellations and rescheduling, please call Vivien Rota at [phone], option 3.      Call the [place] office  at [phone] if you have any questions, problems or concerns.

## 2020-03-17 NOTE — Telephone Encounter (Signed)
Error

## 2020-03-19 DIAGNOSIS — H5213 Myopia, bilateral: Secondary | ICD-10-CM | POA: Diagnosis not present

## 2020-04-04 ENCOUNTER — Telehealth (HOSPITAL_COMMUNITY): Payer: Self-pay | Admitting: Emergency Medicine

## 2020-04-04 NOTE — Telephone Encounter (Signed)
Attempted to call patient regarding upcoming cardiac CT appointment. °Left message on voicemail with name and callback number °Samani Deal RN Navigator Cardiac Imaging ° [facility] Services °[phone] Office °[phone] Cell ° °

## 2020-04-05 ENCOUNTER — Telehealth (HOSPITAL_COMMUNITY): Payer: Self-pay | Admitting: Emergency Medicine

## 2020-04-05 NOTE — Telephone Encounter (Signed)
Attempted to call patient regarding upcoming cardiac CT appointment. °Left message on voicemail with name and callback number °Aquilla Voiles RN Navigator Cardiac Imaging °[facility] °[phone] Office °[phone] Cell ° °

## 2020-04-05 NOTE — Telephone Encounter (Signed)
Reaching out to patient to offer assistance regarding upcoming cardiac imaging study; pt verbalizes understanding of appt date/time, parking situation and where to check in, pre-test NPO status and medications ordered, and verified current allergies; name and call back number provided for further questions should they arise Marchia Bond RN Navigator Cardiac Imaging [facility] [phone] office [phone] cell  Pt to take corlanor and coreg 2 hr prior to scan

## 2020-04-06 ENCOUNTER — Other Ambulatory Visit: Payer: Self-pay

## 2020-04-06 ENCOUNTER — Ambulatory Visit (HOSPITAL_COMMUNITY)
Admission: RE | Admit: 2020-04-06 | Discharge: 2020-04-06 | Disposition: A | Discharge status: Home / Self Care | Payer: 59 | Source: Ambulatory Visit | Attending: Nurse Practitioner | Admitting: Nurse Practitioner

## 2020-04-06 DIAGNOSIS — I259 Chronic ischemic heart disease, unspecified: Secondary | ICD-10-CM | POA: Insufficient documentation

## 2020-04-06 IMAGING — CT CT HEART MORP W/ CTA COR W/ SCORE W/ CA W/CM &/OR W/O CM
4 of 7 series · 8 of 20 positions shown, 9 images · non-contrast
Comparison: None.
COMPARISON: None.

Addendum:
EXAM:
OVER-READ INTERPRETATION  CT CHEST

The following report is an over-read performed by radiologist Dr.
SIAU LONG [REDACTED] on [DATE]. This
over-read does not include interpretation of cardiac or coronary
anatomy or pathology. The coronary CTA interpretation by the
cardiologist is attached.
CLINICAL DATA: 43 Year old African American Female
Cardiac/Coronary  CTA
TECHNIQUE: The patient was scanned on a Phillips Force scanner.

[Series 6: best diast 75 % · axial · 0.39mm/px · z∈[+1280,+1321]mm · 2 of 306 slices shown, 3 images]
[im 102/306  vessel]
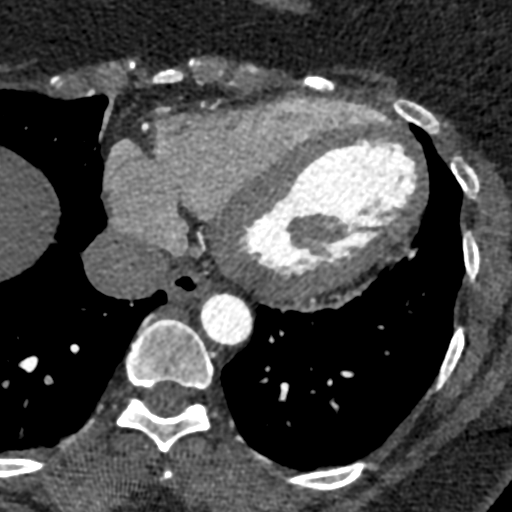
[im 102/306  lung]
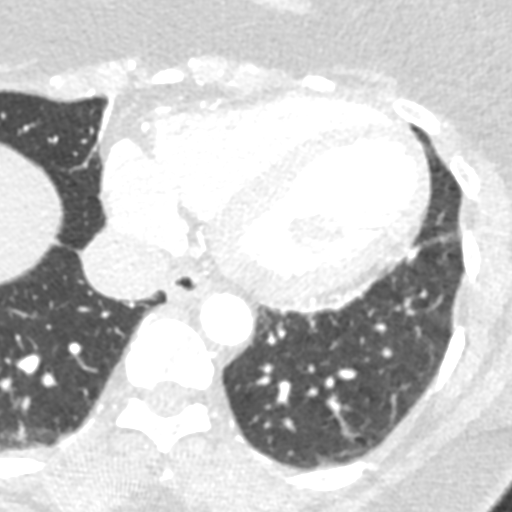
[im 204/306  vessel]
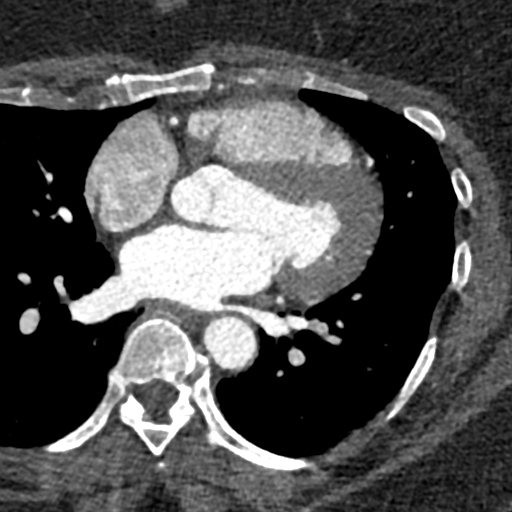

[Series 7: best syst 42 % · axial · 0.39mm/px · z∈[+1280,+1321]mm · 2 of 306 slices shown]
[im 102/306  vessel]
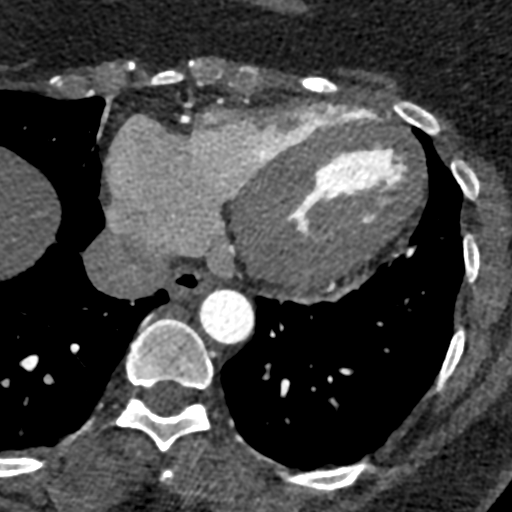
[im 204/306  vessel]
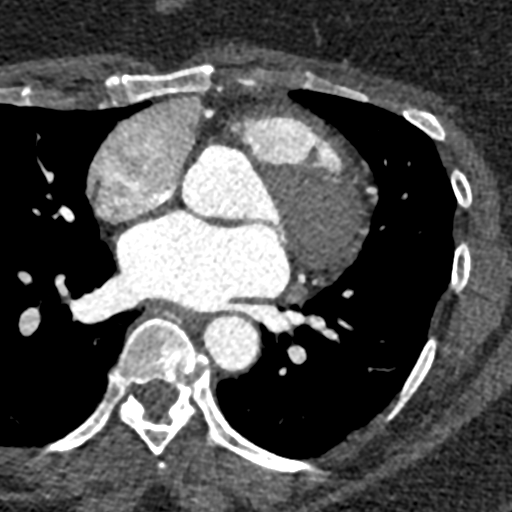

[Series 8: ts diast sharp 75 % · axial · 0.39mm/px · z∈[+1280,+1321]mm · 2 of 306 slices shown]
[im 102/306  lung]
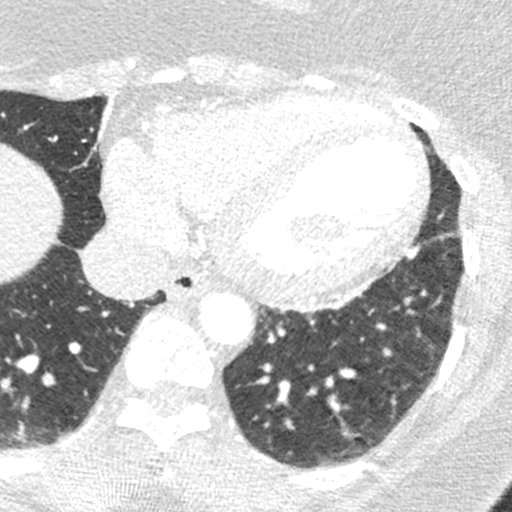
[im 204/306  lung]
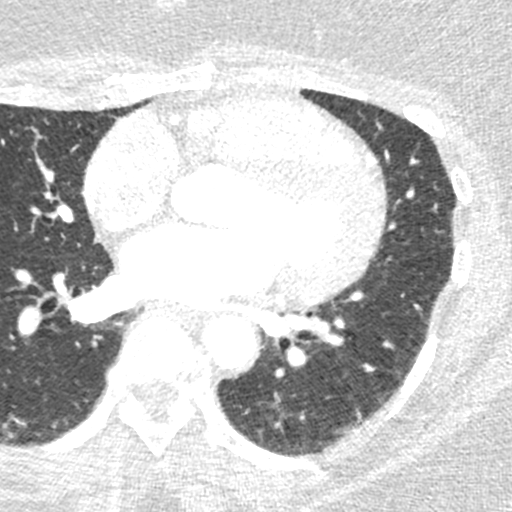

[Series 9: ts syst sharp 42 % · axial · 0.39mm/px · z∈[+1280,+1321]mm · 2 of 306 slices shown]
[im 102/306  lung]
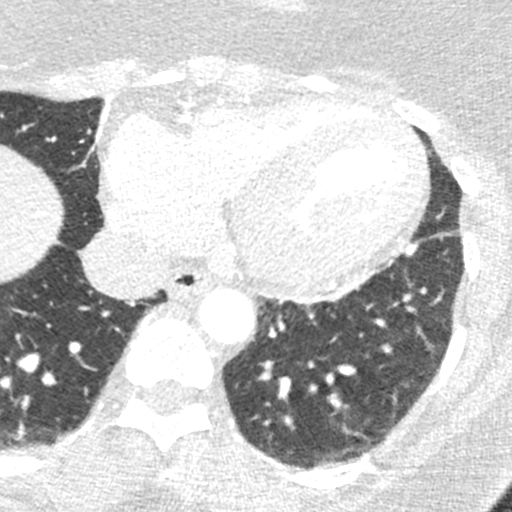
[im 204/306  lung]
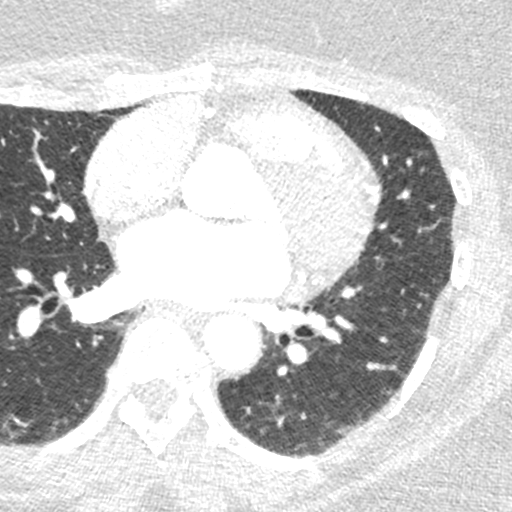

[8 of 20 positions shown; findings below may reference images not displayed]

FINDINGS: Vascular: Heart is normal size.  Aorta normal caliber.

Mediastinum/Nodes: No adenopathy.

Lungs/Pleura: Areas of scarring or atelectasis in the lung bases. No
effusions.

Upper Abdomen: Imaging into the upper abdomen demonstrates no acute
findings.

Musculoskeletal: Chest wall soft tissues are unremarkable. No acute
bony abnormality.
IMPRESSION: Bibasilar scarring or atelectasis.

No acute extra cardiac abnormality
FINDINGS: A 100 kV prospective scan was triggered in the descending thoracic
aorta at 111 HU's. Axial non-contrast 3 mm slices were carried out
through the heart. The data set was analyzed on a dedicated work
station and scored using the Agatson method. Gantry rotation speed
was 250 msecs and collimation was .6 mm. No beta blockade and 0.8 mg
of sl NTG was given. The 3D data set was reconstructed in 5%
intervals of the 67-82 % of the R-R cycle. Diastolic phases were
analyzed on a dedicated work station using MPR, MIP and VRT modes.
The patient received 80 cc of contrast.

Aorta:  Normal size.  No calcifications.  No dissection.

Aortic Valve:  Tri-leaflet.  No calcifications.

Coronary Arteries:  Normal coronary origin.  Right dominance.

Coronary calcium score of 0. This was 1st percentile for age, sex,
and race matched control.

RCA is a large dominant artery that gives rise to PDA and PLA. There
is a mild, non-obstructive (25-49%) soft plaque in the distal
vessel.

Left main is a large artery that gives rise to LAD and LCX arteries.

LAD is a large vessel that gives rise to one large D1 branch. There
a minimal, non-obstructive (1-24%) narrowing in the mid vessel.

LCX is a non-dominant artery that gives rise to one large OM1
branch. There is no plaque.

Other findings:

Normal pulmonary vein drainage into the left atrium.

Normal left atrial appendage without a thrombus.

Normal size of the pulmonary artery.

Extra-cardiac findings: See attached radiology report for
non-cardiac structures.
IMPRESSION: 1. Coronary calcium score of 0. This was 1st percentile for age,
sex, and race matched control.

2. Normal coronary origin.  Right dominance.

3. CAD-RADS 2. Mild non-obstructive CAD (25-49%). Consider
non-atherosclerotic causes of chest pain. Consider preventive
therapy and risk factor modification.

*** End of Addendum ***
EXAM:
OVER-READ INTERPRETATION  CT CHEST

The following report is an over-read performed by radiologist Dr.
SIAU LONG [REDACTED] on [DATE]. This
over-read does not include interpretation of cardiac or coronary
anatomy or pathology. The coronary CTA interpretation by the
cardiologist is attached.
FINDINGS: Vascular: Heart is normal size.  Aorta normal caliber.

Mediastinum/Nodes: No adenopathy.

Lungs/Pleura: Areas of scarring or atelectasis in the lung bases. No
effusions.

Upper Abdomen: Imaging into the upper abdomen demonstrates no acute
findings.

Musculoskeletal: Chest wall soft tissues are unremarkable. No acute
bony abnormality.
IMPRESSION: Bibasilar scarring or atelectasis.

No acute extra cardiac abnormality

## 2020-04-06 MED ORDER — NITROGLYCERIN 0.4 MG SL SUBL
SUBLINGUAL_TABLET | SUBLINGUAL | Status: AC
  Administered 2020-04-06: 0.8 mg via SUBLINGUAL
  Filled 2020-04-06: qty 2

## 2020-04-06 MED ORDER — IOHEXOL 350 MG/ML SOLN
80.0000 mL | Freq: Once | INTRAVENOUS | Status: AC | PRN
  Administered 2020-04-06: 80 mL via INTRAVENOUS

## 2020-04-06 MED ORDER — METOPROLOL TARTRATE 5 MG/5ML IV SOLN
INTRAVENOUS | Status: AC
  Filled 2020-04-06: qty 10

## 2020-04-06 MED ORDER — METOPROLOL TARTRATE 5 MG/5ML IV SOLN
5.0000 mg | INTRAVENOUS | Status: DC | PRN
  Administered 2020-04-06: 5 mg via INTRAVENOUS

## 2020-04-06 MED ORDER — NITROGLYCERIN 0.4 MG SL SUBL: 0.8000 mg | SUBLINGUAL_TABLET | SUBLINGUAL | Status: DC | PRN

## 2020-04-22 ENCOUNTER — Other Ambulatory Visit: Payer: Self-pay | Admitting: Internal Medicine

## 2020-04-22 ENCOUNTER — Other Ambulatory Visit: Payer: Self-pay

## 2020-04-22 ENCOUNTER — Ambulatory Visit (INDEPENDENT_AMBULATORY_CARE_PROVIDER_SITE_OTHER): Payer: 59 | Admitting: Internal Medicine

## 2020-04-22 ENCOUNTER — Encounter: Payer: Self-pay | Admitting: Internal Medicine

## 2020-04-22 VITALS — BP 137/85 | HR 81 | Temp 98.1°F | Ht 64.0 in | Wt 202.6 lb

## 2020-04-22 DIAGNOSIS — Z0000 Encounter for general adult medical examination without abnormal findings: Secondary | ICD-10-CM

## 2020-04-22 DIAGNOSIS — R000 Tachycardia, unspecified: Secondary | ICD-10-CM

## 2020-04-22 DIAGNOSIS — M899 Disorder of bone, unspecified: Secondary | ICD-10-CM | POA: Diagnosis not present

## 2020-04-22 DIAGNOSIS — E049 Nontoxic goiter, unspecified: Secondary | ICD-10-CM | POA: Diagnosis not present

## 2020-04-22 DIAGNOSIS — K219 Gastro-esophageal reflux disease without esophagitis: Secondary | ICD-10-CM | POA: Diagnosis not present

## 2020-04-22 DIAGNOSIS — D509 Iron deficiency anemia, unspecified: Secondary | ICD-10-CM

## 2020-04-22 DIAGNOSIS — R030 Elevated blood-pressure reading, without diagnosis of hypertension: Secondary | ICD-10-CM | POA: Insufficient documentation

## 2020-04-22 DIAGNOSIS — G4700 Insomnia, unspecified: Secondary | ICD-10-CM | POA: Diagnosis not present

## 2020-04-22 DIAGNOSIS — E559 Vitamin D deficiency, unspecified: Secondary | ICD-10-CM | POA: Diagnosis not present

## 2020-04-22 DIAGNOSIS — R0789 Other chest pain: Secondary | ICD-10-CM

## 2020-04-22 DIAGNOSIS — Z90722 Acquired absence of ovaries, bilateral: Secondary | ICD-10-CM

## 2020-04-22 DIAGNOSIS — Z90710 Acquired absence of both cervix and uterus: Secondary | ICD-10-CM

## 2020-04-22 DIAGNOSIS — R5382 Chronic fatigue, unspecified: Secondary | ICD-10-CM | POA: Diagnosis not present

## 2020-04-22 DIAGNOSIS — Z9079 Acquired absence of other genital organ(s): Secondary | ICD-10-CM

## 2020-04-22 DIAGNOSIS — M25552 Pain in left hip: Secondary | ICD-10-CM

## 2020-04-22 MED ORDER — VITAMIN D (ERGOCALCIFEROL) 1.25 MG (50000 UNIT) PO CAPS: 50000.0000 [IU] | ORAL_CAPSULE | ORAL | 0 refills | Status: DC

## 2020-04-22 MED FILL — VIT D2 1.25 MG (50,000 UNIT: 1.25 MG | 56 days supply | Qty: 8 | Fill #0

## 2020-04-22 NOTE — Assessment & Plan Note (Signed)
Vitamin D rechecked at last visit.  She was noted to have a Vitamin D of 19.2.  She has taken high dose ergocalciferol in the past and is amenable to taking this again.   Ergocalciferol 50,000 units once weekly for 8 weeks.   Recheck Vitamin D in 3 months.

## 2020-04-22 NOTE — Assessment & Plan Note (Signed)
She notes no change to neck size or trouble swallowing.  TFTs done this year were WNL.  Continue to monitor.

## 2020-04-22 NOTE — Assessment & Plan Note (Signed)
This is chronic and controlled.  She takes her PPI intermittently.  Usually she will get symptoms and will take it for about 1 week and then the symptoms will resolve.  She will also occasionally wake up with a cough and will start taking this medication again for a week and it will ease off.    Plan Continue taking PPI intermittently.

## 2020-04-22 NOTE — Assessment & Plan Note (Signed)
She is up to date on immunizations and mammogram.   We discussed starting colon cancer screening at 44 years old.

## 2020-04-22 NOTE — Assessment & Plan Note (Signed)
Her CBC and other labs are normal at last check.  Her H/H are within normal limits.  She continues to have fatigue for which we will check a sleep study.    Plan Continue to monitor.

## 2020-04-22 NOTE — Assessment & Plan Note (Addendum)
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  Her hip pain is improved.  Recommendations was to get a CT of the pelvis followed by a one year follow up of the MRI for stability.  She is amenable to these, but would rather wait to get the CT in the new year.  Schedule at next visit.  Check Bone specific ALP.

## 2020-04-22 NOTE — Assessment & Plan Note (Signed)
Today Joy Contreras has an elevated BP.  She notes that she has been checking at work and her BP has been creeping up.  She is on Coreg for tachycardia which she takes regularly.  She has recently had a CT scan of the coronaries which had a calcium score of 0.  She notes that she has recently been craving salt and feels that this may be contributing.  We discussed trying to limit salt in the diet and the Dash diet.  She has many family members with HTN and she may have a version of salt sensitivity which would be amenable to this dietary change.   Plan Trial of DASH diet Will see again in 3 months, if no improvement, will plan to increase Coreg to 25mg  BID.

## 2020-04-22 NOTE — Assessment & Plan Note (Signed)
This is a chronic issue for her, currently controlled on coreg 12.5mg  BID and ivabradine 5mg  BID.  She follows with Dr. Johnsie Cancel.  She will continue to take her medications and follow up with Dr. Johnsie Cancel.

## 2020-04-22 NOTE — Assessment & Plan Note (Signed)
This is improved.  She is not sure why, but seems to be better today.  She is not able to go to PT given her work schedule.    Plan Continue intermittent aleve Continue to monitor.

## 2020-04-22 NOTE — Assessment & Plan Note (Signed)
Her last surgery was related to a cyst on her ovary.  Given she has a bilateral oophorectomy now she is on therapy with estradiol.  If she does not wear the patch, she will have immediate hot flashes.  She follows with Gynecology, Dr. Garwin Brothers.  Based on current guidelines, when undergoing bilateral oophorectomy for benign reasons (bilateral cysts, fibroids) it is recommended that patients continue estrogen therapy until 45 years old.    Plan Continue estradiol patch until 60 years unless guidelines change or new information is available

## 2020-04-22 NOTE — Assessment & Plan Note (Signed)
This is improved.  Coronary CT scan done this year showed calcium score of 0.  Continue follow up with Dr. Johnsie Cancel and continue to monitor.

## 2020-04-22 NOTE — Progress Notes (Signed)
Subjective:    Patient ID: Joy Contreras, female    DOB: 1975-11-14, 44 y.o.   MRN: [id number]  CC: follow up of left hip pain, high blood pressure.   HPI  Joy Contreras is a 44 year old woman with PMH of GERD, tachycardia (sees Dr. Johnsie Cancel), vitamin D and iron deficiency, insomnia, fatigue, non cardiac chest pain and bone lesion on hip who presents for follow up.   Today, Joy Contreras reports that she is doing well.  Her hip pain is better.  She has been informed about her MRI findings and ALP levels.  We will check bone specific ALP today.  She was recently seen for chest pain related to stress.  A coronary CT scan was done showing a Ca score of 0. This has seemed to improve as well.  She has reflux for which she takes protonix intermittently.  She continues to have a low vitamin D and we will give her ergocalciferol today.   We discussed her sleep, fatigue and insomnia.  She has tried amitriptyline and trazodone without relief.  She notes that she falls asleep around midnight or 1am, awakens around 330am every night and then is up by 6am.  She feels that she would be good if she could get an average of 7 hours a night.  She notes that her husband states that she snores and he feels that this is what wakes her up in the middle of the night.  She does not remember her dreams.   She further notes post-coitus UTI type symptoms, which sometimes become more bothersome, about 30X a year.  She has bactrim which she takes after sex which helps.    Review of Systems  Constitutional: Positive for fatigue. Negative for activity change and appetite change.  Respiratory: Negative for chest tightness and shortness of breath.        Snoring, awakening at night  Cardiovascular: Positive for chest pain (improved). Negative for palpitations.  Gastrointestinal:       Reflux, chronic cough related  Genitourinary: Negative for dysuria and vaginal pain.  Musculoskeletal: Positive for arthralgias (hip pain,  but improved). Negative for gait problem.  Neurological: Negative for weakness.  Psychiatric/Behavioral: Positive for sleep disturbance. Negative for decreased concentration.       Objective:   Physical Exam Vitals and nursing note reviewed.  Constitutional:      General: She is not in acute distress.    Appearance: Normal appearance. She is not toxic-appearing.  HENT:     Head: Normocephalic and atraumatic.  Eyes:     General: No scleral icterus.       Right eye: No discharge.        Left eye: No discharge.  Cardiovascular:     Rate and Rhythm: Normal rate and regular rhythm.     Heart sounds: No murmur heard.   Pulmonary:     Effort: Pulmonary effort is normal. No respiratory distress.     Breath sounds: Normal breath sounds. No wheezing.  Abdominal:     General: Abdomen is flat. Bowel sounds are normal.     Palpations: Abdomen is soft.  Skin:    General: Skin is warm and dry.  Neurological:     General: No focal deficit present.     Mental Status: She is alert and oriented to person, place, and time.  Psychiatric:        Mood and Affect: Mood normal.        Behavior: Behavior  normal.    Will check a bone specific ALP today.        Assessment & Plan:  Return in 3 months.

## 2020-04-22 NOTE — Assessment & Plan Note (Signed)
See insomnia issue.  We will plan for a sleep study to further work up.  Treat Vitamin D deficiency.

## 2020-04-22 NOTE — Patient Instructions (Signed)
Ulis Rias - -  Thank you so much for coming to see me!  For your low vitamin D, please take 50,000 units of vitamin D once a day for 8 weeks and we will recheck.   For the snoring and insomnia, let's check a sleep study!  This may help with your weight, blood pressure and insomnia.   For your blood pressure, try the dash diet for 3 months and we will reassess.  (see below).   Thank you!!   DASH Eating Plan DASH stands for "Dietary Approaches to Stop Hypertension." The DASH eating plan is a healthy eating plan that has been shown to reduce high blood pressure (hypertension). It may also reduce your risk for type 2 diabetes, heart disease, and stroke. The DASH eating plan may also help with weight loss. What are tips for following this plan?  General guidelines  Avoid eating more than 2,300 mg (milligrams) of salt (sodium) a day. If you have hypertension, you may need to reduce your sodium intake to 1,500 mg a day.  Limit alcohol intake to no more than 1 drink a day for nonpregnant women and 2 drinks a day for men. One drink equals 12 oz of beer, 5 oz of wine, or 1 oz of hard liquor.  Work with your health care provider to maintain a healthy body weight or to lose weight. Ask what an ideal weight is for you.  Get at least 30 minutes of exercise that causes your heart to beat faster (aerobic exercise) most days of the week. Activities may include walking, swimming, or biking.  Work with your health care provider or diet and nutrition specialist (dietitian) to adjust your eating plan to your individual calorie needs. Reading food labels   Check food labels for the amount of sodium per serving. Choose foods with less than 5 percent of the Daily Value of sodium. Generally, foods with less than 300 mg of sodium per serving fit into this eating plan.  To find whole grains, look for the word "whole" as the first word in the ingredient list. Shopping  Buy products labeled as "low-sodium" or "no  salt added."  Buy fresh foods. Avoid canned foods and premade or frozen meals. Cooking  Avoid adding salt when cooking. Use salt-free seasonings or herbs instead of table salt or sea salt. Check with your health care provider or pharmacist before using salt substitutes.  Do not fry foods. Cook foods using healthy methods such as baking, boiling, grilling, and broiling instead.  Cook with heart-healthy oils, such as olive, canola, soybean, or sunflower oil. Meal planning  Eat a balanced diet that includes: ? 5 or more servings of fruits and vegetables each day. At each meal, try to fill half of your plate with fruits and vegetables. ? Up to 6-8 servings of whole grains each day. ? Less than 6 oz of lean meat, poultry, or fish each day. A 3-oz serving of meat is about the same size as a deck of cards. One egg equals 1 oz. ? 2 servings of low-fat dairy each day. ? A serving of nuts, seeds, or beans 5 times each week. ? Heart-healthy fats. Healthy fats called Omega-3 fatty acids are found in foods such as flaxseeds and coldwater fish, like sardines, salmon, and mackerel.  Limit how much you eat of the following: ? Canned or prepackaged foods. ? Food that is high in trans fat, such as fried foods. ? Food that is high in saturated fat, such as  fatty meat. ? Sweets, desserts, sugary drinks, and other foods with added sugar. ? Full-fat dairy products.  Do not salt foods before eating.  Try to eat at least 2 vegetarian meals each week.  Eat more home-cooked food and less restaurant, buffet, and fast food.  When eating at a restaurant, ask that your food be prepared with less salt or no salt, if possible. What foods are recommended? The items listed may not be a complete list. Talk with your dietitian about what dietary choices are best for you. Grains Whole-grain or whole-wheat bread. Whole-grain or whole-wheat pasta. Brown rice. [place]row. Bulgur. Whole-grain and low-sodium  cereals. Pita bread. Low-fat, low-sodium crackers. Whole-wheat flour tortillas. Vegetables Fresh or frozen vegetables (raw, steamed, roasted, or grilled). Low-sodium or reduced-sodium tomato and vegetable juice. Low-sodium or reduced-sodium tomato sauce and tomato paste. Low-sodium or reduced-sodium canned vegetables. Fruits All fresh, dried, or frozen fruit. Canned fruit in natural juice (without added sugar). Meat and other protein foods Skinless chicken or [place]. Ground chicken or [place]. Pork with fat trimmed off. Fish and seafood. Egg whites. Dried beans, peas, or lentils. Unsalted nuts, nut butters, and seeds. Unsalted canned beans. Lean cuts of beef with fat trimmed off. Low-sodium, lean deli meat. Dairy Low-fat (1%) or fat-free (skim) milk. Fat-free, low-fat, or reduced-fat cheeses. Nonfat, low-sodium ricotta or cottage cheese. Low-fat or nonfat yogurt. Low-fat, low-sodium cheese. Fats and oils Soft margarine without trans fats. Vegetable oil. Low-fat, reduced-fat, or light mayonnaise and salad dressings (reduced-sodium). Canola, safflower, olive, soybean, and sunflower oils. Avocado. Seasoning and other foods Herbs. Spices. Seasoning mixes without salt. Unsalted popcorn and pretzels. Fat-free sweets. What foods are not recommended? The items listed may not be a complete list. Talk with your dietitian about what dietary choices are best for you. Grains Baked goods made with fat, such as croissants, muffins, or some breads. Dry pasta or rice meal packs. Vegetables Creamed or fried vegetables. Vegetables in a cheese sauce. Regular canned vegetables (not low-sodium or reduced-sodium). Regular canned tomato sauce and paste (not low-sodium or reduced-sodium). Regular tomato and vegetable juice (not low-sodium or reduced-sodium). Angie Fava. Olives. Fruits Canned fruit in a light or heavy syrup. Fried fruit. Fruit in cream or butter sauce. Meat and other protein foods Fatty cuts of meat. Ribs.  Fried meat. Berniece Salines. Sausage. Bologna and other processed lunch meats. Salami. Fatback. Hotdogs. Bratwurst. Salted nuts and seeds. Canned beans with added salt. Canned or smoked fish. Whole eggs or egg yolks. Chicken or [place] with skin. Dairy Whole or 2% milk, cream, and half-and-half. Whole or full-fat cream cheese. Whole-fat or sweetened yogurt. Full-fat cheese. Nondairy creamers. Whipped toppings. Processed cheese and cheese spreads. Fats and oils Butter. Stick margarine. Lard. Shortening. Ghee. Bacon fat. Tropical oils, such as coconut, palm kernel, or palm oil. Seasoning and other foods Salted popcorn and pretzels. Onion salt, garlic salt, seasoned salt, table salt, and sea salt. Worcestershire sauce. Tartar sauce. Barbecue sauce. Teriyaki sauce. Soy sauce, including reduced-sodium. Steak sauce. Canned and packaged gravies. Fish sauce. Oyster sauce. Cocktail sauce. Horseradish that you find on the shelf. Ketchup. Mustard. Meat flavorings and tenderizers. Bouillon cubes. Hot sauce and Tabasco sauce. Premade or packaged marinades. Premade or packaged taco seasonings. Relishes. Regular salad dressings. Where to find more information:  National Heart, Lung, and [place]: https://wilson-eaton.com/  American Heart Association: www.heart.org Summary  The DASH eating plan is a healthy eating plan that has been shown to reduce high blood pressure (hypertension). It may also reduce your risk for  type 2 diabetes, heart disease, and stroke.  With the DASH eating plan, you should limit salt (sodium) intake to 2,300 mg a day. If you have hypertension, you may need to reduce your sodium intake to 1,500 mg a day.  When on the DASH eating plan, aim to eat more fresh fruits and vegetables, whole grains, lean proteins, low-fat dairy, and heart-healthy fats.  Work with your health care provider or diet and nutrition specialist (dietitian) to adjust your eating plan to your individual calorie needs. This  information is not intended to replace advice given to you by your health care provider. Make sure you discuss any questions you have with your health care provider. Document Revised: 04/12/2017 Document Reviewed: 04/23/2016 Elsevier Patient Education  2020 [employer].

## 2020-04-22 NOTE — Assessment & Plan Note (Signed)
This is chronic and uncontrolled.  We discussed medications and she has tried amitriptyline and trazodone without improvement.  She is chronically waking up overnight and she reports that her husband notes profound snoring.  She is unable to fall asleep easily.    Plan Check a sleep study.  Given obesity, elevated blood pressures and insomnia, she could have sleep apnea which would be amenable to treatment.

## 2020-04-26 LAB — ALKALINE PHOSPHATASE, BONE SPECIFIC: Tandem-R Ostase: 23.1 ug/L

## 2020-04-27 ENCOUNTER — Encounter: Payer: Self-pay | Admitting: Internal Medicine

## 2020-05-02 ENCOUNTER — Encounter: Payer: Self-pay | Admitting: *Deleted

## 2020-05-16 ENCOUNTER — Other Ambulatory Visit: Payer: 59

## 2020-05-16 ENCOUNTER — Encounter: Payer: Self-pay | Admitting: *Deleted

## 2020-05-16 ENCOUNTER — Other Ambulatory Visit: Payer: Self-pay

## 2020-05-16 ENCOUNTER — Other Ambulatory Visit: Payer: Self-pay | Admitting: Internal Medicine

## 2020-05-16 DIAGNOSIS — Z20822 Contact with and (suspected) exposure to covid-19: Secondary | ICD-10-CM | POA: Insufficient documentation

## 2020-05-16 NOTE — Addendum Note (Signed)
Addended by: Bufford Spikes on: 05/16/2020 12:01 PM   Modules accepted: Orders

## 2020-05-16 NOTE — Progress Notes (Signed)
Patient instructed to get tested for COVID-19 due to recent close exposure at home. Son is positive and mildly symptomatic. Health at work approved in clinic testing. Orders have been placed.

## 2020-05-17 LAB — SARS-COV-2, NAA 2 DAY TAT

## 2020-05-17 LAB — NOVEL CORONAVIRUS, NAA: SARS-CoV-2, NAA: NOT DETECTED

## 2020-06-06 ENCOUNTER — Institutional Professional Consult (permissible substitution): Payer: 59 | Admitting: Neurology

## 2020-06-06 ENCOUNTER — Telehealth: Payer: Self-pay | Admitting: Neurology

## 2020-06-24 MED FILL — CARVEDILOL 12.5 MG TABLET: 12.5 | 90 days supply | Qty: 180 | Fill #0

## 2020-06-24 MED FILL — CORLANOR 5 MG TABLET: 5 | 90 days supply | Qty: 180 | Fill #1

## 2020-06-28 ENCOUNTER — Encounter: Payer: Self-pay | Admitting: Neurology

## 2020-06-28 ENCOUNTER — Institutional Professional Consult (permissible substitution): Payer: 59 | Admitting: Neurology

## 2020-06-28 ENCOUNTER — Ambulatory Visit: Payer: 59 | Admitting: Neurology

## 2020-06-28 VITALS — BP 132/84 | HR 76 | Ht 64.0 in | Wt 203.0 lb

## 2020-06-28 DIAGNOSIS — G4719 Other hypersomnia: Secondary | ICD-10-CM

## 2020-06-28 DIAGNOSIS — R635 Abnormal weight gain: Secondary | ICD-10-CM

## 2020-06-28 DIAGNOSIS — E669 Obesity, unspecified: Secondary | ICD-10-CM | POA: Diagnosis not present

## 2020-06-28 DIAGNOSIS — R419 Unspecified symptoms and signs involving cognitive functions and awareness: Secondary | ICD-10-CM | POA: Diagnosis not present

## 2020-06-28 DIAGNOSIS — R0683 Snoring: Secondary | ICD-10-CM

## 2020-06-28 DIAGNOSIS — R0681 Apnea, not elsewhere classified: Secondary | ICD-10-CM

## 2020-06-28 NOTE — Progress Notes (Signed)
Subjective:    Patient ID: Joy Contreras is a 45 y.o. female.  HPI     Star Age, MD, PhD [facility] [address] [place], [place] [address]  Dear Dr. Daryll Drown,   I saw your patient, Joy Contreras, upon your kind request in my sleep clinic today for initial consultation of her sleep disorder, in particular, concern for underlying obstructive sleep apnea.  The patient is unaccompanied today.  As you know, Joy Contreras is a 45 year old right-handed woman with an underlying medical history of tachycardia, reflux disease, vitamin D deficiency, elevated blood pressure, hip pain, iron deficiency anemia, and obesity, who reports snoring and sleep disruption, nonrestorative sleep and difficulty maintaining sleep.  I reviewed your office note from 04/22/2020.  Her Epworth sleepiness score is 10 out of 24, fatigue severity score is 37 out of 63.  Her daughter and her husband have noted pauses in her breathing while she is asleep and rarely, she has woken up with a sense of gasping for air.  She denies any recurrent morning headaches or night to night nocturia and is not aware of any family history of sleep apnea.  She reports weight gain over the past 3 to 4 years.  She is working on weight loss.  She is a non-smoker and drinks alcohol up to 2 drinks on the weekends.  She limits her caffeine to about 2 of coffee per day on average. She admits that she does not always go to bed on time.  She is typically in bed around midnight or even later, rise time is around 6 AM.  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  She lives with her family including husband and teenage kids, ages 46 and 41. She does watch TV while in bed.  Her Past Medical History Is Significant For: Past Medical History:  Diagnosis Date  . Depression    History of Depression  . Dysrhythmia    tachycardia  . GERD (gastroesophageal reflux disease)    Tx with PPI   . Goiter 2006   Dr Altheimer. Dx with Autonomously functioning goiter during pregnancy. anti-TPO negative.   . Menorrhagia    Being tx by GYN  . Tachycardia    Required BB during pregnancy. Baseline 100-130 at rest    Her Past Surgical History Is Significant For: Past Surgical History:  Procedure Laterality Date  . BILATERAL SALPINGECTOMY Bilateral 11/01/2014   Procedure: BILATERAL SALPINGECTOMY;  Surgeon: Servando Salina, MD;  Location: [facility];  Service: Gynecology;  Laterality: Bilateral;  . CESAREAN SECTION  11/22/2001  . LEFT OOPHORECTOMY  2002   Dr Garwin Brothers. 2/2 symptomatice fibroma  . LYSIS OF ADHESION N/A 11/01/2014   Procedure: LYSIS OF ADHESION;  Surgeon: Servando Salina, MD;  Location: [facility];  Service: Gynecology;  Laterality: N/A;  . 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 ROBOTIC ASSISTED SALPINGO OOPHERECTOMY Right 12/27/2016   Procedure: ROBOTIC ASSISTED RIGHT OOPHORECTOMY AND PERITONEAL BIOPSY;  Surgeon: Janie Morning, MD;  Location: [facility];  Service: Gynecology;  Laterality: Right;  . ROBOTIC ASSISTED TOTAL HYSTERECTOMY N/A 11/01/2014   Procedure: ROBOTIC ASSISTED TOTAL HYSTERECTOMY;  Surgeon: Servando Salina, MD;  Location: [facility];  Service: Gynecology;  Laterality: N/A;    Her Family History Is Significant For: Family History  Problem Relation Age of Onset  . Seizures Father   . Hypertension Mother   . Bipolar disorder Mother   . Mental illness Brother   .  Stickler syndrome Sister   . Schizophrenia Brother   . ADD / ADHD Son     Her Social History Is Significant For: Social History   Socioeconomic History  . Marital status: Married    Spouse name: Not on file  . Number of children: Not on file  . Years of education: Not on file  . Highest education level: Not on file  Occupational History  . Not on file  Tobacco Use  . Smoking status: Never Smoker  . Smokeless tobacco: Never Used  Substance and Sexual Activity  . Alcohol use: Yes     Alcohol/week: 0.0 standard drinks    Comment: occassionally   . Drug use: No  . Sexual activity: Not on file  Other Topics Concern  . Not on file  Social History Narrative  . Not on file   Social Determinants of Health   Financial Resource Strain: Not on file  Food Insecurity: Not on file  Transportation Needs: Not on file  Physical Activity: Not on file  Stress: Not on file  Social Connections: Not on file    Her Allergies Are:  No Known Allergies:   Her Current Medications Are:  Outpatient Encounter Medications as of 06/28/2020  Medication Sig  . carvedilol (COREG) 12.5 MG tablet Take 1 tablet (12.5 mg total) by mouth 2 (two) times daily.  Marland Kitchen estradiol (CLIMARA - DOSED IN MG/24 HR) 0.1 mg/24hr patch Place 1 patch (0.1 mg total) onto the skin once a week.  . ivabradine (CORLANOR) 5 MG TABS tablet Take 1 tablet (5 mg total) by mouth 2 (two) times daily with a meal.  . Multiple Vitamins-Minerals (HAIR SKIN AND NAILS FORMULA) TABS Take 1 tablet by mouth 2 (two) times daily.  . nitroGLYCERIN (NITROSTAT) 0.4 MG SL tablet Place 1 tablet (0.4 mg total) under the tongue every 5 (five) minutes as needed for chest pain.  . pantoprazole (PROTONIX) 40 MG tablet Take 1 tablet (40 mg total) by mouth as needed.  . sulfamethoxazole-trimethoprim (BACTRIM) 400-80 MG tablet Take one pill after sex  . Vitamin D, Ergocalciferol, (DRISDOL) 1.25 MG (50000 UNIT) CAPS capsule Take 1 capsule (50,000 Units total) by mouth every 7 (seven) days.   No facility-administered encounter medications on file as of 06/28/2020.  :  Review of Systems:  Out of a complete 14 point review of systems, all are reviewed and negative with the exception of these symptoms as listed below: Review of Systems  Neurological:       Here for sleep consult. No prior sleep study, reports she does snore at night and has been told by family members that she will stop breathing.  Epworth Sleepiness Scale 0= would never doze 1=  slight chance of dozing 2= moderate chance of dozing 3= high chance of dozing  Sitting and reading:3 Watching TV:2 Sitting inactive in a public place (ex. Theater or meeting):1 As a passenger in a car for an hour without a break:1 Lying down to rest in the afternoon:3 Sitting and talking to someone:0 Sitting quietly after lunch (no alcohol):0 In a car, while stopped in traffic:0 Total:10     Objective:  Neurological Exam  Physical Exam Physical Examination:   Vitals:   06/28/20 1330  BP: 132/84  Pulse: 76  SpO2: 97%    General Examination: The patient is a very pleasant 45 y.o. female in no acute distress. She appears well-developed and well-nourished and well groomed.   HEENT: Normocephalic, atraumatic, pupils are equal, round and reactive  to light, extraocular tracking is good without limitation to gaze excursion or nystagmus noted. Hearing is grossly intact. Face is symmetric with normal facial animation. Speech is clear with no dysarthria noted. There is no hypophonia. There is no lip, neck/head, jaw or voice tremor. Neck is supple with full range of passive and active motion. There are no carotid bruits on auscultation. Oropharynx exam reveals: mild mouth dryness, good dental hygiene and mild airway crowding, due to small airway entry, tonsillar size of about 1+ bilaterally.  Small uvula noted, Mallampati class II.  Neck circumference is 15 1/8 inches.  She has a minimal to mild overbite.  Tongue protrudes centrally and palate elevates symmetrically.  Chest: Clear to auscultation without wheezing, rhonchi or crackles noted.  Heart: S1+S2+0, regular and normal without murmurs, rubs or gallops noted.   Abdomen: Soft, non-tender and non-distended.  Extremities: There is no pitting edema in the distal lower extremities bilaterally.   Skin: Warm and dry without trophic changes noted.   Musculoskeletal: exam reveals no obvious joint deformities, tenderness or joint swelling  or erythema.   Neurologically:  Mental status: The patient is awake, alert and oriented in all 4 spheres. Her immediate and remote memory, attention, language skills and fund of knowledge are appropriate. There is no evidence of aphasia, agnosia, apraxia or anomia. Speech is clear with normal prosody and enunciation. Thought process is linear. Mood is normal and affect is normal.  Cranial nerves II - XII are as described above under HEENT exam.  Motor exam: Normal bulk, strength and tone is noted. There is no tremor, Romberg is negative. Fine motor skills and coordination: grossly intact.  Cerebellar testing: No dysmetria or intention tremor. There is no truncal or gait ataxia.  Sensory exam: intact to light touch in the upper and lower extremities.  Gait, station and balance: She stands easily. No veering to one side is noted. No leaning to one side is noted. Posture is age-appropriate and stance is narrow based. Gait shows normal stride length and normal pace. No problems turning are noted. Tandem walk is unremarkable.                Assessment and Plan:  In summary, Joy Contreras is a very pleasant 45 y.o.-year old female with an underlying medical history of tachycardia, reflux disease, vitamin D deficiency, elevated blood pressure, hip pain, iron deficiency anemia, and obesity, whose history and physical exam concerning for obstructive sleep apnea (OSA). I had a long chat with the patient about my findings and the diagnosis of OSA, its prognosis and treatment options. We talked about medical treatments, surgical interventions and non-pharmacological approaches. I explained in particular the risks and ramifications of untreated moderate to severe OSA, especially with respect to developing cardiovascular disease down the Road, including congestive heart failure, difficult to treat hypertension, cardiac arrhythmias, or stroke. Even type 2 diabetes has, in part, been linked to untreated OSA.  Symptoms of untreated OSA include daytime sleepiness, memory problems, mood irritability and mood disorder such as depression and anxiety, lack of energy, as well as recurrent headaches, especially morning headaches. We talked about trying to maintain a healthy lifestyle in general, as well as the importance of weight control. We also talked about the importance of good sleep hygiene. I recommended the following at this time: sleep study.  I explained the sleep test procedure to the patient and also outlined possible surgical and non-surgical treatment options of OSA. I also explained the CPAP treatment option to  the patient, who indicated that she would be willing to try CPAP if the need arises. I explained the importance of being compliant with PAP treatment, not only for insurance purposes but primarily to improve Her symptoms, and for the patient's long term health benefit, including to reduce Her cardiovascular risks. I answered all her questions today and the patient was in agreement. I plan to see her back after the sleep study is completed and encouraged her to call with any interim questions, concerns, problems or updates.   Thank you very much for allowing me to participate in the care of this nice patient. If I can be of any further assistance to you please do not hesitate to call me at [phone].  Sincerely,   Star Age, MD, PhD

## 2020-06-28 NOTE — Patient Instructions (Signed)

## 2020-06-29 ENCOUNTER — Other Ambulatory Visit: Payer: Self-pay

## 2020-06-29 DIAGNOSIS — Z780 Asymptomatic menopausal state: Secondary | ICD-10-CM

## 2020-06-30 ENCOUNTER — Other Ambulatory Visit: Payer: Self-pay | Admitting: Internal Medicine

## 2020-06-30 MED ORDER — ESTRADIOL 0.1 MG/24HR TD PTWK
0.1000 mg | MEDICATED_PATCH | TRANSDERMAL | 3 refills | Status: DC
Start: 2020-06-30 — End: 2020-06-30

## 2020-06-30 MED FILL — ESTRADIOL 0.1 MG/DAY PATCH: 0.1 | 84 days supply | Qty: 12 | Fill #0

## 2020-07-05 ENCOUNTER — Telehealth: Payer: Self-pay

## 2020-07-05 NOTE — Telephone Encounter (Signed)
Appointment r/s for 06/28/20.

## 2020-07-05 NOTE — Telephone Encounter (Signed)
LVM for pt to call me back to schedule sleep study  

## 2020-07-16 DIAGNOSIS — Z1231 Encounter for screening mammogram for malignant neoplasm of breast: Secondary | ICD-10-CM | POA: Diagnosis not present

## 2020-08-04 ENCOUNTER — Ambulatory Visit (INDEPENDENT_AMBULATORY_CARE_PROVIDER_SITE_OTHER): Payer: 59 | Admitting: Internal Medicine

## 2020-08-04 ENCOUNTER — Encounter: Payer: Self-pay | Admitting: Internal Medicine

## 2020-08-04 ENCOUNTER — Other Ambulatory Visit: Payer: Self-pay

## 2020-08-04 VITALS — BP 149/87 | HR 70 | Temp 98.1°F | Ht 64.0 in | Wt 205.0 lb

## 2020-08-04 DIAGNOSIS — Z6835 Body mass index (BMI) 35.0-35.9, adult: Secondary | ICD-10-CM

## 2020-08-04 DIAGNOSIS — E559 Vitamin D deficiency, unspecified: Secondary | ICD-10-CM | POA: Diagnosis not present

## 2020-08-04 DIAGNOSIS — R030 Elevated blood-pressure reading, without diagnosis of hypertension: Secondary | ICD-10-CM

## 2020-08-04 DIAGNOSIS — E6609 Other obesity due to excess calories: Secondary | ICD-10-CM | POA: Diagnosis not present

## 2020-08-04 DIAGNOSIS — Z0000 Encounter for general adult medical examination without abnormal findings: Secondary | ICD-10-CM

## 2020-08-04 DIAGNOSIS — G4700 Insomnia, unspecified: Secondary | ICD-10-CM

## 2020-08-04 DIAGNOSIS — E7800 Pure hypercholesterolemia, unspecified: Secondary | ICD-10-CM | POA: Diagnosis not present

## 2020-08-04 NOTE — Progress Notes (Signed)
° °  Subjective:    Patient ID: Joy Contreras, female    DOB: Nov 07, 1975, 45 y.o.   MRN: [id number]  CC: follow up for blood pressure  HPI  Joy Contreras is a 45 year old woman with PMH of GERD, elevated blood pressure, tachycardia, vitamin D deficiency, fatigue, insomnia.    Today Joy Contreras reports that she is doing overall well. Her BP is elevated today and she notes sometimes forgetting to take her evening dose of coreg.  She notes taking it once per day most of the time, but then forgetting the evening dose.  We discussed multiple options including pill box, switching to once a day Coreg or possibly adding a new medication.  She would like to try the pill box and decreasing salt intake.   For her insomnia and snoring, she is having her sleep study tomorrow evening and we will see what this shows.  I also offered to refer her for CBT with our counselor and she is interested in this.   We discussed weight loss and ways to improve her weight.  She is amenable to medication therapy, but these may be expensive.  She requested having her lipids checked today, and last check her LDL was 111 and that was 5 years ago.  We will recheck at next visit when we do all of her updated blood work.   Review of Systems  Constitutional: Positive for fatigue (chronic). Negative for activity change, appetite change and diaphoresis.  Respiratory: Negative for cough and shortness of breath.   Cardiovascular: Positive for palpitations (occasional). Negative for chest pain and leg swelling.  Neurological: Negative for dizziness and weakness.  Psychiatric/Behavioral: Negative for decreased concentration and dysphoric mood.       Objective:   Physical Exam Vitals and nursing note reviewed.  Constitutional:      General: She is not in acute distress.    Appearance: Normal appearance. She is not toxic-appearing.  HENT:     Head: Normocephalic and atraumatic.  Cardiovascular:     Rate and Rhythm: Normal rate and regular  rhythm.     Heart sounds: No murmur heard.   Pulmonary:     Effort: Pulmonary effort is normal. No respiratory distress.     Breath sounds: Normal breath sounds. No stridor.  Skin:    General: Skin is warm and dry.  Neurological:     General: No focal deficit present.     Mental Status: She is alert and oriented to person, place, and time. Mental status is at baseline.  Psychiatric:        Mood and Affect: Mood normal.        Behavior: Behavior normal.    She is due for Vitamin D recheck and lipid panel.  She would like to defer these to next visit.       Assessment & Plan:  RTC in 3 months for BP check.

## 2020-08-04 NOTE — Assessment & Plan Note (Signed)
She notes taking her medication as prescribed, but might have missed a couple of doses.  We will recheck her level at next visit and see if she needs further high dose vitamin D.

## 2020-08-04 NOTE — Assessment & Plan Note (Signed)
Continues to be elevated on initial and recheck.  She reports not always taking her Coreg evening dose.    Plan Pill box Increase adherence to twice a day dosing DASH diet Consider changing to once a day coreg or adding another agent at next visit.

## 2020-08-04 NOTE — Assessment & Plan Note (Signed)
We discussed weight loss options today including calorie counting, avoiding snacking, and medications.  At next visit we will check an A1C.  She notes the need for the above and she will continue to work on this.  She does have a sleep study scheduled tomorrow.  Treatment of possible OSA could help with weight loss.

## 2020-08-04 NOTE — Assessment & Plan Note (Signed)
Discussed today.  Chronic and not well controlled.  We discussed her sleep study and CBT for insomnia.  She is open to seeing Dr. Theodis Shove in our clinic for this issues.   Plan Sleep study planned for tomorrow evening [facility] referral for CBT

## 2020-08-04 NOTE — Assessment & Plan Note (Signed)
She is 44.  New recommendations for colon cancer screening start at 17.  Referral for colonoscopy today.

## 2020-08-04 NOTE — Patient Instructions (Signed)
Ulis Rias - -  Please start taking your Coreg twice per day.   Please follow up for colonoscopy.   Please follow up with Dr. Theodis Shove for insomnia.

## 2020-08-05 ENCOUNTER — Encounter: Payer: 59 | Admitting: Internal Medicine

## 2020-08-05 ENCOUNTER — Ambulatory Visit (INDEPENDENT_AMBULATORY_CARE_PROVIDER_SITE_OTHER): Payer: 59 | Admitting: Neurology

## 2020-08-05 DIAGNOSIS — G4733 Obstructive sleep apnea (adult) (pediatric): Secondary | ICD-10-CM

## 2020-08-05 DIAGNOSIS — R0683 Snoring: Secondary | ICD-10-CM

## 2020-08-05 DIAGNOSIS — G4720 Circadian rhythm sleep disorder, unspecified type: Secondary | ICD-10-CM

## 2020-08-05 DIAGNOSIS — R419 Unspecified symptoms and signs involving cognitive functions and awareness: Secondary | ICD-10-CM

## 2020-08-05 DIAGNOSIS — R0681 Apnea, not elsewhere classified: Secondary | ICD-10-CM

## 2020-08-05 DIAGNOSIS — E669 Obesity, unspecified: Secondary | ICD-10-CM

## 2020-08-05 DIAGNOSIS — R635 Abnormal weight gain: Secondary | ICD-10-CM

## 2020-08-05 DIAGNOSIS — G4719 Other hypersomnia: Secondary | ICD-10-CM

## 2020-08-19 ENCOUNTER — Ambulatory Visit (INDEPENDENT_AMBULATORY_CARE_PROVIDER_SITE_OTHER): Payer: 59 | Admitting: Podiatry

## 2020-08-19 ENCOUNTER — Ambulatory Visit (INDEPENDENT_AMBULATORY_CARE_PROVIDER_SITE_OTHER): Payer: 59

## 2020-08-19 ENCOUNTER — Other Ambulatory Visit: Payer: Self-pay

## 2020-08-19 DIAGNOSIS — M722 Plantar fascial fibromatosis: Secondary | ICD-10-CM

## 2020-08-19 NOTE — Addendum Note (Signed)
Addended by: Star Age on: 08/19/2020 10:48 AM   Modules accepted: Orders

## 2020-08-19 NOTE — Procedures (Signed)
PATIENT'S NAME:  Joy Contreras, Joy Contreras DOB:      12/29/1975      MR#:    [id number]     DATE OF RECORDING: 08/05/2020 REFERRING M.D.:  Gilles Chiquito, MD Study Performed:   Baseline Polysomnogram HISTORY: 45 year old woman with a history of tachycardia, reflux disease, vitamin D deficiency, elevated blood pressure, hip pain, iron deficiency anemia, and obesity, who reports snoring and sleep disruption, nonrestorative sleep and difficulty maintaining sleep. The patient endorsed the Epworth Sleepiness Scale at 10 points. The patient's weight 203 pounds with a height of 64 (inches), resulting in a BMI of 34.6 kg/m2. The patient's neck circumference measured 15.2 inches.  CURRENT MEDICATIONS: Coreg, Climara, Corlanor, Multivitamins, Nitrostaat, Protonix, Bactrim, Drisdol   PROCEDURE:  This is a multichannel digital polysomnogram utilizing the Somnostar 11.2 system.  Electrodes and sensors were applied and monitored per AASM Specifications.   EEG, EOG, Chin and Limb EMG, were sampled at 200 Hz.  ECG, Snore and Nasal Pressure, Thermal Airflow, Respiratory Effort, CPAP Flow and Pressure, Oximetry was sampled at 50 Hz. Digital video and audio were recorded.      BASELINE STUDY  Lights Out was at 21:47 and Lights On at 05:11.  Total recording time (TRT) was 444 minutes, with a total sleep time (TST) of 403.5 minutes.   The patient's sleep latency to persistent sleep was 37 minutes. REM latency was 61 minutes, which is mildly reduced. The sleep efficiency was 90.9%.     SLEEP ARCHITECTURE: WASO (Wake after sleep onset) was 27 minutes with mild sleep fragmentation noted. There were 9 minutes in Stage N1, 215.5 minutes Stage N2, 96 minutes Stage N3 and 83 minutes in Stage REM.  The percentage of Stage N1 was 2.2%, Stage N2 was 53.4%, which is normal, Stage N3 was 23.8%, which is mildly increased and Stage R (REM sleep) was 20.6%, which is normal.  The arousals were noted as: 48 were spontaneous, 0 were associated  with PLMs, 29 were associated with respiratory events.  RESPIRATORY ANALYSIS:  There were a total of 86 respiratory events:  33 obstructive apneas, 0 central apneas and 2 mixed apneas with a total of 35 apneas and an apnea index (AI) of 5.2 /hour. There were 51 hypopneas with a hypopnea index of 7.6 /hour. The patient also had 0 respiratory event related arousals (RERAs).      The total APNEA/HYPOPNEA INDEX (AHI) was 12.8/hour and the total RESPIRATORY DISTURBANCE INDEX was  12.8 /hour.  57 events occurred in REM sleep and 34 events in NREM. The REM AHI was  41.2 /hour, versus a non-REM AHI of 5.4. The patient spent 127 minutes of total sleep time in the supine position and 277 minutes in non-supine.. The supine AHI was 12.7 versus a non-supine AHI of 12.8.  OXYGEN SATURATION & C02:  The Wake baseline 02 saturation was 96%, with the lowest being 74%. Time spent below 89% saturation equaled 19 minutes.  PERIODIC LIMB MOVEMENTS:   The patient had a total of 0 Periodic Limb Movements.  The Periodic Limb Movement (PLM) index was 0 and the PLM Arousal index was 0/hour.  Audio and video analysis did not show any abnormal or unusual movements, behaviors, phonations or vocalizations. The patient took no bathroom breaks. Intermittent snoring was noted, ranging from mild to loud. The EKG was in keeping with normal sinus rhythm (NSR).  Post-study, the patient indicated that sleep was the same as usual.   IMPRESSION:  1. Obstructive Sleep Apnea (OSA) 2. Dysfunctions  associated with sleep stages or arousal from sleep  RECOMMENDATIONS:  1. This study demonstrates overall mild obstructive sleep apnea, severe in REM sleep with a total AHI of 12.8/hour, REM AHI of 41.2/hour, and significant - REM related - desaturations noted, nadir of 74%. Given the patient's medical history and sleep related complaints, treatment with positive airway pressure is recommended; this can be achieved in the form of autoPAP.  Alternatively, a full-night CPAP titration study would allow optimization of therapy if needed. Other treatment options may include avoidance of supine sleep position along with weight loss, upper airway or jaw surgery in selected patients or the use of an oral appliance in certain patients. ENT evaluation and/or consultation with a maxillofacial surgeon or dentist may be feasible in some instances.    2. Please note that untreated obstructive sleep apnea may carry additional perioperative morbidity. Patients with significant obstructive sleep apnea should receive perioperative PAP therapy and the surgeons and particularly the anesthesiologist should be informed of the diagnosis and the severity of the sleep disordered breathing. 3. This study shows sleep fragmentation and abnormal sleep stage percentages; these are nonspecific findings and per se do not signify an intrinsic sleep disorder or a cause for the patient's sleep-related symptoms. Causes include (but are not limited to) the first night effect of the sleep study, circadian rhythm disturbances, medication effect or an underlying mood disorder or medical problem.  4. The patient should be cautioned not to drive, work at heights, or operate dangerous or heavy equipment when tired or sleepy. Review and reiteration of good sleep hygiene measures should be pursued with any patient. 5. The patient will be seen in follow-up by Dr. Rexene Alberts at [facility] for discussion of the test results and further management strategies. The referring provider will be notified of the test results.  I certify that I have reviewed the entire raw data recording prior to the issuance of this report in accordance with the Standards of Accreditation of the American Academy of Sleep Medicine (AASM)  Star Age, MD, PhD Diplomat, American Board of Neurology and Sleep Medicine (Neurology and Sleep Medicine)

## 2020-08-19 NOTE — Progress Notes (Addendum)
Patient referred by Dr. Daryll Drown, seen by me on 06/28/20, diagnostic PSG on 08/05/20.    Please call and notify the patient that the recent sleep study showed obstructive sleep apnea. OSA is overall mild, but much more pronounced in REM/dream sleep with significant oxygen drops, as low as 74%. OSA is worth treating to see if she feels better after treatment. To that end I recommend treatment for this in the form of autoPAP, which means, that we don't have to bring her back for a second sleep study with CPAP, but will let him try an autoPAP machine at home, through a DME company (of her choice, or as per insurance requirement). The DME representative will educate her on how to use the machine, how to put the mask on, etc. I have placed an order in the chart. Please send referral, talk to patient, send report to referring MD. We will need a FU in sleep clinic for 10 weeks post-PAP set up, please arrange that with me or one of our NPs. Thanks,   Star Age, MD, PhD [facility])

## 2020-08-23 ENCOUNTER — Encounter: Payer: Self-pay | Admitting: Podiatry

## 2020-08-23 NOTE — Progress Notes (Signed)
Subjective:  Patient ID: Joy Contreras, female    DOB: 23-Jul-1975,  MRN: [id number]  Chief Complaint  Patient presents with  . Foot Pain    Left heel pain PT stated that the pain is worse in the morning when she first takes a step     45 y.o. female presents with the above complaint.  Patient presents with complaint of left plantar fasciitis/heel pain.  Patient states is worse in the morning and taken the first day.  She states that is painful to touch.  She is in most: Employee constantly on her foot.  She states that she has had plantar fasciitis in the past which was treated and has not been bothering her since then until now.  She states is painful to walk on it.  She is try some conservative treatment options none of which has helped.  She would like to discuss treatment options.  She has not seen anyone else prior to see me for this.   Review of Systems: Negative except as noted in the HPI. Denies N/V/F/Ch.  Past Medical History:  Diagnosis Date  . Depression    History of Depression  . Dysrhythmia    tachycardia  . GERD (gastroesophageal reflux disease)    Tx with PPI  . Goiter 2006   Dr Altheimer. Dx with Autonomously functioning goiter during pregnancy. anti-TPO negative.   . Menorrhagia    Being tx by GYN  . Tachycardia    Required BB during pregnancy. Baseline 100-130 at rest    Current Outpatient Medications:  .  carvedilol (COREG) 12.5 MG tablet, TAKE 1 TABLET BY MOUTH TWICE DAILY., Disp: 180 tablet, Rfl: 3 .  estradiol (CLIMARA - DOSED IN MG/24 HR) 0.1 mg/24hr patch, PLACE 1 PATCH (0.1 MG TOTAL) ONTO THE SKIN ONCE A WEEK., Disp: 12 patch, Rfl: 3 .  ivabradine (CORLANOR) 5 MG TABS tablet, TAKE 1 TABLET (5 MG TOTAL) BY MOUTH 2 (TWO) TIMES DAILY WITH A MEAL., Disp: 180 tablet, Rfl: 3 .  Multiple Vitamins-Minerals (HAIR SKIN AND NAILS FORMULA) TABS, Take 1 tablet by mouth 2 (two) times daily., Disp: , Rfl:  .  nitroGLYCERIN (NITROSTAT) 0.4 MG SL tablet, PLACE 1  TABLET (0.4 MG TOTAL) UNDER THE TONGUE EVERY 5 (FIVE) MINUTES AS NEEDED FOR CHEST PAIN., Disp: 25 tablet, Rfl: 3 .  pantoprazole (PROTONIX) 40 MG tablet, Take 1 tablet (40 mg total) by mouth as needed., Disp: 90 tablet, Rfl: 3 .  Vitamin D, Ergocalciferol, (DRISDOL) 1.25 MG (50000 UNIT) CAPS capsule, TAKE 1 CAPSULE (50,000 UNITS TOTAL) BY MOUTH EVERY 7 (SEVEN) DAYS., Disp: 8 capsule, Rfl: 0  Social History   Tobacco Use  Smoking Status Never Smoker  Smokeless Tobacco Never Used    No Known Allergies Objective:  There were no vitals filed for this visit. There is no height or weight on file to calculate BMI. Constitutional Well developed. Well nourished.  Vascular Dorsalis pedis pulses palpable bilaterally. Posterior tibial pulses palpable bilaterally. Capillary refill normal to all digits.  No cyanosis or clubbing noted. Pedal hair growth normal.  Neurologic Normal speech. Oriented to person, place, and time. Epicritic sensation to light touch grossly present bilaterally.  Dermatologic Nails well groomed and normal in appearance. No open wounds. No skin lesions.  Orthopedic: Normal joint ROM without pain or crepitus bilaterally. No visible deformities. Tender to palpation at the calcaneal tuber left. No pain with calcaneal squeeze left. Ankle ROM diminished range of motion left. Silfverskiold Test: positive left.  Radiographs: Taken and reviewed. No acute fractures or dislocations. No evidence of stress fracture.  Plantar heel spur present. Posterior heel spur absent.   Assessment:   1. Plantar fasciitis of left foot    Plan:  Patient was evaluated and treated and all questions answered.  Plantar Fasciitis, left - XR reviewed as above.  - Educated on icing and stretching. Instructions given.  - Injection delivered to the plantar fascia as below. - DME: Plantar Fascial Brace - Pharmacologic management: None  Procedure: Injection Tendon/Ligament Location: Left  plantar fascia at the glabrous junction; medial approach. Skin Prep: alcohol Injectate: 0.5 cc 0.5% marcaine plain, 0.5 cc of 1% Lidocaine, 0.5 cc kenalog 10. Disposition: Patient tolerated procedure well. Injection site dressed with a band-aid.  No follow-ups on file.

## 2020-08-25 ENCOUNTER — Telehealth: Payer: Self-pay

## 2020-08-25 NOTE — Telephone Encounter (Signed)
I called pt. No answer, left a message asking pt to call me back.   

## 2020-08-25 NOTE — Telephone Encounter (Signed)
-----   Message from Star Age, MD sent at 08/19/2020 10:47 AM EDT ----- Patient referred by Dr. Daryll Drown, seen by me on 07/04/20, diagnostic PSG on 08/05/20.    Please call and notify the patient that the recent sleep study showed obstructive sleep apnea. OSA is overall mild, but much more pronounced in REM/dream sleep with significant oxygen drops, as low as 74%. OSA is worth treating to see if she feels better after treatment. To that end I recommend treatment for this in the form of autoPAP, which means, that we don't have to bring her back for a second sleep study with CPAP, but will let him try an autoPAP machine at home, through a DME company (of her choice, or as per insurance requirement). The DME representative will educate her on how to use the machine, how to put the mask on, etc. I have placed an order in the chart. Please send referral, talk to patient, send report to referring MD. We will need a FU in sleep clinic for 10 weeks post-PAP set up, please arrange that with me or one of our NPs. Thanks,   Star Age, MD, PhD [facility])

## 2020-08-29 NOTE — Telephone Encounter (Signed)
I called pt. I advised pt that Dr. Rexene Alberts reviewed their sleep study results and found that pt has mild to moderate osa with O2 desats as low as 74% . Dr. Rexene Alberts recommends that pt start an autopap at home for treatment. I reviewed PAP compliance expectations with the pt. Pt is agreeable to starting an auto-PAP. I advised pt that an order will be sent to a DME, [facility], and [facility] will call the pt within about one week after they file with the pt's insurance. [facility] will show the pt how to use the machine, fit for masks, and troubleshoot the auto-PAP if needed. Pt understands there is a shortage of CPAP and start date can be 8-12 weeks out. A letter with all of this information in it will be mailed to the pt as a reminder. I verified with the pt that the address we have on file is correct. Pt verbalized understanding of results. Pt had no questions at this time but was encouraged to call back if questions arise. I have sent the order to [facility] and have received confirmation that they have received the order.

## 2020-08-29 NOTE — Telephone Encounter (Signed)
I called pt. No answer, left a message asking pt to call me back.   

## 2020-09-02 ENCOUNTER — Encounter: Payer: Self-pay | Admitting: Gastroenterology

## 2020-09-06 ENCOUNTER — Institutional Professional Consult (permissible substitution): Payer: 59 | Admitting: Behavioral Health

## 2020-09-23 ENCOUNTER — Other Ambulatory Visit: Payer: Self-pay

## 2020-09-23 ENCOUNTER — Ambulatory Visit: Payer: 59 | Admitting: Podiatry

## 2020-10-06 ENCOUNTER — Other Ambulatory Visit (HOSPITAL_COMMUNITY): Payer: Self-pay

## 2020-10-06 MED FILL — Estradiol TD Patch Weekly 0.1 MG/24HR: TRANSDERMAL | 84 days supply | Qty: 12 | Fill #0 | Status: AC

## 2020-10-06 MED FILL — Ivabradine HCl Tab 5 MG (Base Equiv): ORAL | 90 days supply | Qty: 180 | Fill #0 | Status: AC

## 2020-10-06 MED FILL — Carvedilol Tab 12.5 MG: ORAL | 90 days supply | Qty: 180 | Fill #0 | Status: AC

## 2020-10-07 ENCOUNTER — Other Ambulatory Visit (HOSPITAL_COMMUNITY): Payer: Self-pay

## 2020-10-11 ENCOUNTER — Institutional Professional Consult (permissible substitution): Payer: 59 | Admitting: Behavioral Health

## 2020-10-14 ENCOUNTER — Ambulatory Visit: Payer: 59 | Admitting: Podiatry

## 2020-10-14 ENCOUNTER — Other Ambulatory Visit (HOSPITAL_COMMUNITY): Payer: Self-pay

## 2020-10-14 ENCOUNTER — Other Ambulatory Visit: Payer: Self-pay

## 2020-10-14 DIAGNOSIS — M722 Plantar fascial fibromatosis: Secondary | ICD-10-CM

## 2020-10-14 DIAGNOSIS — Q666 Other congenital valgus deformities of feet: Secondary | ICD-10-CM | POA: Diagnosis not present

## 2020-10-18 ENCOUNTER — Encounter: Payer: Self-pay | Admitting: Podiatry

## 2020-10-18 NOTE — Progress Notes (Signed)
Subjective:  Patient ID: Joy Contreras, female    DOB: [birth year],  MRN: [id number]  Chief Complaint  Patient presents with  . Plantar Fasciitis    PT stated that her foot is doing better than it was    45 y.o. female presents with the above complaint.  Patient presents for follow-up on left plantar fasciitis/heel pain.  He states is doing a lot better than before.  He states that the injection helped considerably.  The brace is also helping.  He would like to discuss future treatment options.  He is about 70 to 80% better.  Review of Systems: Negative except as noted in the HPI. Denies N/V/F/Ch.  Past Medical History:  Diagnosis Date  . Depression    History of Depression  . Dysrhythmia    tachycardia  . GERD (gastroesophageal reflux disease)    Tx with PPI  . Goiter 2006   Dr Altheimer. Dx with Autonomously functioning goiter during pregnancy. anti-TPO negative.   . Menorrhagia    Being tx by GYN  . Tachycardia    Required BB during pregnancy. Baseline 100-130 at rest    Current Outpatient Medications:  .  carvedilol (COREG) 12.5 MG tablet, Take 1 tablet (12.5 mg total) by mouth 2 (two) times daily., Disp: 180 tablet, Rfl: 3 .  estradiol (CLIMARA - DOSED IN MG/24 HR) 0.1 mg/24hr patch, PLACE 1 PATCH (0.1 MG TOTAL) ONTO THE SKIN ONCE A WEEK., Disp: 12 patch, Rfl: 3 .  ivabradine (CORLANOR) 5 MG TABS tablet, Take 1 tablet (5 mg total) by mouth 2 (two) times daily with a meal., Disp: 180 tablet, Rfl: 3 .  Multiple Vitamins-Minerals (HAIR SKIN AND NAILS FORMULA) TABS, Take 1 tablet by mouth 2 (two) times daily., Disp: , Rfl:  .  nitroGLYCERIN (NITROSTAT) 0.4 MG SL tablet, PLACE 1 TABLET (0.4 MG TOTAL) UNDER THE TONGUE EVERY 5 (FIVE) MINUTES AS NEEDED FOR CHEST PAIN., Disp: 25 tablet, Rfl: 3 .  pantoprazole (PROTONIX) 40 MG tablet, Take 1 tablet (40 mg total) by mouth as needed., Disp: 90 tablet, Rfl: 3 .  Vitamin D, Ergocalciferol, (DRISDOL) 1.25 MG (50000 UNIT) CAPS capsule,  TAKE 1 CAPSULE (50,000 UNITS TOTAL) BY MOUTH EVERY 7 (SEVEN) DAYS., Disp: 8 capsule, Rfl: 0  Social History   Tobacco Use  Smoking Status Never Smoker  Smokeless Tobacco Never Used    No Known Allergies Objective:  There were no vitals filed for this visit. There is no height or weight on file to calculate BMI. Constitutional Well developed. Well nourished.  Vascular Dorsalis pedis pulses palpable bilaterally. Posterior tibial pulses palpable bilaterally. Capillary refill normal to all digits.  No cyanosis or clubbing noted. Pedal hair growth normal.  Neurologic Normal speech. Oriented to person, place, and time. Epicritic sensation to light touch grossly present bilaterally.  Dermatologic Nails well groomed and normal in appearance. No open wounds. No skin lesions.  Orthopedic: Normal joint ROM without pain or crepitus bilaterally. No visible deformities. Tender to palpation at the calcaneal tuber left. No pain with calcaneal squeeze left. Ankle ROM diminished range of motion left. Silfverskiold Test: positive left.   Radiographs: Taken and reviewed. No acute fractures or dislocations. No evidence of stress fracture.  Plantar heel spur present. Posterior heel spur absent.   Assessment:   1. Plantar fasciitis of left foot   2. Pes planovalgus    Plan:  Patient was evaluated and treated and all questions answered.  Plantar Fasciitis, left - XR reviewed as above.  -  Educated on icing and stretching. Instructions given.  -Second injection delivered to the plantar fascia as below. - DME: Plantar Fascial Brace - Pharmacologic management: None   Pes planovalgus -I splinted the patient the etiology of pes planovalgus and various treatment options were extensively discussed.  Given the patient also has a symptoms of plantar fasciitis I believe patient will benefit from custom-made orthotics to help control the hindfoot motion support the arch of the foot take the stress  away from the heel.  Patient agrees with plan like to proceed with custom-made orthotics -He will be scheduled to make custom-made orthotics  Procedure: Injection Tendon/Ligament Location: Left plantar fascia at the glabrous junction; medial approach. Skin Prep: alcohol Injectate: 0.5 cc 0.5% marcaine plain, 0.5 cc of 1% Lidocaine, 0.5 cc kenalog 10. Disposition: Patient tolerated procedure well. Injection site dressed with a band-aid.  No follow-ups on file.

## 2020-10-26 ENCOUNTER — Telehealth: Payer: Self-pay | Admitting: Podiatry

## 2020-10-26 NOTE — Telephone Encounter (Signed)
Left message for pt to call to discuss appt on 7.8 as EJ is out of the office that day and appt needs to be cxled.

## 2020-11-08 NOTE — Addendum Note (Signed)
Addended by: Hulan Fray on: 11/08/2020 06:18 PM   Modules accepted: Orders

## 2020-11-10 ENCOUNTER — Telehealth: Payer: Self-pay | Admitting: Podiatry

## 2020-11-10 NOTE — Telephone Encounter (Signed)
Lvm for pt to call to r/s appt from 7.8 as Ej is out of the office.Marland KitchenMarland Kitchen

## 2020-11-11 ENCOUNTER — Ambulatory Visit: Payer: 59 | Admitting: Podiatry

## 2020-11-15 ENCOUNTER — Encounter: Payer: Self-pay | Admitting: *Deleted

## 2020-11-18 ENCOUNTER — Other Ambulatory Visit: Payer: 59

## 2020-11-18 ENCOUNTER — Encounter: Payer: 59 | Admitting: Gastroenterology

## 2020-12-09 ENCOUNTER — Other Ambulatory Visit (HOSPITAL_COMMUNITY): Payer: Self-pay

## 2020-12-09 MED ORDER — CARESTART COVID-19 HOME TEST VI KIT
PACK | 0 refills | Status: DC
  Filled 2020-12-09: qty 4, 4d supply, fill #0

## 2020-12-13 ENCOUNTER — Other Ambulatory Visit (HOSPITAL_COMMUNITY): Payer: Self-pay

## 2020-12-13 ENCOUNTER — Telehealth: Payer: Self-pay | Admitting: Internal Medicine

## 2020-12-13 MED ORDER — BENZONATATE 100 MG PO CAPS
100.0000 mg | ORAL_CAPSULE | Freq: Four times a day (QID) | ORAL | 1 refills | Status: DC | PRN
  Filled 2020-12-13: qty 30, 8d supply, fill #0

## 2020-12-13 NOTE — Telephone Encounter (Signed)
Discussed with Joy Contreras a chronic cough post COVID infection.  Symptoms appear to be post-viral cough.  No physical exam done at this time.  Will plan to trial tessalon perles and if she is not improving in next 1-2 weeks, we will see her in clinic.  Gilles Chiquito, MD

## 2020-12-15 ENCOUNTER — Other Ambulatory Visit (HOSPITAL_COMMUNITY): Payer: Self-pay

## 2020-12-15 MED ORDER — CARESTART COVID-19 HOME TEST VI KIT
PACK | 0 refills | Status: DC
  Filled 2020-12-15: qty 4, 4d supply, fill #0

## 2020-12-19 ENCOUNTER — Telehealth: Payer: 59

## 2021-01-06 DIAGNOSIS — G4733 Obstructive sleep apnea (adult) (pediatric): Secondary | ICD-10-CM | POA: Diagnosis not present

## 2021-01-17 ENCOUNTER — Telehealth: Payer: Self-pay | Admitting: *Deleted

## 2021-01-17 ENCOUNTER — Encounter: Payer: 59 | Admitting: Gastroenterology

## 2021-01-17 NOTE — Telephone Encounter (Signed)
Unable to reach patient, no return call.  Missed appointment letter mailed.

## 2021-01-18 ENCOUNTER — Telehealth: Payer: Self-pay | Admitting: Neurology

## 2021-01-18 NOTE — Telephone Encounter (Signed)
Called pt to schedule Initial Cpap appt. There was no answer and VM was left for pt to call back and schedule. Pt will need to schedule between 02/07/21-04/08/21 to be in compliance.

## 2021-01-27 ENCOUNTER — Encounter: Payer: 59 | Admitting: Gastroenterology

## 2021-02-06 DIAGNOSIS — G4733 Obstructive sleep apnea (adult) (pediatric): Secondary | ICD-10-CM | POA: Diagnosis not present

## 2021-02-16 ENCOUNTER — Other Ambulatory Visit: Payer: Self-pay | Admitting: Internal Medicine

## 2021-02-16 ENCOUNTER — Other Ambulatory Visit (HOSPITAL_COMMUNITY): Payer: Self-pay

## 2021-02-16 MED FILL — Estradiol TD Patch Weekly 0.1 MG/24HR: TRANSDERMAL | 84 days supply | Qty: 12 | Fill #1 | Status: AC

## 2021-02-16 MED FILL — Ivabradine HCl Tab 5 MG (Base Equiv): ORAL | 90 days supply | Qty: 180 | Fill #1 | Status: AC

## 2021-02-17 ENCOUNTER — Other Ambulatory Visit (HOSPITAL_COMMUNITY): Payer: Self-pay

## 2021-02-17 MED ORDER — CARVEDILOL 12.5 MG PO TABS
12.5000 mg | ORAL_TABLET | Freq: Two times a day (BID) | ORAL | 3 refills | Status: DC
  Filled 2021-02-17: qty 180, 90d supply, fill #0
  Filled 2021-07-11: qty 180, 90d supply, fill #1

## 2021-02-23 ENCOUNTER — Other Ambulatory Visit (HOSPITAL_COMMUNITY): Payer: Self-pay

## 2021-02-24 ENCOUNTER — Other Ambulatory Visit (HOSPITAL_COMMUNITY): Payer: Self-pay

## 2021-02-24 MED ORDER — CARESTART COVID-19 HOME TEST VI KIT
PACK | 0 refills | Status: DC
  Filled 2021-02-24: qty 4, 4d supply, fill #0

## 2021-03-08 DIAGNOSIS — G4733 Obstructive sleep apnea (adult) (pediatric): Secondary | ICD-10-CM | POA: Diagnosis not present

## 2021-03-10 ENCOUNTER — Other Ambulatory Visit: Payer: Self-pay

## 2021-03-10 ENCOUNTER — Encounter: Payer: Self-pay | Admitting: Internal Medicine

## 2021-03-10 ENCOUNTER — Ambulatory Visit (INDEPENDENT_AMBULATORY_CARE_PROVIDER_SITE_OTHER): Payer: 59 | Admitting: Internal Medicine

## 2021-03-10 ENCOUNTER — Ambulatory Visit (AMBULATORY_SURGERY_CENTER): Payer: 59 | Admitting: *Deleted

## 2021-03-10 ENCOUNTER — Other Ambulatory Visit (HOSPITAL_COMMUNITY): Payer: Self-pay

## 2021-03-10 ENCOUNTER — Encounter: Payer: Self-pay | Admitting: Gastroenterology

## 2021-03-10 VITALS — Ht 64.0 in | Wt 200.0 lb

## 2021-03-10 DIAGNOSIS — Z90722 Acquired absence of ovaries, bilateral: Secondary | ICD-10-CM

## 2021-03-10 DIAGNOSIS — E049 Nontoxic goiter, unspecified: Secondary | ICD-10-CM | POA: Diagnosis not present

## 2021-03-10 DIAGNOSIS — R030 Elevated blood-pressure reading, without diagnosis of hypertension: Secondary | ICD-10-CM | POA: Diagnosis not present

## 2021-03-10 DIAGNOSIS — K219 Gastro-esophageal reflux disease without esophagitis: Secondary | ICD-10-CM

## 2021-03-10 DIAGNOSIS — E559 Vitamin D deficiency, unspecified: Secondary | ICD-10-CM

## 2021-03-10 DIAGNOSIS — Z90710 Acquired absence of both cervix and uterus: Secondary | ICD-10-CM | POA: Diagnosis not present

## 2021-03-10 DIAGNOSIS — E6609 Other obesity due to excess calories: Secondary | ICD-10-CM | POA: Diagnosis not present

## 2021-03-10 DIAGNOSIS — M25552 Pain in left hip: Secondary | ICD-10-CM | POA: Diagnosis not present

## 2021-03-10 DIAGNOSIS — Z1211 Encounter for screening for malignant neoplasm of colon: Secondary | ICD-10-CM

## 2021-03-10 DIAGNOSIS — D509 Iron deficiency anemia, unspecified: Secondary | ICD-10-CM | POA: Diagnosis not present

## 2021-03-10 DIAGNOSIS — M899 Disorder of bone, unspecified: Secondary | ICD-10-CM

## 2021-03-10 DIAGNOSIS — Z9079 Acquired absence of other genital organ(s): Secondary | ICD-10-CM

## 2021-03-10 DIAGNOSIS — Z0000 Encounter for general adult medical examination without abnormal findings: Secondary | ICD-10-CM | POA: Diagnosis not present

## 2021-03-10 DIAGNOSIS — Z6835 Body mass index (BMI) 35.0-35.9, adult: Secondary | ICD-10-CM

## 2021-03-10 DIAGNOSIS — R000 Tachycardia, unspecified: Secondary | ICD-10-CM | POA: Diagnosis not present

## 2021-03-10 MED ORDER — NA SULFATE-K SULFATE-MG SULF 17.5-3.13-1.6 GM/177ML PO SOLN
1.0000 | Freq: Once | ORAL | 0 refills | Status: AC
  Filled 2021-03-10: qty 354, 1d supply, fill #0

## 2021-03-10 NOTE — Assessment & Plan Note (Signed)
She has persistent elevated BP.  I think we may be getting to the point of adding a second medication.  She notes inconsistent use of her Coreg, even the morning dose.  I advised her to start taking this medication and recheck her blood pressure at work in the next few weeks.    Plan Continue Coreg, more consistent use.

## 2021-03-10 NOTE — Addendum Note (Signed)
Addended by: Truddie Crumble on: 03/10/2021 12:22 PM   Modules accepted: Orders

## 2021-03-10 NOTE — Assessment & Plan Note (Signed)
Monitor for changes.  CBC Today.  Has been corrected and normal at last check.  Add on iron studies if low H/H

## 2021-03-10 NOTE — Assessment & Plan Note (Signed)
Colonoscopy scheduled for next month.

## 2021-03-10 NOTE — Progress Notes (Signed)
Pt's previsit is done over the phone and all paperwork (prep instructions, blank consent form to just read over) sent to patient and MyChart. Pt's name and DOB verified at the beginning of the previsit.  Pt denies any difficulty with ambulating.    Pt states she has never had to take a nitroglycerin   No trouble with anesthesia, denies being told they were difficult to intubate, or hx/fam hx of malignant hyperthermia per pt   No egg or soy allergy  No home oxygen use   No medications for weight loss taken  emmi information given  Pt denies constipation issues  Pt informed that we do not do prior authorizations for prep

## 2021-03-10 NOTE — Assessment & Plan Note (Signed)
CT of the pelvis is due, she is willing to have this done.  Bone lesion will be better characterized on this image.   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

## 2021-03-10 NOTE — Assessment & Plan Note (Signed)
Improved.  She is having some knee and ankle pain, but she would like to try and adjust some things at home prior to any work up.  Continue to monitor.

## 2021-03-10 NOTE — Progress Notes (Signed)
   Subjective:    Patient ID: Joy Contreras, female    DOB: [birth year], 45 y.o.   MRN: [id number]  CC: 6 month follow up for chronic health issues including GERD, elevated blood pressures  HPI  Ms. Joy Contreras is a 45 year old woman who presents for follow up.  Today, Joy Contreras reports that she is doing really well.  She has no complaints.  She has started using her CPAP, fatigue is still an issue.  She has lost 5 pounds.  BP was elevated today.  She notes only taking her Coreg as needed and a few times per week.  No headaches or lightheadedness.  She is due for blood work and an [place] of her neck for goiter.  All of these were discussed and she will get them scheduled.  She is due for colonoscopy which is scheduled in November.   Review of Systems  Constitutional:  Negative for activity change and appetite change.  Respiratory:  Negative for shortness of breath.   Cardiovascular:  Positive for palpitations (occasional). Negative for chest pain and leg swelling.  Genitourinary:  Negative for vaginal bleeding, vaginal discharge and vaginal pain.  Musculoskeletal:  Positive for arthralgias (left knee and ankle).  Neurological:  Negative for dizziness and weakness.      Objective:   Physical Exam Constitutional:      General: She is not in acute distress.    Appearance: Normal appearance. She is obese. She is not toxic-appearing.  HENT:     Head: Normocephalic and atraumatic.  Neck:     Thyroid: Thyromegaly present. No thyroid mass or thyroid tenderness.  Cardiovascular:     Rate and Rhythm: Normal rate and regular rhythm.     Heart sounds: No murmur heard.   No gallop.  Pulmonary:     Effort: Pulmonary effort is normal. No respiratory distress.  Abdominal:     General: Abdomen is flat. There is no distension.  Musculoskeletal:        General: Tenderness (left knee) present.     Cervical back: Neck supple. No erythema, signs of trauma, rigidity or tenderness. No pain with movement.   Lymphadenopathy:     Cervical: No cervical adenopathy.  Skin:    General: Skin is warm.     Coloration: Skin is not jaundiced or pale.  Neurological:     Mental Status: She is alert and oriented to person, place, and time. Mental status is at baseline.  Psychiatric:        Mood and Affect: Mood normal.        Behavior: Behavior normal.    Due for TFTs, CMET, CBC, vitamin D, Will check TPO antibodies      Assessment & Plan:  Return in 62m - 1 year as needed.

## 2021-03-10 NOTE — Assessment & Plan Note (Signed)
Chronic and controlled.  Probably playing a part in her pain and also fatigue.   Check vitamin D level today.

## 2021-03-10 NOTE — Assessment & Plan Note (Signed)
Chronic and controlled.  Only takes PPI occasionally.  Will take when needed.

## 2021-03-10 NOTE — Assessment & Plan Note (Signed)
She notes that the right lobe seems bigger.  No swallowing difficulties, no pain with palpation.  No fevers or chills. Her palpitations are controlled. She continues to have fatigue.    Plan Check TSH, fT4 (reflex to T3 if TSH low) and TPO antibodies Discussed an ultrasound to check for nodules which we will order today.

## 2021-03-10 NOTE — Assessment & Plan Note (Signed)
Currently with normal heart rate.  She takes her ivabradine daily.  She notes occasional fluttering for which she will start taking her Coreg again.    Plan Continue current therapy EKG for any changes in symptoms or irregularity.

## 2021-03-10 NOTE — Assessment & Plan Note (Signed)
She attempted to stop her estradiol patch, however, she had immediate hot flashes and felt horrible.  We discussed the recommendation to continue until she is 45 years old, or normal menopausal time.  She is amenable to this.   Plan Continue estradiol patch

## 2021-03-10 NOTE — Assessment & Plan Note (Signed)
She has successfully lost 5 pounds.  She is on her CPAP and trying to maintain a healthy diet.

## 2021-03-11 LAB — CBC
Hematocrit: 41.8 % (ref 34.0–46.6)
Hemoglobin: 14 g/dL (ref 11.1–15.9)
MCH: 28.6 pg (ref 26.6–33.0)
MCHC: 33.5 g/dL (ref 31.5–35.7)
MCV: 86 fL (ref 79–97)
Platelets: 221 10*3/uL (ref 150–450)
RBC: 4.89 x10E6/uL (ref 3.77–5.28)
RDW: 12.3 % (ref 11.7–15.4)
WBC: 5.3 10*3/uL (ref 3.4–10.8)

## 2021-03-11 LAB — CMP14 + ANION GAP
ALT: 23 IU/L (ref 0–32)
AST: 19 IU/L (ref 0–40)
Albumin/Globulin Ratio: 1.5 (ref 1.2–2.2)
Albumin: 4.3 g/dL (ref 3.8–4.8)
Alkaline Phosphatase: 95 IU/L (ref 44–121)
Anion Gap: 16 mmol/L (ref 10.0–18.0)
BUN/Creatinine Ratio: 17 (ref 9–23)
BUN: 11 mg/dL (ref 6–24)
Bilirubin Total: 0.3 mg/dL (ref 0.0–1.2)
CO2: 21 mmol/L (ref 20–29)
Calcium: 9.4 mg/dL (ref 8.7–10.2)
Chloride: 103 mmol/L (ref 96–106)
Creatinine, Ser: 0.64 mg/dL (ref 0.57–1.00)
Globulin, Total: 2.9 g/dL (ref 1.5–4.5)
Glucose: 84 mg/dL (ref 70–99)
Potassium: 4.4 mmol/L (ref 3.5–5.2)
Sodium: 140 mmol/L (ref 134–144)
Total Protein: 7.2 g/dL (ref 6.0–8.5)
eGFR: 111 mL/min/{1.73_m2} (ref >59)

## 2021-03-11 LAB — THYROID PEROXIDASE ANTIBODY: Thyroperoxidase Ab SerPl-aCnc: <8 IU/mL (ref 0–34)

## 2021-03-11 LAB — VITAMIN D 25 HYDROXY (VIT D DEFICIENCY, FRACTURES): Vit D, 25-Hydroxy: 30.4 ng/mL (ref 30.0–100.0)

## 2021-03-11 LAB — T4, FREE: Free T4: 1.07 ng/dL (ref 0.82–1.77)

## 2021-03-11 LAB — TSH: TSH: 0.689 u[IU]/mL (ref 0.450–4.500)

## 2021-03-20 ENCOUNTER — Other Ambulatory Visit (HOSPITAL_COMMUNITY): Payer: Self-pay

## 2021-03-24 ENCOUNTER — Other Ambulatory Visit: Payer: Self-pay

## 2021-03-24 ENCOUNTER — Encounter: Payer: Self-pay | Admitting: Gastroenterology

## 2021-03-24 ENCOUNTER — Ambulatory Visit (AMBULATORY_SURGERY_CENTER): Payer: 59 | Admitting: Gastroenterology

## 2021-03-24 VITALS — BP 140/89 | HR 69 | Temp 96.9°F | Resp 12 | Ht 64.0 in | Wt 200.0 lb

## 2021-03-24 DIAGNOSIS — G4730 Sleep apnea, unspecified: Secondary | ICD-10-CM | POA: Diagnosis not present

## 2021-03-24 DIAGNOSIS — Z1211 Encounter for screening for malignant neoplasm of colon: Secondary | ICD-10-CM | POA: Diagnosis not present

## 2021-03-24 DIAGNOSIS — Z8371 Family history of colonic polyps: Secondary | ICD-10-CM

## 2021-03-24 DIAGNOSIS — D120 Benign neoplasm of cecum: Secondary | ICD-10-CM | POA: Diagnosis not present

## 2021-03-24 DIAGNOSIS — I10 Essential (primary) hypertension: Secondary | ICD-10-CM | POA: Diagnosis not present

## 2021-03-24 MED ORDER — SODIUM CHLORIDE 0.9 % IV SOLN: 500.0000 mL | Freq: Once | INTRAVENOUS | Status: DC

## 2021-03-24 NOTE — Patient Instructions (Signed)
Handouts provided polyps and diverticulosis.   YOU HAD AN ENDOSCOPIC PROCEDURE TODAY AT [facility]:   Refer to the procedure report that was given to you for any specific questions about what was found during the examination.  If the procedure report does not answer your questions, please call your gastroenterologist to clarify.  If you requested that your care partner not be given the details of your procedure findings, then the procedure report has been included in a sealed envelope for you to review at your convenience later.  YOU SHOULD EXPECT: Some feelings of bloating in the abdomen. Passage of more gas than usual.  Walking can help get rid of the air that was put into your GI tract during the procedure and reduce the bloating. If you had a lower endoscopy (such as a colonoscopy or flexible sigmoidoscopy) you may notice spotting of blood in your stool or on the toilet paper. If you underwent a bowel prep for your procedure, you may not have a normal bowel movement for a few days.  Please Note:  You might notice some irritation and congestion in your nose or some drainage.  This is from the oxygen used during your procedure.  There is no need for concern and it should clear up in a day or so.  SYMPTOMS TO REPORT IMMEDIATELY:  Following lower endoscopy (colonoscopy or flexible sigmoidoscopy):  Excessive amounts of blood in the stool  Significant tenderness or worsening of abdominal pains  Swelling of the abdomen that is new, acute  Fever of 100F or higher  For urgent or emergent issues, a gastroenterologist can be reached at any hour by calling [phone]. Do not use MyChart messaging for urgent concerns.    DIET:  We do recommend a small meal at first, but then you may proceed to your regular diet.  Drink plenty of fluids but you should avoid alcoholic beverages for 24 hours.  ACTIVITY:  You should plan to take it easy for the rest of today and you should NOT DRIVE  or use heavy machinery until tomorrow (because of the sedation medicines used during the test).    FOLLOW UP: Our staff will call the number listed on your records 48-72 hours following your procedure to check on you and address any questions or concerns that you may have regarding the information given to you following your procedure. If we do not reach you, we will leave a message.  We will attempt to reach you two times.  During this call, we will ask if you have developed any symptoms of COVID 19. If you develop any symptoms (ie: fever, flu-like symptoms, shortness of breath, cough etc.) before then, please call [phone].  If you test positive for Covid 19 in the 2 weeks post procedure, please call and report this information to [place].    If any biopsies were taken you will be contacted by phone or by letter within the next 1-3 weeks.  Please call us at [phone] if you have not heard about the biopsies in 3 weeks.    SIGNATURES/CONFIDENTIALITY: You and/or your care partner have signed paperwork which will be entered into your electronic medical record.  These signatures attest to the fact that that the information above on your After Visit Summary has been reviewed and is understood.  Full responsibility of the confidentiality of this discharge information lies with you and/or your care-partner.

## 2021-03-24 NOTE — Progress Notes (Signed)
VS completed by Bernice.   Pt's states no medical or surgical changes since previsit or office visit.  

## 2021-03-24 NOTE — Progress Notes (Signed)
Referring Provider: Sid Falcon, MD Primary Care Physician:  Sid Falcon, MD  Reason for Procedure:  Colon cancer screening   IMPRESSION:  Need for colon cancer screening Appropriate candidate for monitored anesthesia care  PLAN: Colonoscopy in the [facility] today   HPI: Joy Contreras is a 45 y.o. female presents for screening colonoscopy.  No prior colonoscopy or colon cancer screening.  No baseline GI symptoms.   No known family history of colon cancer or polyps. No family history of uterine/endometrial cancer, pancreatic cancer or gastric/stomach cancer.   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 Dx with Autonomously functioning goiter during pregnancy. anti-TPO negative.    Hypertension    Menorrhagia    Being tx by GYN   Sleep apnea    wears CPAP   Tachycardia    Required BB during pregnancy. Baseline 100-130 at rest    Past Surgical History:  Procedure Laterality Date   BILATERAL SALPINGECTOMY Bilateral 11/01/2014   Procedure: BILATERAL SALPINGECTOMY;  Surgeon: Servando Salina, MD;  Location: [facility];  Service: Gynecology;  Laterality: Bilateral;   CESAREAN SECTION  11/22/2001   LEFT OOPHORECTOMY  2002   Dr Garwin Brothers. 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 180 tablet 3   estradiol (CLIMARA - DOSED IN MG/24 HR) 0.1 mg/24hr patch PLACE 1 PATCH (0.1 MG TOTAL) ONTO THE SKIN ONCE A WEEK. 12 patch 3   ivabradine (CORLANOR) 5 MG TABS tablet Take 1 tablet (5 mg total) by mouth 2 (two) times daily with a meal. 180 tablet 3   Multiple Vitamins-Minerals (HAIR SKIN AND NAILS FORMULA) TABS Take 1 tablet by mouth 2 (two) times daily.     nitroGLYCERIN (NITROSTAT) 0.4 MG SL tablet PLACE 1 TABLET (0.4 MG TOTAL) UNDER THE TONGUE EVERY 5 (FIVE) MINUTES AS NEEDED FOR CHEST PAIN. (Patient not taking: Reported on 03/10/2021) 25 tablet 3   pantoprazole (PROTONIX) 40 MG tablet Take 1 tablet (40 mg total) by mouth as needed. 90 tablet 3   Vitamin D, Ergocalciferol, (DRISDOL) 1.25 MG (50000 UNIT) CAPS capsule TAKE 1 CAPSULE (50,000 UNITS TOTAL) BY MOUTH EVERY 7 (SEVEN) DAYS. 8 capsule 0   Current Facility-Administered Medications  Medication Dose Route Frequency Provider Last Rate Last Admin   0.9 %  sodium chloride infusion  500 mL Intravenous Once Thornton Park, MD        Allergies as of 03/24/2021   (No Known Allergies)    Family History  Problem Relation Age of Onset  Hypertension Mother    Bipolar disorder Mother    Seizures Father    Stickler syndrome Sister    Mental illness Brother    Schizophrenia Brother    ADD / ADHD Son    Colon cancer Neg Hx    Esophageal cancer Neg Hx    Rectal cancer Neg Hx    Stomach cancer Neg Hx      Physical Exam: General:   Alert,  well-nourished, pleasant and cooperative in NAD Head:  Normocephalic and atraumatic. Eyes:  Sclera clear, no icterus.   Conjunctiva pink. Mouth:  No deformity or lesions.   Neck:  Supple; no masses or  thyromegaly. Lungs:  Clear throughout to auscultation.   No wheezes. Heart:  Regular rate and rhythm; no murmurs. Abdomen:  Soft, non-tender, nondistended, normal bowel sounds, no rebound or guarding.  Msk:  Symmetrical. No boney deformities LAD: No inguinal or umbilical LAD Extremities:  No clubbing or edema. Neurologic:  Alert and  oriented x4;  grossly nonfocal Skin:  No obvious rash or bruise. Psych:  Alert and cooperative. Normal mood and affect.       Aniyiah Zell L. Tarri Glenn, MD, MPH 03/24/2021, 9:12 AM

## 2021-03-24 NOTE — Progress Notes (Signed)
Vss nad transferred to pacu 

## 2021-03-24 NOTE — Op Note (Addendum)
[facility] Patient Name: Joy Contreras Procedure Date: 03/24/2021 9:47 AM MRN: [id number] Endoscopist: Thornton Park MD, MD Age: 45 Referring MD:  Date of Birth: 03/03/1976 Gender: Female Account #: [id number] Procedure:                Colonoscopy Indications:              Screening for colorectal malignant neoplasm, This                            is the patient's first colonoscopy                           Mother with colon polyps (tubular adenomas)                           No known family history of colon cancer Medicines:                Monitored Anesthesia Care Procedure:                Pre-Anesthesia Assessment:                           - Prior to the procedure, a History and Physical                            was performed, and patient medications and                            allergies were reviewed. The patient's tolerance of                            previous anesthesia was also reviewed. The risks                            and benefits of the procedure and the sedation                            options and risks were discussed with the patient.                            All questions were answered, and informed consent                            was obtained. Prior Anticoagulants: The patient has                            taken no previous anticoagulant or antiplatelet                            agents. ASA Grade Assessment: III - A patient with                            severe systemic disease. After reviewing the risks  and benefits, the patient was deemed in                            satisfactory condition to undergo the procedure.                           After obtaining informed consent, the colonoscope                            was passed under direct vision. Throughout the                            procedure, the patient's blood pressure, pulse, and                            oxygen saturations were monitored  continuously. The                            Olympus CF-[id number]) Colonoscope was                            introduced through the anus and advanced to the 3                            cm into the ileum. A second forward view of the                            right colon was performed. The colonoscopy was                            performed without difficulty. The patient tolerated                            the procedure well. The quality of the bowel                            preparation was good. The terminal ileum, ileocecal                            valve, appendiceal orifice, and rectum were                            photographed. Scope In: 9:55:33 AM Scope Out: 10:04:43 AM Scope Withdrawal Time: 0 hours 6 minutes 57 seconds  Total Procedure Duration: 0 hours 9 minutes 10 seconds  Findings:                 The perianal and digital rectal examinations were                            normal.                           A 7 mm polyp was found in the cecum. The polyp was  flat. The polyp was removed with a cold snare.                            Resection and retrieval were complete. Estimated                            blood loss was minimal.                           There were a few small diverticula in the sigmoid                            colon. The exam was otherwise without abnormality                            on direct and retroflexion views. Complications:            No immediate complications. Estimated blood loss:                            Minimal. Estimated Blood Loss:     Estimated blood loss was minimal. Impression:               - One 7 mm polyp in the cecum, removed with a cold                            snare. Resected and retrieved.                           - The examination was otherwise normal on direct                            and retroflexion views. Recommendation:           - Patient has a contact number available for                             emergencies. The signs and symptoms of potential                            delayed complications were discussed with the                            patient. Return to normal activities tomorrow.                            Written discharge instructions were provided to the                            patient.                           - Resume previous diet.                           - Continue present medications.                           -  Await pathology results.                           - Repeat colonoscopy date to be determined after                            pending pathology results are reviewed for                            surveillance.                           - Emerging evidence supports eating a diet of                            fruits, vegetables, grains, calcium, and yogurt                            while reducing red meat and alcohol may reduce the                            risk of colon cancer.                           - Given these results, all first degree relatives                            (brothers, sisters, children, parents) should start                            colon cancer screening at age 58.                           - Thank you for allowing me to be involved in your                            colon cancer prevention. Thornton Park MD, MD 03/24/2021 10:09:26 AM This report has been signed electronically.

## 2021-03-24 NOTE — Progress Notes (Signed)
Called to room to assist during endoscopic procedure.  Patient ID and intended procedure confirmed with present staff. Received instructions for my participation in the procedure from the performing physician.  

## 2021-03-28 ENCOUNTER — Telehealth: Payer: Self-pay | Admitting: *Deleted

## 2021-03-28 NOTE — Telephone Encounter (Signed)
  Follow up Call-  Call back number 03/24/2021  Post procedure Call Back phone  # [phone]  Permission to leave phone message Yes  Some recent data might be hidden     Patient questions:  Do you have a fever, pain , or abdominal swelling? No. Pain Score  0 *  Have you tolerated food without any problems? Yes.    Have you been able to return to your normal activities? Yes.    Do you have any questions about your discharge instructions: Diet   No. Medications  No. Follow up visit  No.  Do you have questions or concerns about your Care? No.  Actions: * If pain score is 4 or above: No action needed, pain <4.  Have you developed a fever since your procedure? no  2.   Have you had an respiratory symptoms (SOB or cough) since your procedure? no  3.   Have you tested positive for COVID 19 since your procedure no  4.   Have you had any family members/close contacts diagnosed with the COVID 19 since your procedure?  no   If yes to any of these questions please route to Joylene John, RN and Joella Prince, RN

## 2021-03-29 ENCOUNTER — Ambulatory Visit: Payer: 59 | Admitting: Neurology

## 2021-03-29 ENCOUNTER — Encounter: Payer: Self-pay | Admitting: Neurology

## 2021-03-29 VITALS — BP 146/87 | HR 73 | Ht 64.0 in | Wt 201.4 lb

## 2021-03-29 DIAGNOSIS — Z9989 Dependence on other enabling machines and devices: Secondary | ICD-10-CM

## 2021-03-29 DIAGNOSIS — G4733 Obstructive sleep apnea (adult) (pediatric): Secondary | ICD-10-CM | POA: Diagnosis not present

## 2021-03-29 NOTE — Progress Notes (Signed)
Subjective:    Patient ID: Joy Contreras is a 45 y.o. female.  HPI    Interim history:   Joy Contreras is a 45 year old right-handed woman with an underlying medical history of tachycardia, reflux disease, vitamin D deficiency, elevated blood pressure, hip pain, iron deficiency anemia, and obesity, who presents for follow-up consultation of her obstructive sleep apnea, after interim testing and starting AutoPap therapy.  The patient is unaccompanied today.  I first met her at the request of her primary care physician on 06/28/2020, at which time she reported snoring and excessive daytime somnolence as well as witnessed apneas and sleep disruption.  She was advised to proceed with a sleep study.  She had a baseline sleep study on 08/05/2020, which showed a sleep efficiency of 90.9%, sleep latency of 37 minutes, REM latency 61 minutes, she had fairly good sleep architecture.  She had a total AHI of 12.8/h, REM AHI in the severe range at 41.2/h, supine AHI 12.7/h, O2 nadir 74%.  She was advised to start AutoPap therapy especially given her severe REM related sleep apnea and REM related significant desaturations.  Her set up date was 01/06/2021.  She has an Dentist.  Today, 03/29/2021: I reviewed her AutoPap compliance data from 02/28/2021 through 03/29/2021, which is a total of 20 days, during which time she used her machine 21 days with percent use days greater than 4 hours at 60%, indicating slightly suboptimal compliance with an average usage of 5.6 hours, residual AHI at goal at 1.3/h, 95th percentile of pressure at 7.1 cm with a range of 6 to 12 cm, leak on the lower side with a 95th percentile at 6.2 L/min.  For the past 82 days she had a compliance percentage of 69% for days on treatment about 4 hours.  She reports doing fairly with the AutoPap, she has pressures at night and has to take the mask off in the middle of the night.  She had very good compliance in the month of September from  01/15/2021 through 02/13/2021, her compliance for more than 4 hours was 81%, AHI 1.4, pressure about the same and leak low with a 95th percentile at 4.4 L/min.  She has certainly fulfilled insurance criteria for compliance as well.  She reports adjusting well to treatment.  Sometimes she spends the night especially on the weekends with her mom at her place and does not always take the machine with her.  She is very motivated to continue with treatment and very motivated to work on weight loss and eventually hoping to not use her AutoPap.  She does endorse improvement in her daytime energy and mental/cognitive clarity during the day and feels that she has actually benefited from treatment and her family endorses that she no longer snores audibly.  She uses a nasal mask.  She does not actually use the humidifier in the machine currently.  She is aware of the supply change/replacement necessity including filters, headgear, nose mask and tubing as well as water chamber.  The patient's allergies, current medications, family history, past medical history, past social history, past surgical history and problem list were reviewed and updated as appropriate.   Previously:   06/28/20: (She) reports snoring and sleep disruption, nonrestorative sleep and difficulty maintaining sleep.  I reviewed your office note from 04/22/2020.  Her Epworth sleepiness score is 10 out of 24, fatigue severity score is 37 out of 63.  Her daughter and her husband have noted pauses in her breathing while she  is asleep and rarely, she has woken up with a sense of gasping for air.  She denies any recurrent morning headaches or night to night nocturia and is not aware of any family history of sleep apnea.  She reports weight gain over the past 3 to 4 years.  She is working on weight loss.  She is a non-smoker and drinks alcohol up to 2 drinks on the weekends.  She limits her caffeine to about 2 of coffee per day on average. She admits that she does  not always go to bed on time.  She is typically in bed around midnight or even later, rise time is around 6 AM.  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  She lives with her family including husband and teenage kids, ages 50 and 46. She does watch TV while in bed.  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 Dx with Autonomously functioning goiter during pregnancy. anti-TPO negative.    Hypertension    Menorrhagia    Being tx by GYN   Sleep apnea    wears CPAP   Tachycardia    Required BB during pregnancy. Baseline 100-130 at rest    Her Past Surgical History Is Significant For: Past Surgical History:  Procedure Laterality Date   BILATERAL SALPINGECTOMY Bilateral 11/01/2014   Procedure: BILATERAL SALPINGECTOMY;  Surgeon: Servando Salina, MD;  Location: [facility];  Service: Gynecology;  Laterality: Bilateral;   CESAREAN SECTION  11/22/2001   LEFT OOPHORECTOMY  2002   Dr Garwin Brothers. 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   ROBOTIC ASSISTED SALPINGO OOPHERECTOMY Right 12/27/2016   Procedure: ROBOTIC ASSISTED RIGHT OOPHORECTOMY AND PERITONEAL BIOPSY;  Surgeon: Janie Morning, MD;  Location: [facility];  Service: Gynecology;  Laterality: Right;   ROBOTIC ASSISTED TOTAL HYSTERECTOMY N/A 11/01/2014   Procedure: ROBOTIC ASSISTED TOTAL HYSTERECTOMY;  Surgeon: Servando Salina, MD;  Location: [facility];  Service: Gynecology;  Laterality: N/A;    Her Family History Is Significant For: Family History  Problem Relation Age of Onset    Hypertension Mother    Bipolar disorder Mother    Seizures Father    Stickler syndrome Sister    Mental illness Brother    Schizophrenia Brother    ADD / ADHD Son    Colon cancer Neg Hx    Esophageal cancer Neg Hx    Rectal cancer Neg Hx    Stomach cancer Neg Hx    Sleep apnea Neg Hx     Her Social History Is Significant For: Social History   Socioeconomic History   Marital status: Married    Spouse name: Not on file   Number of children: Not on file   Years of education: Not on file   Highest education level: Not on file  Occupational History   Not on file  Tobacco Use   Smoking status: Never   Smokeless tobacco: Never  Vaping Use   Vaping Use: Never used  Substance and Sexual Activity  Alcohol use: Yes    Alcohol/week: 7.0 standard drinks    Types: 7 Glasses of wine per week    Comment: occassionally    Drug use: No   Sexual activity: Not on file  Other Topics Concern   Not on file  Social History Narrative   Not on file   Social Determinants of Health   Financial Resource Strain: Not on file  Food Insecurity: Not on file  Transportation Needs: Not on file  Physical Activity: Not on file  Stress: Not on file  Social Connections: Not on file    Her Allergies Are:  No Known Allergies:   Her Current Medications Are:  Outpatient Encounter Medications as of 03/29/2021  Medication Sig   carvedilol (COREG) 12.5 MG tablet Take 1 tablet (12.5 mg total) by mouth 2 (two) times daily.   estradiol (CLIMARA - DOSED IN MG/24 HR) 0.1 mg/24hr patch PLACE 1 PATCH (0.1 MG TOTAL) ONTO THE SKIN ONCE A WEEK.   ivabradine (CORLANOR) 5 MG TABS tablet Take 1 tablet (5 mg total) by mouth 2 (two) times daily with a meal.   Multiple Vitamins-Minerals (HAIR SKIN AND NAILS FORMULA) TABS Take 1 tablet by mouth 2 (two) times daily.   pantoprazole (PROTONIX) 40 MG tablet Take 1 tablet (40 mg total) by mouth as needed.   nitroGLYCERIN (NITROSTAT) 0.4 MG SL tablet PLACE 1 TABLET (0.4  MG TOTAL) UNDER THE TONGUE EVERY 5 (FIVE) MINUTES AS NEEDED FOR CHEST PAIN. (Patient not taking: Reported on 03/10/2021)   Vitamin D, Ergocalciferol, (DRISDOL) 1.25 MG (50000 UNIT) CAPS capsule TAKE 1 CAPSULE (50,000 UNITS TOTAL) BY MOUTH EVERY 7 (SEVEN) DAYS.   No facility-administered encounter medications on file as of 03/29/2021.  :  Review of Systems:  Out of a complete 14 point review of systems, all are reviewed and negative with the exception of these symptoms as listed below:  Review of Systems  Neurological:        Pt is here for CPAP follow up . Pt states it been going ok. Pt states its hard to wear when she has her hot flashes so she takes mask off in the middle of the night .    Objective:  Neurological Exam  Physical Exam Physical Examination:   Vitals:   03/29/21 0833  BP: (!) 146/87  Pulse: 73    General Examination: The patient is a very pleasant 45 y.o. female in no acute distress. She appears well-developed and well-nourished and well groomed.   HEENT: Normocephalic, atraumatic, pupils are equal, round and reactive to light, extraocular tracking is good without limitation to gaze excursion or nystagmus noted.  Corrective eyeglasses in place.  Hearing is grossly intact. Face is symmetric with normal facial animation. Speech is clear with no dysarthria noted. There is no hypophonia. There is no lip, neck/head, jaw or voice tremor. Neck is supple with full range of passive and active motion. There are no carotid bruits on auscultation. Oropharynx exam reveals: No significant mouth dryness, good dental hygiene and mild airway crowding.  Tongue protrudes centrally and palate elevates symmetrically.   Chest: Clear to auscultation without wheezing, rhonchi or crackles noted.   Heart: S1+S2+0, regular and normal without murmurs, rubs or gallops noted.    Abdomen: Soft, non-tender and non-distended.   Extremities: There is no obvious edema in the distal lower extremities  bilaterally.    Skin: Warm and dry without trophic changes noted.    Musculoskeletal: exam reveals no obvious joint deformities.  Neurologically:  Mental status: The patient is awake, alert and oriented in all 4 spheres. Her immediate and remote memory, attention, language skills and fund of knowledge are appropriate. There is no evidence of aphasia, agnosia, apraxia or anomia. Speech is clear with normal prosody and enunciation. Thought process is linear. Mood is normal and affect is normal.  Cranial nerves II - XII are as described above under HEENT exam.  Motor exam: Normal bulk, strength and tone is noted. There is no tremor, fine motor skills and coordination: grossly intact.  Cerebellar testing: No dysmetria or intention tremor. There is no truncal or gait ataxia.  Sensory exam: intact to light touch in the upper and lower extremities.  Gait, station and balance: She stands easily. No veering to one side is noted. No leaning to one side is noted. Posture is age-appropriate and stance is narrow based. Gait shows normal stride length and normal pace. No problems turning are noted.   Assessment and Plan:  In summary, Joy Contreras is a very pleasant 45 year old female with an underlying medical history of tachycardia, reflux disease, vitamin D deficiency, elevated blood pressure, hip pain, iron deficiency anemia, and obesity, who presents for follow-up consultation of her obstructive sleep apnea.  Her baseline sleep study from 08/05/2020 showed overall mild obstructive sleep apnea with a total AHI of 12.8/h but she did have significant desaturations during REM sleep and REM AHI was elevated at 41.2/h, O2 nadir was 74% for the night.  She has established treatment with AutoPap therapy since late August 2022.  She is compliant with treatment, she had great compliance in the month of September for example but lately has had some lapses in treatment.  She does spend some time over the weekends  with her mom at her place and does not always take her machine with her.  She also had a colonoscopy recently and did not use her machine around that time.  She has benefited from treatment.  She is commended for her treatment adherence and reminded to be consistent with it.  She is working on weight loss.  At this juncture, she can follow-up routinely in 1 year to see one of our nurse practitioners.  If she has achieved a desired amount of weight loss at the time, we can certainly think about rechecking her sleep apnea with a home sleep test at that time.  We will pick up our discussion at the next visit.  She is motivated to continue with treatment at this point.  We talked about her sleep study results and reviewed her compliance data in detail today.  I answered all her questions today and she was in agreement with our plan.  I spent 30 minutes in total face-to-face time and in reviewing records during pre-charting, more than 50% of which was spent in counseling and coordination of care, reviewing test results, reviewing medications and treatment regimen and/or in discussing or reviewing the diagnosis of OSA, the prognosis and treatment options. Pertinent laboratory and imaging test results that were available during this visit with the patient were reviewed by me and considered in my medical decision making (see chart for details).

## 2021-03-31 ENCOUNTER — Ambulatory Visit (HOSPITAL_COMMUNITY): Payer: 59

## 2021-04-02 ENCOUNTER — Encounter: Payer: Self-pay | Admitting: Gastroenterology

## 2021-04-07 DIAGNOSIS — G4733 Obstructive sleep apnea (adult) (pediatric): Secondary | ICD-10-CM | POA: Diagnosis not present

## 2021-04-08 DIAGNOSIS — G4733 Obstructive sleep apnea (adult) (pediatric): Secondary | ICD-10-CM | POA: Diagnosis not present

## 2021-04-14 ENCOUNTER — Ambulatory Visit (HOSPITAL_COMMUNITY)
Admission: RE | Admit: 2021-04-14 | Discharge: 2021-04-14 | Disposition: A | Discharge status: Home / Self Care | Payer: 59 | Source: Ambulatory Visit | Attending: Internal Medicine | Admitting: Internal Medicine

## 2021-04-14 ENCOUNTER — Other Ambulatory Visit: Payer: Self-pay

## 2021-04-14 DIAGNOSIS — E049 Nontoxic goiter, unspecified: Secondary | ICD-10-CM | POA: Diagnosis not present

## 2021-04-14 DIAGNOSIS — M899 Disorder of bone, unspecified: Secondary | ICD-10-CM | POA: Diagnosis not present

## 2021-04-14 DIAGNOSIS — Z90710 Acquired absence of both cervix and uterus: Secondary | ICD-10-CM | POA: Diagnosis not present

## 2021-04-14 DIAGNOSIS — M533 Sacrococcygeal disorders, not elsewhere classified: Secondary | ICD-10-CM | POA: Diagnosis not present

## 2021-04-14 IMAGING — US US THYROID
1 series · 14 of 25 positions shown · non-contrast
Comparison: None.

CLINICAL DATA: Goiter.

EXAM:
THYROID ULTRASOUND
TECHNIQUE: Ultrasound examination of the thyroid gland and adjacent soft
tissues was performed.

[Series 1: us thyroid · 14 of 33 slices shown]
[im 1/33]
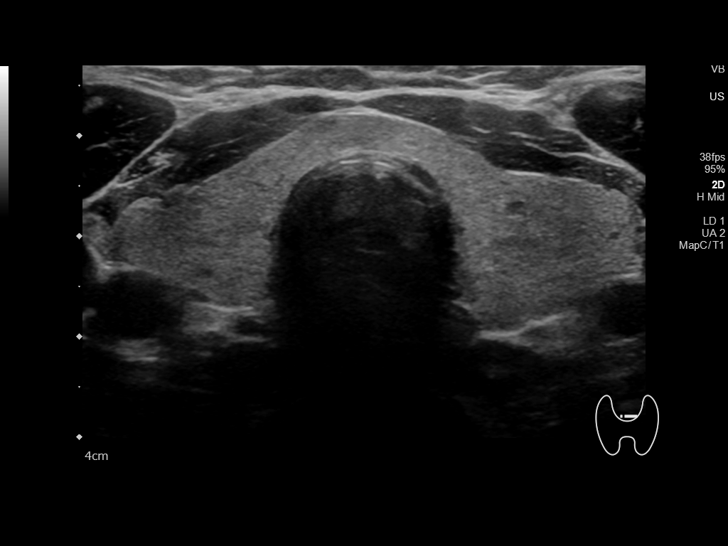
[im 3/33]
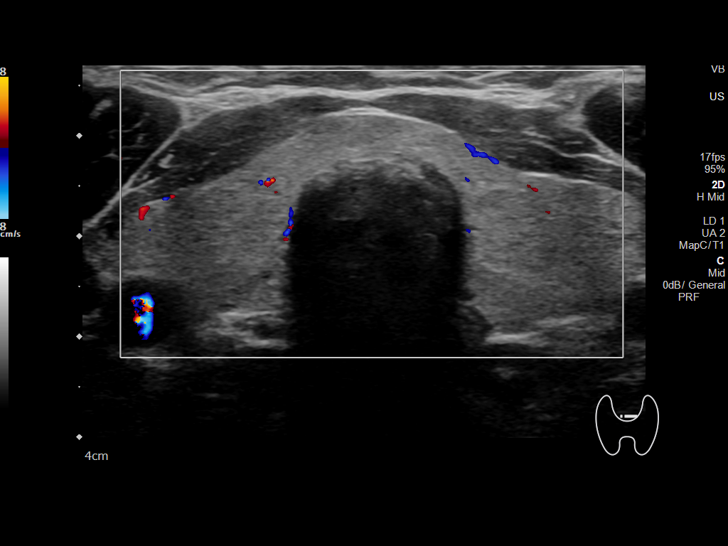
[im 6/33]
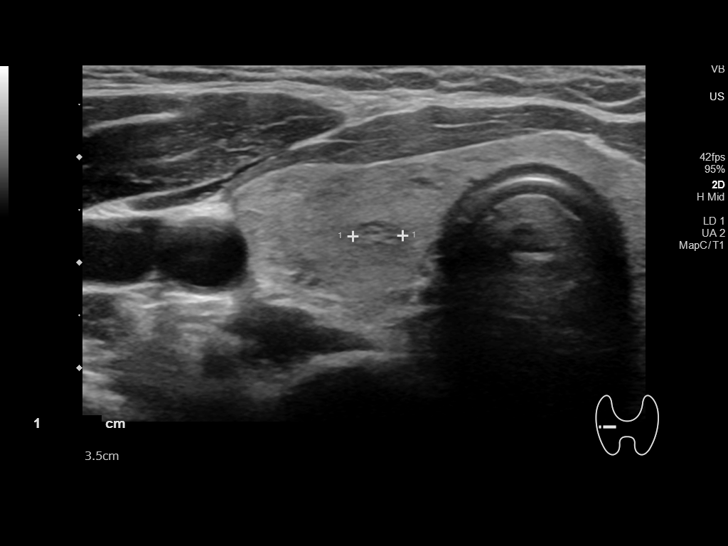
[im 9/33]
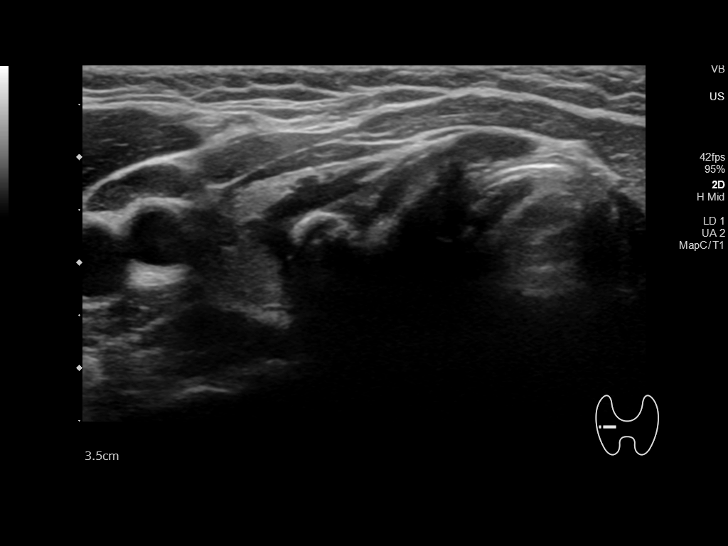
[im 11/33]
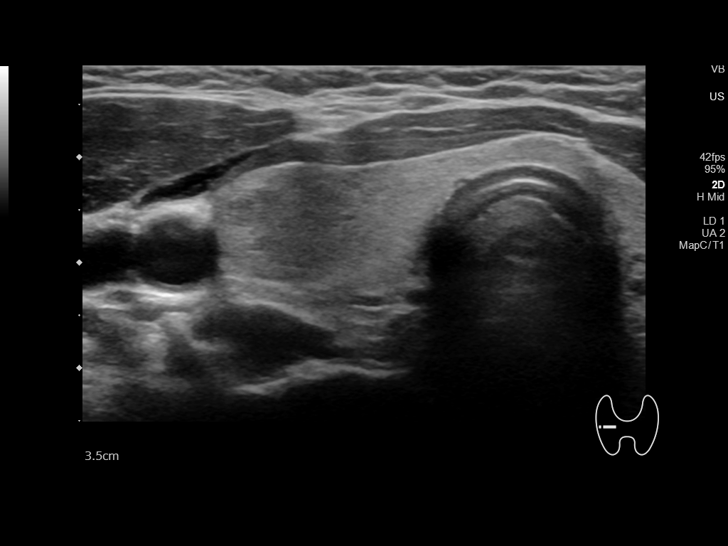
[im 13/33]
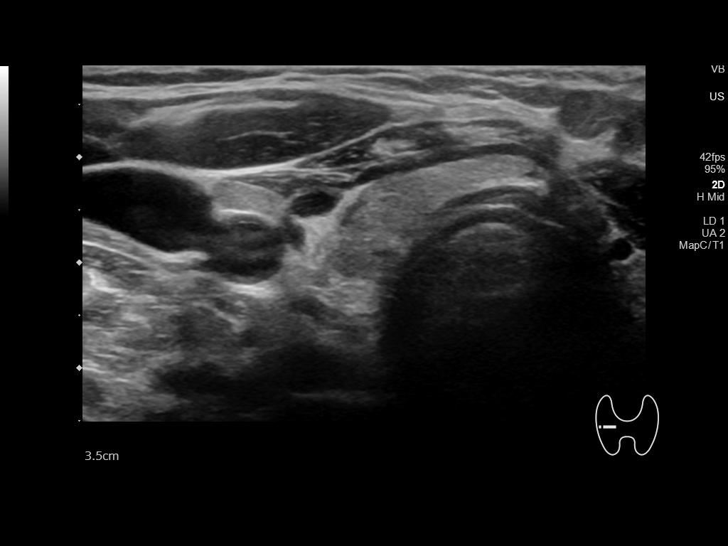
[im 15/33]
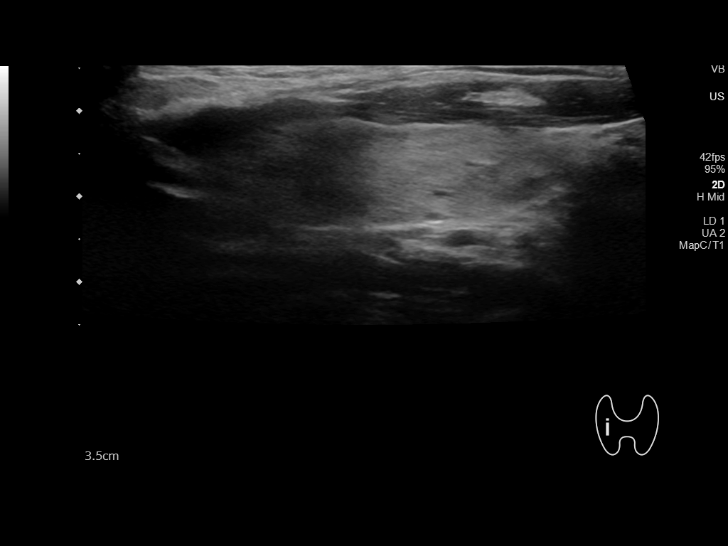
[im 18/33]
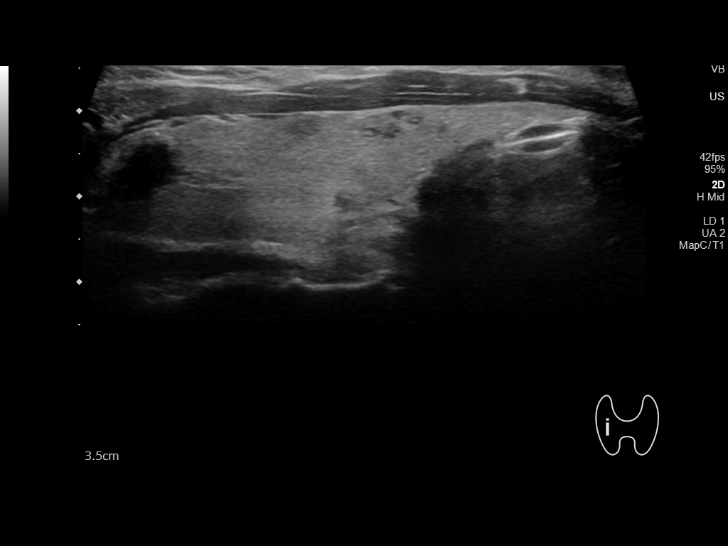
[im 21/33]
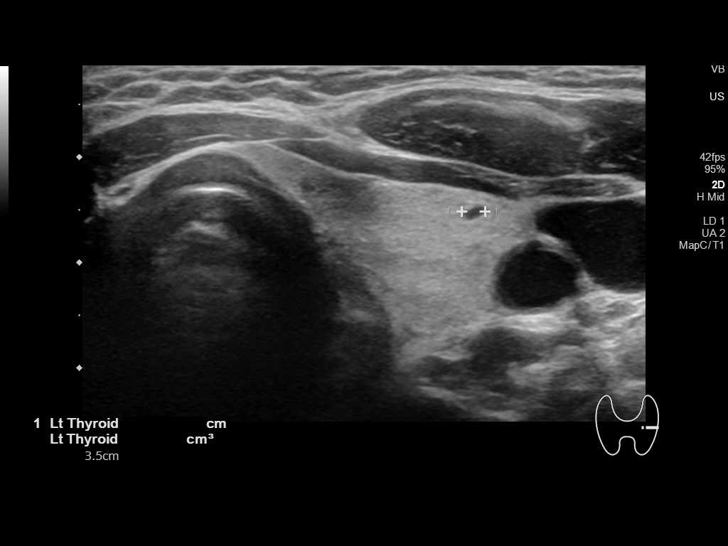
[im 22/33]
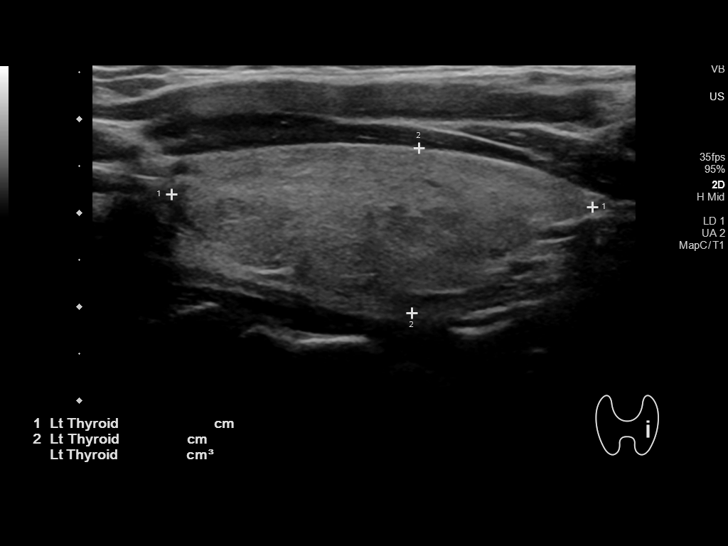
[im 25/33]
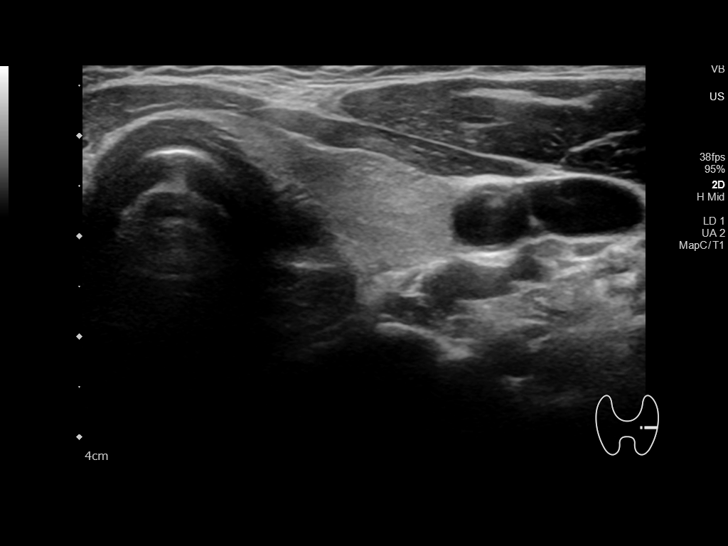
[im 27/33]
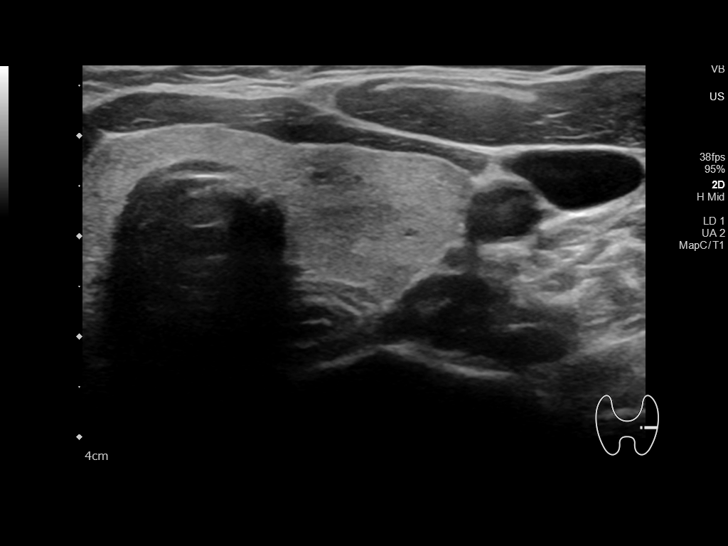
[im 30/33]
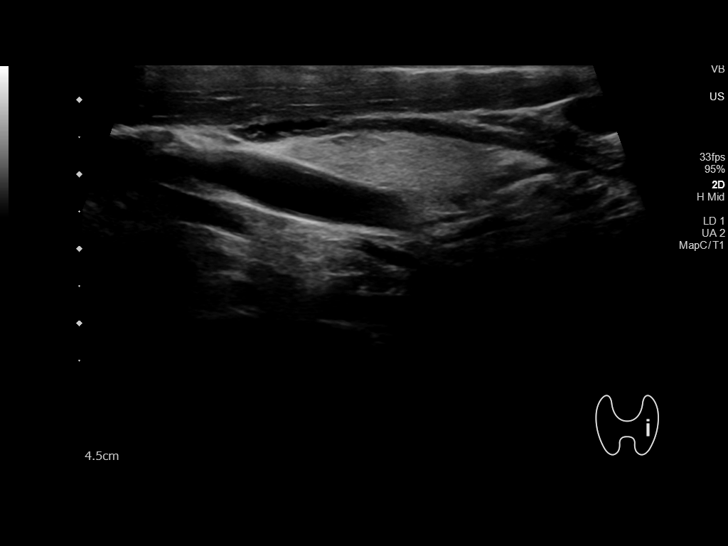
[im 33/33]
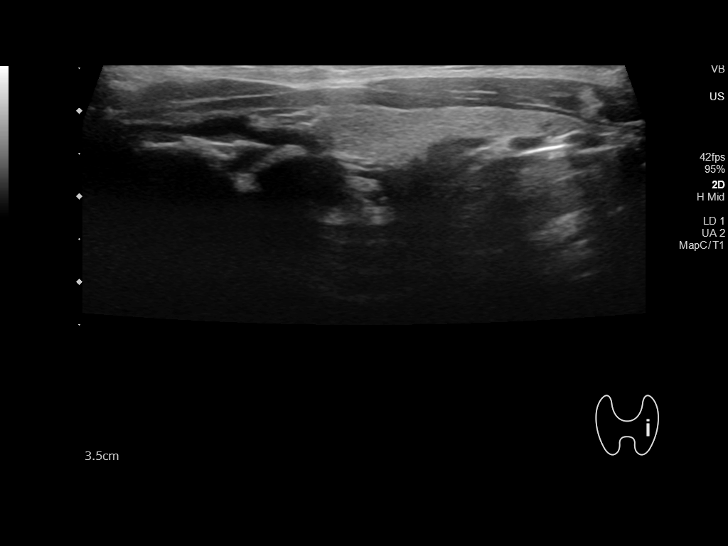

[14 of 25 positions shown; findings below may reference images not displayed]

FINDINGS: Parenchymal Echotexture: Mildly heterogenous

Isthmus: 0.4 cm

Right lobe: 5.1 x 1.6 x 1.9 cm

Left lobe: 4.5 x 1.8 x 2.0 cm

_________________________________________________________

Estimated total number of nodules >/= 1 cm: 0

Number of spongiform nodules >/=  2 cm not described below (TR1): 0

Number of mixed cystic and solid nodules >/= 1.5 cm not described
below (TR2): 0

_________________________________________________________

No discrete nodules are seen within the thyroid gland.
IMPRESSION: Mildly heterogeneous thyroid gland with slight enlargement of the
right thyroid lobe relative to the left.

No discrete thyroid nodules.

## 2021-04-14 IMAGING — CT CT PELVIS W/O CM
2 of 6 series · 14 of 46 positions shown, 16 images · non-contrast
Comparison: MRI [DATE], [DATE].  X-ray [DATE]

CLINICAL DATA: Follow-up left inferior pubic ramus bone lesion

EXAM:
CT PELVIS WITHOUT CONTRAST
TECHNIQUE: Multidetector CT imaging of the pelvis was performed following the
standard protocol without intravenous contrast.

[Series 5: coronal st · coronal · 0.50mm/px · 3 of 129 slices shown]
[im 43/129  soft-tissue]
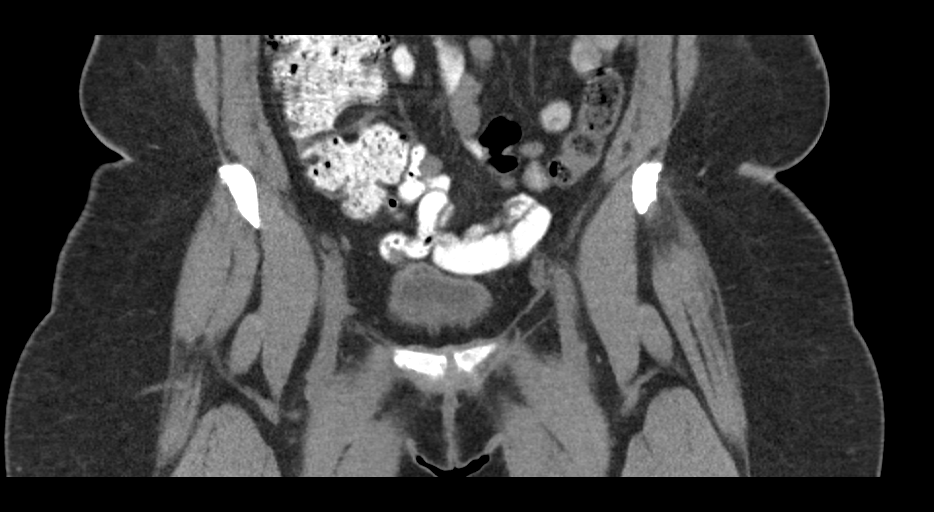
[im 65/129  soft-tissue]
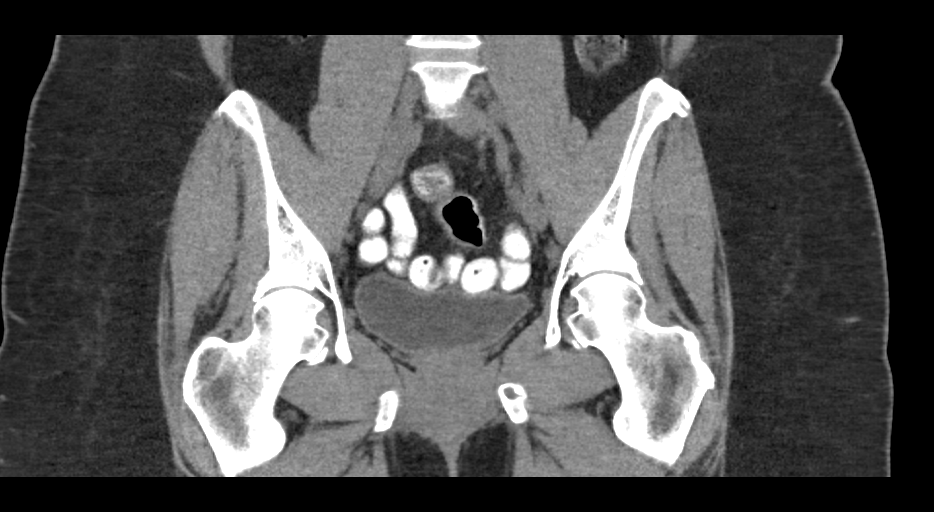
[im 86/129  soft-tissue]
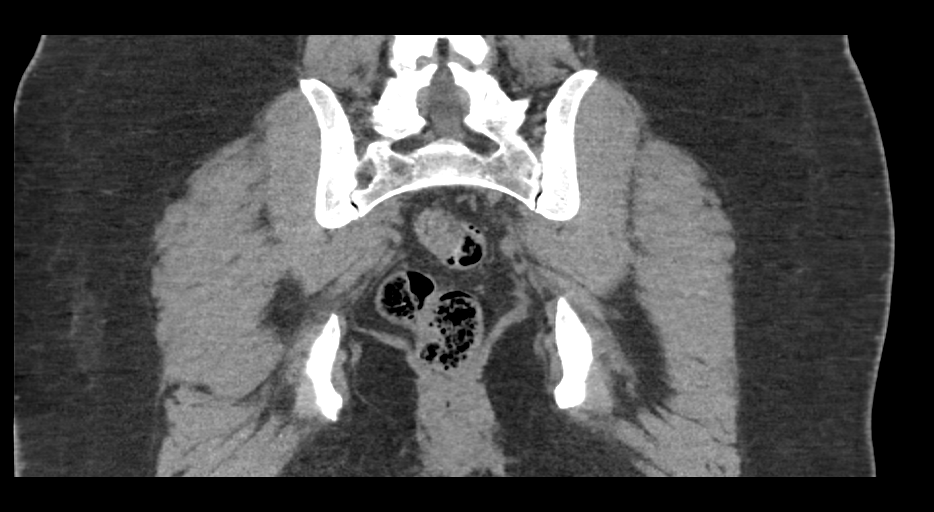

[Series 9: axial st · axial · 0.79mm/px · z∈[+1250,+1440]mm · 11 of 111 slices shown, 13 images]
[im 8/111  soft-tissue]
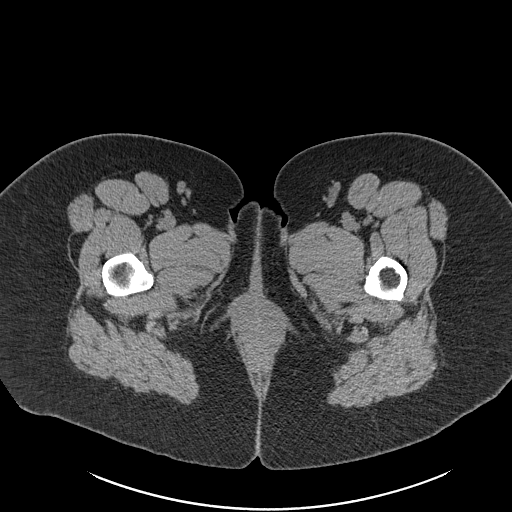
[im 8/111  bone]
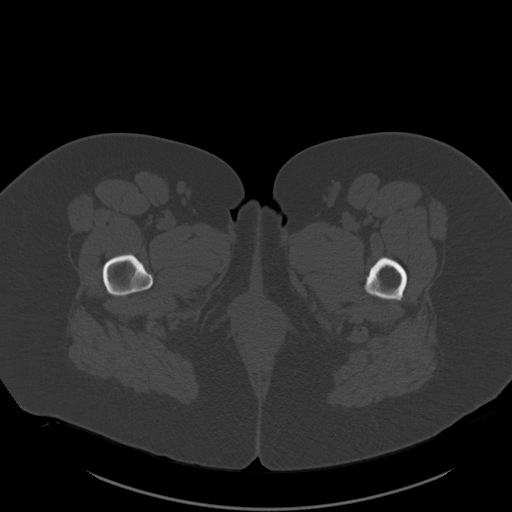
[im 15/111  soft-tissue]
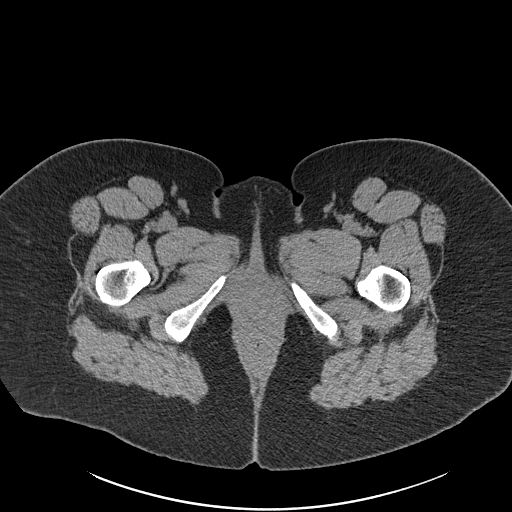
[im 30/111  soft-tissue]
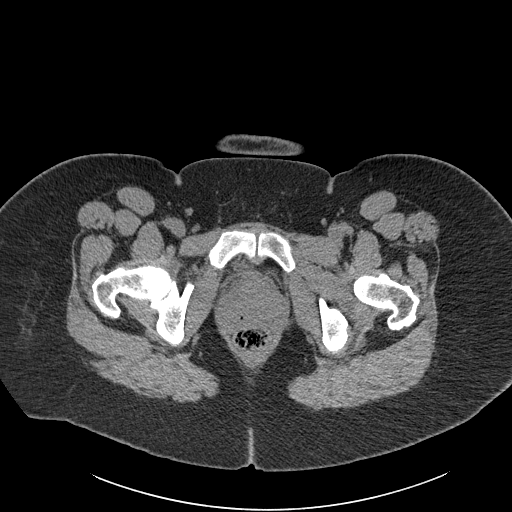
[im 37/111  soft-tissue]
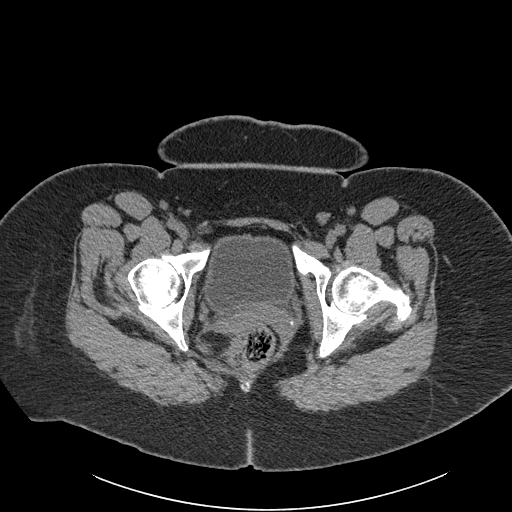
[im 45/111  soft-tissue]
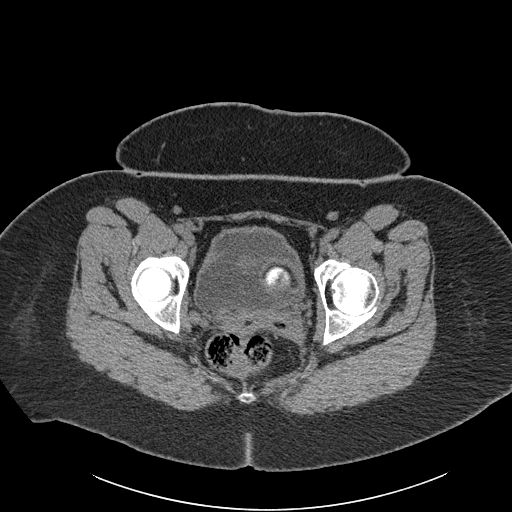
[im 59/111  soft-tissue]
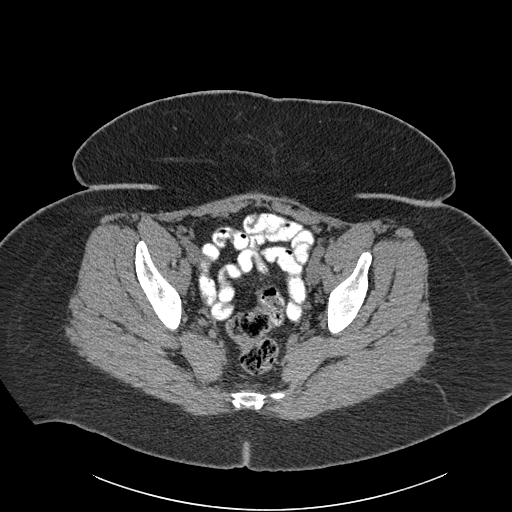
[im 67/111  soft-tissue]
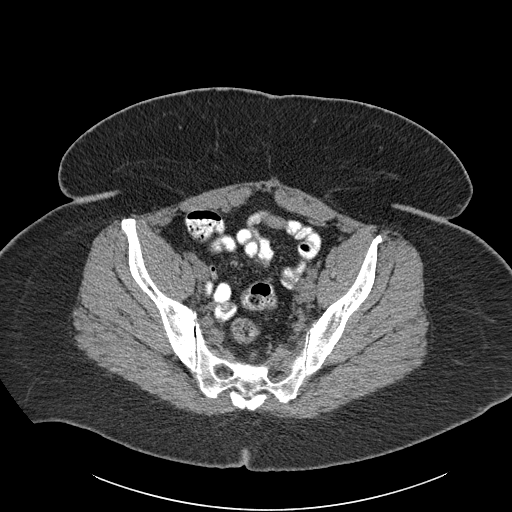
[im 74/111  soft-tissue]
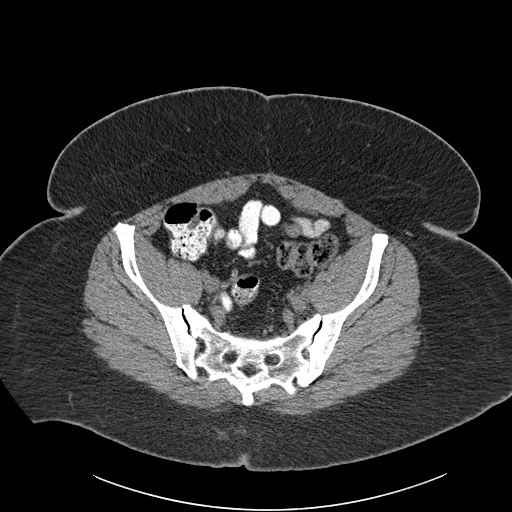
[im 81/111  soft-tissue]
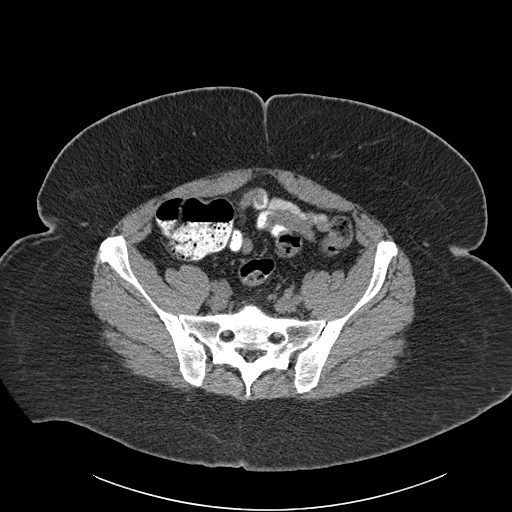
[im 81/111  bone]
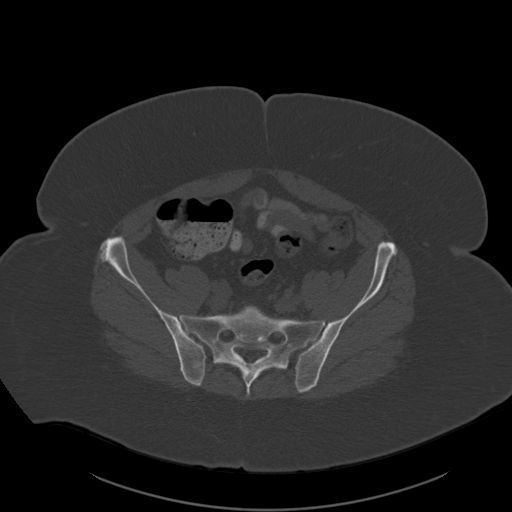
[im 96/111  soft-tissue]
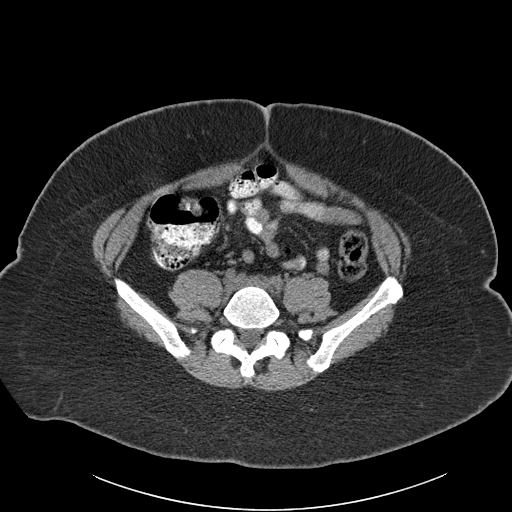
[im 103/111  soft-tissue]
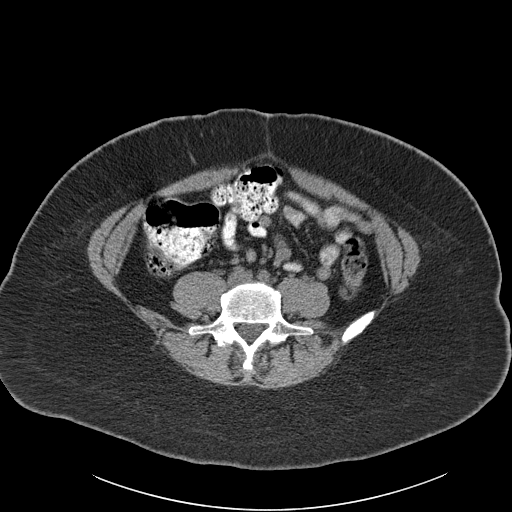

[14 of 46 positions shown; findings below may reference images not displayed]

FINDINGS: Urinary Tract:  No abnormality visualized.

Bowel: Unremarkable visualized pelvic bowel loops. A normal appendix
is noted.

Vascular/Lymphatic: No pelvic or inguinal lymphadenopathy. No acute
vascular abnormality.

Reproductive:  Surgically absent uterus.  No adnexal masses.

Other:  No free fluid within the pelvis.  No hernias are seen.

Musculoskeletal: Redemonstrated mildly expansile lucent and
sclerotic bone lesion centered within the midportion of the left
inferior pubic ramus measuring approximately 2.7 x 1.0 x 1.8 cm
(series 2, image 91; series 3, image 27). No pathologic fracture. No
evidence of extraosseous soft tissue component. No additional lytic
or sclerotic bone lesions involving the pelvis or bilateral proximal
femur.

No acute bony findings. Hip joint spaces are preserved. Mild
degenerative changes of the bilateral SI joints and pubic symphysis.
No soft tissue edema or fluid collection. No evidence of soft tissue
mass.
IMPRESSION: 1. Redemonstrated mildly expansile lucent and sclerotic bone lesion
centered within the midportion of the left inferior pubic ramus
measuring up to 2.7 cm. No pathologic fracture. No evidence of
extraosseous soft tissue component. This appears stable in size and
appearance compared to the previous exams, when accounting for
differences in technique. Findings remain most suggestive of an
intraosseous vascular malformation.
2. No additional lytic or sclerotic bone lesions involving the
pelvis or bilateral proximal femur.
3. Mild degenerative changes of the bilateral SI joints and pubic
symphysis.

## 2021-05-12 DIAGNOSIS — H5213 Myopia, bilateral: Secondary | ICD-10-CM | POA: Diagnosis not present

## 2021-07-10 ENCOUNTER — Other Ambulatory Visit (HOSPITAL_COMMUNITY): Payer: Self-pay

## 2021-07-10 ENCOUNTER — Encounter: Payer: Self-pay | Admitting: Podiatry

## 2021-07-10 ENCOUNTER — Ambulatory Visit (INDEPENDENT_AMBULATORY_CARE_PROVIDER_SITE_OTHER): Payer: 59

## 2021-07-10 ENCOUNTER — Other Ambulatory Visit: Payer: Self-pay

## 2021-07-10 ENCOUNTER — Ambulatory Visit: Payer: 59 | Admitting: Podiatry

## 2021-07-10 DIAGNOSIS — M79672 Pain in left foot: Secondary | ICD-10-CM | POA: Diagnosis not present

## 2021-07-10 DIAGNOSIS — G8929 Other chronic pain: Secondary | ICD-10-CM

## 2021-07-10 DIAGNOSIS — M7750 Other enthesopathy of unspecified foot: Secondary | ICD-10-CM

## 2021-07-10 DIAGNOSIS — M722 Plantar fascial fibromatosis: Secondary | ICD-10-CM | POA: Diagnosis not present

## 2021-07-10 DIAGNOSIS — M7751 Other enthesopathy of right foot: Secondary | ICD-10-CM | POA: Diagnosis not present

## 2021-07-10 DIAGNOSIS — M7671 Peroneal tendinitis, right leg: Secondary | ICD-10-CM

## 2021-07-10 MED ORDER — TRIAMCINOLONE ACETONIDE 10 MG/ML IJ SUSP
10.0000 mg | Freq: Once | INTRAMUSCULAR | Status: AC
  Administered 2021-07-10: 10 mg

## 2021-07-10 MED ORDER — DICLOFENAC SODIUM 75 MG PO TBEC
75.0000 mg | DELAYED_RELEASE_TABLET | Freq: Two times a day (BID) | ORAL | 2 refills | Status: DC
Start: 2021-07-10 — End: 2023-09-11
  Filled 2021-07-10: qty 50, 25d supply, fill #0
  Filled 2021-11-13: qty 50, 25d supply, fill #1

## 2021-07-11 ENCOUNTER — Other Ambulatory Visit: Payer: Self-pay | Admitting: Nurse Practitioner

## 2021-07-11 ENCOUNTER — Other Ambulatory Visit: Payer: Self-pay | Admitting: Internal Medicine

## 2021-07-11 DIAGNOSIS — Z780 Asymptomatic menopausal state: Secondary | ICD-10-CM

## 2021-07-11 NOTE — Progress Notes (Signed)
Subjective:   Patient ID: Joy Contreras, female   DOB: 46 y.o.   MRN: [id number]   HPI Patient presents stating that the bottom of her left heel has started to really hurt again and it did not respond as well to my partner injections and it is worse when she gets up in the morning after sitting and she is started develop pain in the outside of the right foot neuro   ROS      Objective:  Physical Exam  Neurovascular status intact with inflammation pain of the plantar aspect left heel with fluid buildup and mild discomfort of the outside of the right foot along the peroneal insertion     Assessment:  Acute plantar fasciitis left moderate compensatory peroneal tendinitis right     Plan:  H&P reviewed both conditions and I am going to focus on the left heel.  I did sterile prep and injected the fascia 3 mg Kenalog 5 mg Xylocaine and applied night splint that I want her to use to try to sleep bed and utilize ice with it to try to support the plantar foot.  Patient will be seen back for [place] to recheck encouraged to call with questions concerns and may require treatment for the right foot and will be seen back 3 weeks  X-rays taken today did reveal some irritation around the fifth metatarsal base right but no indications of bony injury with moderate spur formation no indications of stress fracture arthritis

## 2021-07-12 ENCOUNTER — Other Ambulatory Visit (HOSPITAL_COMMUNITY): Payer: Self-pay

## 2021-07-12 ENCOUNTER — Other Ambulatory Visit: Payer: Self-pay | Admitting: Internal Medicine

## 2021-07-12 DIAGNOSIS — Z780 Asymptomatic menopausal state: Secondary | ICD-10-CM

## 2021-07-12 DIAGNOSIS — G4733 Obstructive sleep apnea (adult) (pediatric): Secondary | ICD-10-CM | POA: Diagnosis not present

## 2021-07-12 MED ORDER — IVABRADINE HCL 5 MG PO TABS
5.0000 mg | ORAL_TABLET | Freq: Two times a day (BID) | ORAL | 0 refills | Status: DC
  Filled 2021-07-12 – 2021-11-13 (×2): qty 30, 15d supply, fill #0

## 2021-07-12 MED ORDER — CARESTART COVID-19 HOME TEST VI KIT
PACK | 0 refills | Status: DC
  Filled 2021-07-12: qty 4, 4d supply, fill #0

## 2021-07-12 MED ORDER — ESTRADIOL 0.1 MG/24HR TD PTWK
0.1000 mg | MEDICATED_PATCH | TRANSDERMAL | 3 refills | Status: DC
  Filled 2021-07-12: qty 12, 84d supply, fill #0
  Filled 2021-11-13 – 2021-12-29 (×2): qty 12, 84d supply, fill #1

## 2021-07-14 ENCOUNTER — Other Ambulatory Visit (HOSPITAL_COMMUNITY): Payer: Self-pay

## 2021-07-28 ENCOUNTER — Ambulatory Visit: Payer: 59 | Admitting: Podiatry

## 2021-08-11 ENCOUNTER — Encounter: Payer: 59 | Admitting: Internal Medicine

## 2021-09-13 ENCOUNTER — Encounter: Payer: 59 | Admitting: Internal Medicine

## 2021-11-03 ENCOUNTER — Encounter: Payer: 59 | Admitting: Internal Medicine

## 2021-11-13 ENCOUNTER — Other Ambulatory Visit (HOSPITAL_COMMUNITY): Payer: Self-pay

## 2021-11-21 ENCOUNTER — Other Ambulatory Visit (HOSPITAL_COMMUNITY): Payer: Self-pay

## 2021-12-29 ENCOUNTER — Ambulatory Visit: Payer: 59 | Admitting: Family Medicine

## 2021-12-29 ENCOUNTER — Other Ambulatory Visit (HOSPITAL_COMMUNITY): Payer: Self-pay

## 2021-12-29 VITALS — BP 163/92 | Ht 63.5 in | Wt 204.0 lb

## 2021-12-29 DIAGNOSIS — R1032 Left lower quadrant pain: Secondary | ICD-10-CM

## 2021-12-29 NOTE — Patient Instructions (Signed)
You likely have a strain of your quad/abductor muscle groups That should get better with time, rest, and some stretching/exercises Do these exercises most days of the week Take Tylenol and ibuprofen as needed for pain Follow-up with [place] as needed if this does not get better

## 2021-12-29 NOTE — Progress Notes (Signed)
  Joy Contreras - 46 y.o. female MRN [id number]  Date of birth: 1975/09/26    CHIEF COMPLAINT:   L groin pain    SUBJECTIVE:   HPI:  Patient presents for evaluation of left groin pain.  She describes a tightness and pulling in the proximal medial aspect of the left lower extremity.  This been present for about 1 week.  It is intermittent in nature.  It is made worse with side bending of the trunk to the right side.  She says this feels different from her last hip pain that was present about a year ago that has since resolved.  This is more distal in the groin.  She denies any trauma, injury, or activity change recently.  She has been taking Tylenol and ibuprofen which provides her some relief.  She has not tried any ice or heat.  She denies any radiation of the pain at the hip, down the back of the leg or into the knee.  ROS:     See HPI  PERTINENT  PMH / PSH FH / / SH:  Past Medical, Surgical, Social, and Family History Reviewed & Updated in the EMR.  Pertinent findings include:  none  OBJECTIVE: LMP 02/02/2013   Physical Exam:  Vital signs are reviewed.  GEN: Alert and oriented, NAD Pulm: Breathing unlabored PSY: normal mood, congruent affect  MSK: LLE -no overlying erythema.  Nontender to palpation at ASIS, AIIS, greater trochanter, ischial tuberosity.  Tender to palpation of medial groin.  Full range of motion in hip flexion and extension.  No pain with resisted hip flexion, abduction or adduction.  Negative logroll test.  Negative Stinchfield test.  Negative Faber test.  Negative FADIR test.  Symmetric strength in bilateral lower extremities.  Neurovascular intact distally.  ASSESSMENT & PLAN:  1. L Groin Pain -History exam is consistent with a mild strain of left hip flexors/adductors.  Reassurance was provided.  She was given home exercises to perform several days per week.  She can continue Tylenol and ibuprofen as needed for pain.  She will follow-up as needed.  If not  better at future date I recommend formal physical therapy.  Dortha Kern, MD PGY-4, Sports Medicine Fellow [place]

## 2022-01-01 ENCOUNTER — Encounter: Payer: Self-pay | Admitting: Family Medicine

## 2022-03-13 ENCOUNTER — Other Ambulatory Visit (HOSPITAL_COMMUNITY): Payer: Self-pay

## 2022-03-13 MED ORDER — CEPHALEXIN 500 MG PO CAPS
500.0000 mg | ORAL_CAPSULE | Freq: Three times a day (TID) | ORAL | 0 refills | Status: DC
  Filled 2022-03-13: qty 21, 7d supply, fill #0

## 2022-03-14 ENCOUNTER — Other Ambulatory Visit (HOSPITAL_COMMUNITY): Payer: Self-pay

## 2022-03-14 ENCOUNTER — Telehealth: Payer: Self-pay | Admitting: *Deleted

## 2022-03-14 DIAGNOSIS — G4733 Obstructive sleep apnea (adult) (pediatric): Secondary | ICD-10-CM | POA: Diagnosis not present

## 2022-03-14 MED ORDER — FLUCONAZOLE 150 MG PO TABS
ORAL_TABLET | ORAL | 0 refills | Status: DC
Start: 2022-03-14 — End: 2022-04-04
  Filled 2022-03-14: qty 2, 3d supply, fill #0

## 2022-03-14 NOTE — Telephone Encounter (Signed)
Thank you!  I spoke with Ulis Rias about this yesterday.  Ordered.  Gilles Chiquito, MD

## 2022-03-14 NOTE — Telephone Encounter (Signed)
Joy Contreras stated she's being prescribed Keflex per her dentist and usually develop a yeast infection - she's requesting abx. Thanks

## 2022-03-29 ENCOUNTER — Ambulatory Visit: Payer: 59 | Admitting: Neurology

## 2022-03-30 ENCOUNTER — Encounter: Payer: 59 | Admitting: Internal Medicine

## 2022-03-31 DIAGNOSIS — Z1231 Encounter for screening mammogram for malignant neoplasm of breast: Secondary | ICD-10-CM | POA: Diagnosis not present

## 2022-04-03 NOTE — Progress Notes (Unsigned)
Office Visit    Patient Name: Joy Contreras Date of Encounter: 04/04/2022  Primary Care Provider:  Sid Falcon, MD Primary Cardiologist:  None Primary Electrophysiologist: None  Chief Complaint    Joy Contreras is a 46 y.o. female with PMH of nonobstructive CAD, tachycardia, GERD, OSA (on CPAP), IDA who presents today for annual follow-up.  Past Medical History    Past Medical History:  Diagnosis Date   Blood transfusion without reported diagnosis    Depression    History of Depression   Dysrhythmia    tachycardia   GERD (gastroesophageal reflux disease)    Tx with PPI   Goiter 05/14/2004   Dr Altheimer. Dx with Autonomously functioning goiter during pregnancy. anti-TPO negative.    Hypertension    Menorrhagia    Being tx by GYN   Sleep apnea    wears CPAP   Tachycardia    Required BB during pregnancy. Baseline 100-130 at rest   Past Surgical History:  Procedure Laterality Date   BILATERAL SALPINGECTOMY Bilateral 11/01/2014   Procedure: BILATERAL SALPINGECTOMY;  Surgeon: Servando Salina, MD;  Location: [facility];  Service: Gynecology;  Laterality: Bilateral;   CESAREAN SECTION  11/22/2001   LEFT OOPHORECTOMY  2002   Dr Garwin Brothers. 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   ROBOTIC ASSISTED SALPINGO OOPHERECTOMY Right 12/27/2016   Procedure: ROBOTIC ASSISTED RIGHT OOPHORECTOMY AND PERITONEAL BIOPSY;  Surgeon: Janie Morning, MD;  Location: [facility];  Service: Gynecology;  Laterality: Right;   ROBOTIC ASSISTED TOTAL HYSTERECTOMY N/A 11/01/2014   Procedure: ROBOTIC ASSISTED TOTAL HYSTERECTOMY;  Surgeon: Servando Salina, MD;  Location: [facility];  Service: Gynecology;  Laterality: N/A;    Allergies  No Known Allergies  History of Present Illness    Joy Contreras   is a 46 year old female with the above mention past medical history who presents today for overdue follow-up.  She was initially seen by Dr. Johnsie Cancel in 2020 for complaint of tachycardia.  2D echo was completed 12/2018 with EF of 45-50%, mild LVE/LVH.  She underwent cardiac MRI for cardiomyopathy that revealed no late gadolinium uptake and mild appearing MR with no effusion.  She reported chest discomfort in 2021 and  underwent cardiac calcium scoring in 2021 that showed mild nonobstructive CAD with calcium score of 0 and minimal narrowing in LAD 1-24% and 25-49% soft plaque in distal RCA.  She wears a CPAP for obstructive sleep apnea.  Joy Contreras presents today for overdue follow-up alone.  Since last being seen in the office patient reports she has been doing well from a cardiac perspective but has noticed increased shortness of breath with exertion.  She denies any chest pain, palpitations, or dizziness with exertion.  She has noticed some swelling in her left lower extremity but not her right lower extremity.  She is currently not exercising due to overwhelming circumstances involving her mother who recently passed away.  She is compliant with her current medication regimen and denies any adverse reactions.  Her blood pressure today was 130/78 and heart rate was 71 bpm.  During our visit we discussed primary and secondary prevention for heart disease.  We also discussed the difference between the different types of heart failures. Patient denies chest pain, palpitations, dyspnea, PND, orthopnea, nausea,  vomiting, dizziness, syncope, edema, weight gain, or early satiety.    Home Medications    Current Outpatient Medications  Medication Sig Dispense Refill   carvedilol (COREG) 12.5 MG tablet Take 1 tablet (12.5 mg total) by mouth 2 (two) times daily. 180 tablet 3   COVID-19 At Home Antigen Test (CARESTART COVID-19 HOME TEST) KIT Use as directed 4 each 0   diclofenac (VOLTAREN) 75 MG EC tablet Take 1  tablet (75 mg total) by mouth 2 (two) times daily. 50 tablet 2   estradiol (CLIMARA - DOSED IN MG/24 HR) 0.1 mg/24hr patch Place 1 patch (0.1 mg total) onto the skin once a week. 12 patch 3   ivabradine (CORLANOR) 5 MG TABS tablet Take 1 tablet (5 mg total) by mouth 2 (two) times daily. 30 tablet 0   Multiple Vitamins-Minerals (HAIR SKIN AND NAILS FORMULA) TABS Take 1 tablet by mouth 2 (two) times daily.     nitroGLYCERIN (NITROSTAT) 0.4 MG SL tablet PLACE 1 TABLET (0.4 MG TOTAL) UNDER THE TONGUE EVERY 5 (FIVE) MINUTES AS NEEDED FOR CHEST PAIN. 25 tablet 3   pantoprazole (PROTONIX) 40 MG tablet Take 1 tablet (40 mg total) by mouth as needed. 90 tablet 3   No current facility-administered medications for this visit.     Review of Systems  Please see the history of present illness.    (+) Lower extremity swelling (+) Shortness of breath with exertion  All other systems reviewed and are otherwise negative except as noted above.  Physical Exam    Wt Readings from Last 3 Encounters:  04/04/22 208 lb (94.3 kg)  12/29/21 204 lb (92.5 kg)  03/29/21 201 lb 6.4 oz (91.4 kg)   VS: Vitals:   04/04/22 1317  BP: 130/78  Pulse: 71  ,Body mass index is 36.27 kg/m.  Constitutional:      Appearance: Healthy appearance. Not in distress.  Neck:     Vascular: JVD normal.  Pulmonary:     Effort: Pulmonary effort is normal.     Breath sounds: No wheezing. No rales. Diminished in the bases Cardiovascular:     Normal rate. Regular rhythm. Normal S1. Normal S2.      Murmurs: There is no murmur.  Edema:    Peripheral edema absent.  Abdominal:     Palpations: Abdomen is soft non tender. There is no hepatomegaly.  Skin:    General: Skin is warm and dry.  Neurological:     General: No focal deficit present.     Mental Status: Alert and oriented to person, place and time.     Cranial Nerves: Cranial nerves are intact.  EKG/LABS/Other Studies Reviewed    ECG personally reviewed by me today  -sinus rhythm with left axis deviation and incomplete right bundle branch block with rate of 71 bpm and no acute changes   Lab Results  Component Value Date   WBC 5.3 03/10/2021   HGB 14.0 03/10/2021   HCT 41.8 03/10/2021   MCV 86 03/10/2021   PLT 221 03/10/2021   Lab Results  Component Value Date   CREATININE 0.64 03/10/2021   BUN 11 03/10/2021   NA 140 03/10/2021   K 4.4 03/10/2021   CL 103 03/10/2021   CO2 21 03/10/2021   Lab Results  Component Value Date   ALT 23 03/10/2021   AST 19 03/10/2021   ALKPHOS 95 03/10/2021   BILITOT 0.3 03/10/2021   Lab Results  Component Value Date   Contreras 189 07/21/2015  HDL 65 07/21/2015   LDLCALC 111 (H) 07/21/2015   TRIG 66 07/21/2015   CHOLHDL 2.9 07/21/2015    Lab Results  Component Value Date   HGBA1C 5.6 10/08/2019    Assessment & Plan    1.  Nonobstructive CAD: -s/p cardiac CTA with calcium score of 0 -Today patient reports that her chest pain has resolved and she has not experienced any more discomfort since her previous visit -She is currently not exercising and was encouraged to increase physical activity as tolerated  2.  Tachycardia: -Cardiac MRI completed with normal EF -Today patient is sinus rhythm with a rate of 71 bpm. -Continue carvedilol 12.5 mg twice daily and ivabradine 5 mg  3.  Chest pain: -Today patient reports no chest pain -Calcium score 0 and 2021  4.  Shortness of breath with exertion: -Today patient reports new shortness of breath with exertion that has been occurring over the past few months. -She also has noticed increased lower extremity swelling -Patient will have a 2D echo to evaluate valve function and heart structure. -She was advised to watch her sodium intake and complete daily weights   Disposition: Follow-up with None or APP in 3 months    Medication Adjustments/Labs and Tests Ordered: Current medicines are reviewed at length with the patient today.  Concerns regarding  medicines are outlined above.   Signed, Mable Fill, Marissa Nestle, NP 04/04/2022, 2:22 PM [facility]  Note:  This document was prepared using Dragon voice recognition software and may include unintentional dictation errors.

## 2022-04-04 ENCOUNTER — Encounter: Payer: Self-pay | Admitting: Nurse Practitioner

## 2022-04-04 ENCOUNTER — Ambulatory Visit: Payer: 59 | Attending: Nurse Practitioner | Admitting: Nurse Practitioner

## 2022-04-04 VITALS — BP 130/78 | HR 71 | Ht 63.5 in | Wt 208.0 lb

## 2022-04-04 DIAGNOSIS — R0602 Shortness of breath: Secondary | ICD-10-CM | POA: Diagnosis not present

## 2022-04-04 DIAGNOSIS — R000 Tachycardia, unspecified: Secondary | ICD-10-CM

## 2022-04-04 DIAGNOSIS — I2510 Atherosclerotic heart disease of native coronary artery without angina pectoris: Secondary | ICD-10-CM

## 2022-04-04 DIAGNOSIS — R0789 Other chest pain: Secondary | ICD-10-CM

## 2022-04-04 NOTE — Patient Instructions (Signed)
Medication Instructions:  Your physician recommends that you continue on your current medications as directed. Please refer to the Current Medication list given to you today. *If you need a refill on your cardiac medications before your next appointment, please call your pharmacy*   Lab Work: NONE ORDERED   Testing/Procedures: Your physician has requested that you have an echocardiogram. Echocardiography is a painless test that uses sound waves to create images of your heart. It provides your doctor with information about the size and shape of your heart and how well your heart's chambers and valves are working. This procedure takes approximately one hour. There are no restrictions for this procedure. Please do NOT wear cologne, perfume, aftershave, or lotions (deodorant is allowed). Please arrive 15 minutes prior to your appointment time.   Follow-Up: At [facility], you and your health needs are our priority.  As part of our continuing mission to provide you with exceptional heart care, we have created designated Provider Care Teams.  These Care Teams include your primary Cardiologist (physician) and Advanced Practice Providers (APPs -  Physician Assistants and Nurse Practitioners) who all work together to provide you with the care you need, when you need it.  We recommend signing up for the patient portal called "MyChart".  Sign up information is provided on this After Visit Summary.  MyChart is used to connect with patients for Virtual Visits (Telemedicine).  Patients are able to view lab/test results, encounter notes, upcoming appointments, etc.  Non-urgent messages can be sent to your provider as well.   To learn more about what you can do with MyChart, go to [email or web address].    Your next appointment:   3 month(s)  The format for your next appointment:   In Person  Provider:   Ambrose Pancoast, NP Other Instructions   Important Information About Sugar

## 2022-04-20 ENCOUNTER — Encounter: Payer: 59 | Admitting: Internal Medicine

## 2022-05-04 ENCOUNTER — Ambulatory Visit (HOSPITAL_COMMUNITY): Payer: 59 | Attending: Nurse Practitioner

## 2022-05-04 DIAGNOSIS — I2510 Atherosclerotic heart disease of native coronary artery without angina pectoris: Secondary | ICD-10-CM | POA: Diagnosis not present

## 2022-05-04 LAB — ECHOCARDIOGRAM COMPLETE
Area-P 1/2: 5.13 cm2
S' Lateral: 3.2 cm

## 2022-05-13 DIAGNOSIS — H5213 Myopia, bilateral: Secondary | ICD-10-CM | POA: Diagnosis not present

## 2022-05-15 ENCOUNTER — Other Ambulatory Visit (HOSPITAL_COMMUNITY): Payer: Self-pay

## 2022-06-01 ENCOUNTER — Encounter: Payer: Commercial Managed Care - PPO | Admitting: Internal Medicine

## 2022-06-07 ENCOUNTER — Telehealth: Payer: Self-pay | Admitting: Neurology

## 2022-06-07 NOTE — Telephone Encounter (Signed)
Pt states she has benefited from the CPAP but has admitted to not using it for some time.  Pt asking if it is worth keeping upcoming appointment and If so can be virtual, please call. If calling before 5, please call [phone]

## 2022-06-07 NOTE — Telephone Encounter (Signed)
Spoke with patient and discussed it would be beneficial to keep appt and for insurance purposes and needing new supplies potentially, we would need documentation that it benefits her. Pt will try to be better about using her machine and at her request we pushed out her appt to 07/18/22 @ 3:15 PM. Pt will see Janett Billow NP. She was very appreciative of the call. 06/11/22 appt canceled.

## 2022-06-11 ENCOUNTER — Ambulatory Visit: Payer: Self-pay | Admitting: Neurology

## 2022-06-11 ENCOUNTER — Other Ambulatory Visit: Payer: Self-pay

## 2022-06-11 ENCOUNTER — Ambulatory Visit (INDEPENDENT_AMBULATORY_CARE_PROVIDER_SITE_OTHER): Payer: Commercial Managed Care - PPO | Admitting: Internal Medicine

## 2022-06-11 ENCOUNTER — Other Ambulatory Visit (HOSPITAL_COMMUNITY): Payer: Self-pay

## 2022-06-11 ENCOUNTER — Encounter: Payer: Self-pay | Admitting: Internal Medicine

## 2022-06-11 VITALS — BP 164/72 | HR 69 | Temp 98.2°F | Ht 63.5 in | Wt 210.1 lb

## 2022-06-11 DIAGNOSIS — E6609 Other obesity due to excess calories: Secondary | ICD-10-CM

## 2022-06-11 DIAGNOSIS — R030 Elevated blood-pressure reading, without diagnosis of hypertension: Secondary | ICD-10-CM | POA: Diagnosis not present

## 2022-06-11 DIAGNOSIS — Z780 Asymptomatic menopausal state: Secondary | ICD-10-CM | POA: Diagnosis not present

## 2022-06-11 DIAGNOSIS — E559 Vitamin D deficiency, unspecified: Secondary | ICD-10-CM | POA: Diagnosis not present

## 2022-06-11 DIAGNOSIS — Z6836 Body mass index (BMI) 36.0-36.9, adult: Secondary | ICD-10-CM

## 2022-06-11 DIAGNOSIS — R000 Tachycardia, unspecified: Secondary | ICD-10-CM

## 2022-06-11 DIAGNOSIS — E049 Nontoxic goiter, unspecified: Secondary | ICD-10-CM | POA: Diagnosis not present

## 2022-06-11 MED ORDER — CARVEDILOL 12.5 MG PO TABS
25.0000 mg | ORAL_TABLET | Freq: Two times a day (BID) | ORAL | 3 refills | Status: DC
  Filled 2022-06-11: qty 360, 90d supply, fill #0
  Filled 2023-04-06: qty 360, 90d supply, fill #1

## 2022-06-11 MED ORDER — SEMAGLUTIDE-WEIGHT MANAGEMENT 0.25 MG/0.5ML ~~LOC~~ SOAJ
0.2500 mg | SUBCUTANEOUS | 2 refills | Status: DC
  Filled 2022-06-11 – 2022-06-18 (×2): qty 2, 28d supply, fill #0
  Filled 2022-07-19 – 2022-07-26 (×2): qty 2, 28d supply, fill #1

## 2022-06-11 MED ORDER — ESTRADIOL 0.1 MG/24HR TD PTWK
0.1000 mg | MEDICATED_PATCH | TRANSDERMAL | 3 refills | Status: DC
  Filled 2022-06-11: qty 12, 84d supply, fill #0
  Filled 2023-04-06: qty 12, 84d supply, fill #1

## 2022-06-11 NOTE — Patient Instructions (Addendum)
Joy Contreras - -  Please increase your coreg to '25mg'$  twice a day.  This will be 2 tablets twice a day.  This will help with your blood pressure.   Please start Semaglutide (ozempic) for weight loss.  Below is a recommendation for titration.  If you have symptoms of nausea, vomiting or abdominal pain at any step, you can slow down and do another 4 weeks at that dose.   Week 1 through week 4 : 0.25 mg once weekly. Week 5 through week 8: 0.5 mg once weekly. Week 9 through week 12: 1 mg once weekly. Week 13 through week 16: 1.7 mg once weekly. Week 17 and thereafter (maintenance dosage): 2.4 mg once weekly   Please schedule an appointment if your leg pain comes back, otherwise, I will see you in 2 months to see how your weight is doing.

## 2022-06-11 NOTE — Progress Notes (Unsigned)
Established Patient Office Visit  Subjective   Patient ID: Joy Contreras, female    DOB: [birth year]  Age: 47 y.o. MRN: [id number]  Chief Complaint  Patient presents with   Follow-up    ROUTINE OFFICE VISIT/ ANNUAL EXAM / MEDICATION REFILL    Joy Contreras is a 47 year old woman with PMH of OSA on CPAP (just restarted), tachycardia sees cardiology, goiter, GERD who presents for routine follow up.   She notes intermittent leg pain, but not having today.  She will make an appointment if recurs.   She was noted to have elevated blood pressure today.  She is on coreg and ivabradine for this.  We discussed increasing her coreg.   She is interested in weight loss.  She has a bmi of 36 today.  Check A1C.      Patient Active Problem List   Diagnosis Date Noted   Elevated blood pressure reading 04/22/2020   Obesity 10/09/2019   Bone lesion 05/11/2019   Left hip pain 04/02/2019   Iron deficiency anemia 07/21/2015   Health care maintenance 07/21/2015   S/P total hysterectomy and BSO (bilateral salpingo-oophorectomy) 11/01/2014   Vitamin D deficiency 10/04/2014   Tachycardia 10/16/2011   Fatigue 10/16/2011   Goiter    Insomnia 04/01/2007   CHEST PAIN, NON-CARDIAC 10/01/2006   GERD 04/29/2006      Review of Systems  Constitutional:  Negative for chills, fever and weight loss.  Respiratory:  Negative for cough and shortness of breath.   Cardiovascular:  Negative for chest pain, palpitations and leg swelling.  Gastrointestinal:  Negative for heartburn, nausea and vomiting.  Genitourinary:  Negative for dysuria.  Musculoskeletal:  Positive for myalgias (intermittent leg pain).  Neurological:  Negative for dizziness and weakness.      Objective:     BP (!) 164/72 (BP Location: Left Arm, Patient Position: Sitting, Cuff Size: Large) Comment: Did not take HTN meds.  Pulse 69   Temp 98.2 F (36.8 C) (Oral)   Ht 5' 3.5" (1.613 m)   Wt 210 lb 1.6 oz (95.3 kg)   LMP 02/02/2013    SpO2 92%   BMI 36.63 kg/m  Wt Readings from Last 3 Encounters:  06/11/22 210 lb 1.6 oz (95.3 kg)  04/04/22 208 lb (94.3 kg)  12/29/21 204 lb (92.5 kg)      Physical Exam Vitals and nursing note reviewed.  Constitutional:      Appearance: Normal appearance.  HENT:     Head: Normocephalic and atraumatic.  Cardiovascular:     Rate and Rhythm: Normal rate and regular rhythm.     Heart sounds: No murmur heard. Pulmonary:     Effort: Pulmonary effort is normal. No respiratory distress.  Skin:    General: Skin is warm and dry.     Coloration: Skin is not jaundiced.  Neurological:     Mental Status: She is alert and oriented to person, place, and time. Mental status is at baseline.  Psychiatric:        Mood and Affect: Mood normal.        Behavior: Behavior normal.       The ASCVD Risk score (Arnett DK, et al., 2019) failed to calculate for the following reasons:   Cannot find a previous HDL lab   Cannot find a previous total cholesterol lab    Assessment & Plan:   Problem List Items Addressed This Visit       Unprioritized   Goiter - Primary (Chronic)  Relevant Medications   carvedilol (COREG) 12.5 MG tablet   Other Relevant Orders   T4, Free   T3   TSH   Tachycardia (Chronic)   Vitamin D deficiency (Chronic)   Relevant Orders   Vitamin D (25 hydroxy)   Obesity   Relevant Medications   Semaglutide-Weight Management 0.25 MG/0.5ML SOAJ   Other Relevant Orders   POC Hbg A1C   Lipid Profile   Elevated blood pressure reading   Relevant Orders   BMP8+Anion Gap   Other Visit Diagnoses     Post-menopausal       Relevant Medications   estradiol (CLIMARA - DOSED IN MG/24 HR) 0.1 mg/24hr patch       Return in about 2 months (around 08/10/2022).    Gilles Chiquito, MD

## 2022-06-12 ENCOUNTER — Other Ambulatory Visit: Payer: Commercial Managed Care - PPO

## 2022-06-12 DIAGNOSIS — R030 Elevated blood-pressure reading, without diagnosis of hypertension: Secondary | ICD-10-CM | POA: Diagnosis not present

## 2022-06-12 DIAGNOSIS — Z6836 Body mass index (BMI) 36.0-36.9, adult: Secondary | ICD-10-CM | POA: Diagnosis not present

## 2022-06-12 DIAGNOSIS — E049 Nontoxic goiter, unspecified: Secondary | ICD-10-CM | POA: Diagnosis not present

## 2022-06-12 DIAGNOSIS — E6609 Other obesity due to excess calories: Secondary | ICD-10-CM | POA: Diagnosis not present

## 2022-06-12 DIAGNOSIS — E559 Vitamin D deficiency, unspecified: Secondary | ICD-10-CM | POA: Diagnosis not present

## 2022-06-13 ENCOUNTER — Other Ambulatory Visit (HOSPITAL_COMMUNITY): Payer: Self-pay

## 2022-06-13 DIAGNOSIS — G4733 Obstructive sleep apnea (adult) (pediatric): Secondary | ICD-10-CM | POA: Diagnosis not present

## 2022-06-13 LAB — T3: T3, Total: 128 ng/dL (ref 71–180)

## 2022-06-13 LAB — BMP8+ANION GAP
Anion Gap: 15 mmol/L (ref 10.0–18.0)
BUN/Creatinine Ratio: 19 (ref 9–23)
BUN: 11 mg/dL (ref 6–24)
CO2: 21 mmol/L (ref 20–29)
Calcium: 9.3 mg/dL (ref 8.7–10.2)
Chloride: 105 mmol/L (ref 96–106)
Creatinine, Ser: 0.58 mg/dL (ref 0.57–1.00)
Glucose: 87 mg/dL (ref 70–99)
Potassium: 4.4 mmol/L (ref 3.5–5.2)
Sodium: 141 mmol/L (ref 134–144)
eGFR: 112 mL/min/{1.73_m2} (ref >59)

## 2022-06-13 LAB — TSH: TSH: 1.05 u[IU]/mL (ref 0.450–4.500)

## 2022-06-13 LAB — LIPID PANEL
Chol/HDL Ratio: 3.3 ratio (ref 0.0–4.4)
Cholesterol, Total: 214 mg/dL — ABNORMAL HIGH (ref 100–199)
HDL: 64 mg/dL (ref >39)
LDL Chol Calc (NIH): 137 mg/dL — ABNORMAL HIGH (ref 0–99)
Triglycerides: 73 mg/dL (ref 0–149)
VLDL Cholesterol Cal: 13 mg/dL (ref 5–40)

## 2022-06-13 LAB — HEMOGLOBIN A1C
Est. average glucose Bld gHb Est-mCnc: 117 mg/dL
Hgb A1c MFr Bld: 5.7 % — ABNORMAL HIGH (ref 4.8–5.6)

## 2022-06-13 LAB — T4, FREE: Free T4: 1.06 ng/dL (ref 0.82–1.77)

## 2022-06-13 LAB — VITAMIN D 25 HYDROXY (VIT D DEFICIENCY, FRACTURES): Vit D, 25-Hydroxy: 45.9 ng/mL (ref 30.0–100.0)

## 2022-06-13 NOTE — Assessment & Plan Note (Signed)
Previously noted, will check today.

## 2022-06-13 NOTE — Assessment & Plan Note (Signed)
She notes no changes today in her neck size, no changes to symptoms.  She did have some heart symptoms previously, but these have improved with use of CPAP.   Plan Check TFTs today.

## 2022-06-13 NOTE — Assessment & Plan Note (Signed)
Will refill coreg.  Also increasing coreg given elevated blood pressure.   Pulse 69 today.

## 2022-06-13 NOTE — Assessment & Plan Note (Addendum)
We discussed weight loss today.  A1C checked today is consistent with pre-DM.   Will start Ozempic.  We discussed uptitration and she will let me know when she is ready for a different size pen.   LDL is also elevated, will ask her to get fasting sample for confirmation.   Plan Start Ozempic 0.25 SQ weekly

## 2022-06-13 NOTE — Assessment & Plan Note (Signed)
On first check and then recheck, BP is elevated.  She has recently restarted her CPAP.  She will also increase her Coreg to '25mg'$  BID.   She will check her blood pressure at work and report to me the findings.

## 2022-06-14 ENCOUNTER — Other Ambulatory Visit (HOSPITAL_COMMUNITY): Payer: Self-pay

## 2022-06-15 ENCOUNTER — Other Ambulatory Visit (HOSPITAL_COMMUNITY): Payer: Self-pay

## 2022-06-15 ENCOUNTER — Other Ambulatory Visit: Payer: Self-pay

## 2022-06-18 ENCOUNTER — Other Ambulatory Visit (HOSPITAL_COMMUNITY): Payer: Self-pay

## 2022-06-26 ENCOUNTER — Other Ambulatory Visit (HOSPITAL_COMMUNITY): Payer: Self-pay

## 2022-06-26 ENCOUNTER — Other Ambulatory Visit: Payer: Self-pay

## 2022-06-28 ENCOUNTER — Other Ambulatory Visit (HOSPITAL_COMMUNITY): Payer: Self-pay

## 2022-07-06 ENCOUNTER — Ambulatory Visit: Payer: Commercial Managed Care - PPO | Admitting: Nurse Practitioner

## 2022-07-12 DIAGNOSIS — G4733 Obstructive sleep apnea (adult) (pediatric): Secondary | ICD-10-CM | POA: Diagnosis not present

## 2022-07-17 ENCOUNTER — Ambulatory Visit (INDEPENDENT_AMBULATORY_CARE_PROVIDER_SITE_OTHER): Payer: Commercial Managed Care - PPO | Admitting: Student

## 2022-07-17 ENCOUNTER — Ambulatory Visit (HOSPITAL_COMMUNITY)
Admission: RE | Admit: 2022-07-17 | Discharge: 2022-07-17 | Disposition: A | Discharge status: Home / Self Care | Payer: Commercial Managed Care - PPO | Source: Ambulatory Visit | Attending: Internal Medicine | Admitting: Internal Medicine

## 2022-07-17 ENCOUNTER — Encounter: Payer: Self-pay | Admitting: Student

## 2022-07-17 VITALS — BP 155/97 | HR 98 | Temp 99.8°F | Ht 63.5 in | Wt 204.8 lb

## 2022-07-17 DIAGNOSIS — R6889 Other general symptoms and signs: Secondary | ICD-10-CM | POA: Insufficient documentation

## 2022-07-17 DIAGNOSIS — R509 Fever, unspecified: Secondary | ICD-10-CM | POA: Diagnosis not present

## 2022-07-17 DIAGNOSIS — J101 Influenza due to other identified influenza virus with other respiratory manifestations: Secondary | ICD-10-CM | POA: Diagnosis not present

## 2022-07-17 LAB — GROUP A STREP BY PCR: Group A Strep by PCR: NOT DETECTED

## 2022-07-17 LAB — RESP PANEL BY RT-PCR (FLU A&B, COVID) ARPGX2
Influenza A by PCR: POSITIVE — AB
Influenza B by PCR: NEGATIVE
SARS Coronavirus 2 by RT PCR: NEGATIVE

## 2022-07-17 MED ORDER — OSELTAMIVIR PHOSPHATE 75 MG PO CAPS: 75.0000 mg | ORAL_CAPSULE | Freq: Two times a day (BID) | ORAL | 0 refills | Status: AC

## 2022-07-17 NOTE — Progress Notes (Addendum)
   CC: Flu like symptoms  HPI:  [name] is a 47 y.o. living with dysthymia, OSA who presents to the clinic for evaluation of flu like symptoms.   Please see problem based charting for detail.   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 Dx with Autonomously functioning goiter during pregnancy. anti-TPO negative.    Hypertension    Menorrhagia    Being tx by GYN   Sleep apnea    wears CPAP   Tachycardia    Required BB during pregnancy. Baseline 100-130 at rest   Review of Systems:  per HPI  Physical Exam:  Vitals:   07/17/22 1058  BP: (!) 155/97  Pulse: 98  Temp: 99.8 F (37.7 C)  TempSrc: Oral  SpO2: 100%  Weight: 204 lb 12.8 oz (92.9 kg)  Height: 5' 3.5" (1.613 m)   Physical Exam Constitutional:      General: She is not in acute distress.    Appearance: She is not ill-appearing.  HENT:     Head: Normocephalic.     Nose: Rhinorrhea present.     Mouth/Throat:     Pharynx: Posterior oropharyngeal erythema present. No oropharyngeal exudate.  Cardiovascular:     Rate and Rhythm: Normal rate and regular rhythm.  Pulmonary:     Effort: Pulmonary effort is normal. No respiratory distress.     Comments: Mild crackles at Right lower lung base Musculoskeletal:     Cervical back: Normal range of motion.  Skin:    General: Skin is warm.  Neurological:     Mental Status: She is alert. Mental status is at baseline.  Psychiatric:        Mood and Affect: Mood normal.      Assessment & Plan:   See Encounters Tab for problem based charting.  Flu-like symptoms Patient report flu-like symptoms about 1 week ago and that only lasted for 3 days.  She felt better after that and only had a mild cough for the next few days.  Last night, she suddenly  experienced symptoms of chills, fever 101, nausea, vomiting, coughing, body aches, runny nose, congestion.  She denies sore throat or diarrhea.  Her daughter had COVID 2 weeks ago also contracted flu and strep throat 1 week ago.  She is currently being hospitalized for IV fluids symptomatic treatment.  Her son is also having similar symptoms.  -Obtain COVID/flu swab -Obtain Strep throat PCR.  Even though she has no sore throat symptoms, she states that her last strep throat infection presented similarly to this episode. -Will obtain a chest x-ray to rule out pneumonia given her presentation with mild crackles lower lobe lung base -Symptomatic treatment with Tylenol  Addendum Nasal swab positive for flu A.  COVID and strep PCR were negative.  Chest x-ray is clear without any consolidation, no evidence of bacterial pneumonia.  Patient was informed of result.  Sent in prescription of Tamiflu 75 mg BID x 5 days.    Patient discussed with Dr. Evette Doffing

## 2022-07-17 NOTE — Patient Instructions (Signed)
I am sorry that you are not feeling well.   I will call you for these tests results. Please get plenty of rest and hydrate.   Dr. Alfonse Spruce

## 2022-07-17 NOTE — Addendum Note (Signed)
Addended byGaylan Gerold on: 07/17/2022 03:51 PM   Modules accepted: Orders

## 2022-07-17 NOTE — Assessment & Plan Note (Addendum)
Patient report flu-like symptoms about 1 week ago and that only lasted for 3 days.  She felt better after that and only had a mild cough for the next few days.  Last night, she suddenly experienced symptoms of chills, fever 101, nausea, vomiting, coughing, body aches, runny nose, congestion.  She denies sore throat or diarrhea.  Her daughter had COVID 2 weeks ago also contracted flu and strep throat 1 week ago.  She is currently being hospitalized for IV fluids symptomatic treatment.  Her son is also having similar symptoms.  -Obtain COVID/flu swab -Obtain Strep throat PCR.  Even though she has no sore throat symptoms, she states that her last strep throat infection presented similarly to this episode. -Will obtain a chest x-ray to rule out pneumonia given her presentation with mild crackles lower lobe lung base -Symptomatic treatment with Tylenol  Addendum Nasal swab positive for flu A.  COVID and strep PCR were negative.  Chest x-ray is clear without any consolidation, no evidence of bacterial pneumonia.  Patient was informed of result.  Sent in prescription of Tamiflu 75 mg BID x 5 days.

## 2022-07-18 ENCOUNTER — Ambulatory Visit: Payer: Commercial Managed Care - PPO | Admitting: Adult Health

## 2022-07-18 NOTE — Progress Notes (Signed)
Internal Medicine Clinic Attending  Case discussed with Dr. Nguyen  At the time of the visit.  We reviewed the resident's history and exam and pertinent patient test results.  I agree with the assessment, diagnosis, and plan of care documented in the resident's note. 

## 2022-07-26 ENCOUNTER — Other Ambulatory Visit (HOSPITAL_COMMUNITY): Payer: Self-pay

## 2022-08-01 ENCOUNTER — Other Ambulatory Visit: Payer: Self-pay | Admitting: Podiatry

## 2022-08-01 ENCOUNTER — Other Ambulatory Visit: Payer: Self-pay | Admitting: Cardiovascular Disease

## 2022-08-02 ENCOUNTER — Other Ambulatory Visit (HOSPITAL_COMMUNITY): Payer: Self-pay

## 2022-08-02 MED ORDER — IVABRADINE HCL 5 MG PO TABS
5.0000 mg | ORAL_TABLET | Freq: Two times a day (BID) | ORAL | 2 refills | Status: DC
  Filled 2022-08-02 – 2023-01-02 (×7): qty 180, 90d supply, fill #0
  Filled 2023-01-04: qty 60, 30d supply, fill #0
  Filled 2023-01-24 (×4): qty 180, 90d supply, fill #0
  Filled 2023-05-10: qty 180, 90d supply, fill #1

## 2022-08-03 ENCOUNTER — Other Ambulatory Visit (HOSPITAL_COMMUNITY): Payer: Self-pay

## 2022-08-06 ENCOUNTER — Other Ambulatory Visit (HOSPITAL_COMMUNITY): Payer: Self-pay

## 2022-08-07 ENCOUNTER — Other Ambulatory Visit (HOSPITAL_COMMUNITY): Payer: Self-pay

## 2022-08-08 ENCOUNTER — Other Ambulatory Visit: Payer: Self-pay | Admitting: *Deleted

## 2022-08-08 ENCOUNTER — Other Ambulatory Visit (HOSPITAL_COMMUNITY): Payer: Self-pay

## 2022-08-08 MED ORDER — SEMAGLUTIDE-WEIGHT MANAGEMENT 0.5 MG/0.5ML ~~LOC~~ SOAJ
0.5000 mg | SUBCUTANEOUS | 2 refills | Status: DC
  Filled 2022-08-08: qty 2, 28d supply, fill #0
  Filled 2022-08-14: qty 2, fill #0
  Filled 2022-08-15: qty 2, 28d supply, fill #0

## 2022-08-08 NOTE — Telephone Encounter (Signed)
Patient requests increase to next dose of Wegovy.

## 2022-08-09 ENCOUNTER — Other Ambulatory Visit: Payer: Self-pay

## 2022-08-09 ENCOUNTER — Other Ambulatory Visit (HOSPITAL_COMMUNITY): Payer: Self-pay

## 2022-08-10 ENCOUNTER — Other Ambulatory Visit (HOSPITAL_COMMUNITY): Payer: Self-pay

## 2022-08-12 DIAGNOSIS — G4733 Obstructive sleep apnea (adult) (pediatric): Secondary | ICD-10-CM | POA: Diagnosis not present

## 2022-08-15 ENCOUNTER — Other Ambulatory Visit: Payer: Self-pay

## 2022-08-15 ENCOUNTER — Other Ambulatory Visit (HOSPITAL_COMMUNITY): Payer: Self-pay

## 2022-08-17 ENCOUNTER — Other Ambulatory Visit (HOSPITAL_COMMUNITY): Payer: Self-pay

## 2022-08-20 ENCOUNTER — Other Ambulatory Visit (HOSPITAL_COMMUNITY): Payer: Self-pay

## 2022-08-22 ENCOUNTER — Other Ambulatory Visit (HOSPITAL_COMMUNITY): Payer: Self-pay

## 2022-08-27 ENCOUNTER — Other Ambulatory Visit (HOSPITAL_COMMUNITY): Payer: Self-pay

## 2022-09-06 ENCOUNTER — Other Ambulatory Visit (HOSPITAL_COMMUNITY): Payer: Self-pay

## 2022-11-14 ENCOUNTER — Other Ambulatory Visit: Payer: Self-pay

## 2022-11-24 DIAGNOSIS — G4733 Obstructive sleep apnea (adult) (pediatric): Secondary | ICD-10-CM | POA: Diagnosis not present

## 2022-12-21 ENCOUNTER — Ambulatory Visit (INDEPENDENT_AMBULATORY_CARE_PROVIDER_SITE_OTHER): Payer: Commercial Managed Care - PPO | Admitting: Internal Medicine

## 2022-12-21 ENCOUNTER — Other Ambulatory Visit (HOSPITAL_COMMUNITY): Payer: Self-pay

## 2022-12-21 ENCOUNTER — Other Ambulatory Visit: Payer: Self-pay

## 2022-12-21 ENCOUNTER — Encounter: Payer: Self-pay | Admitting: Internal Medicine

## 2022-12-21 VITALS — BP 153/103 | HR 70 | Temp 98.0°F | Ht 64.0 in | Wt 204.0 lb

## 2022-12-21 DIAGNOSIS — M7710 Lateral epicondylitis, unspecified elbow: Secondary | ICD-10-CM | POA: Insufficient documentation

## 2022-12-21 DIAGNOSIS — M7711 Lateral epicondylitis, right elbow: Secondary | ICD-10-CM | POA: Diagnosis not present

## 2022-12-21 DIAGNOSIS — I2510 Atherosclerotic heart disease of native coronary artery without angina pectoris: Secondary | ICD-10-CM | POA: Diagnosis not present

## 2022-12-21 MED ORDER — SEMAGLUTIDE-WEIGHT MANAGEMENT 1 MG/0.5ML ~~LOC~~ SOAJ
1.0000 mg | SUBCUTANEOUS | 11 refills | Status: DC
  Filled 2022-12-21: qty 2, 28d supply, fill #0

## 2022-12-21 NOTE — Assessment & Plan Note (Signed)
She may have a portion of de quervain's tenosynovitis as well.   Will start with RICE (without elevation), advised tennis elbow brace and NSAID cream to the lateral epicondyle.    She will get a brace today and see if this therapy helps.

## 2022-12-21 NOTE — Patient Instructions (Addendum)
Please see references for tennis elbow.    Come back as planned for follow up in January.

## 2022-12-21 NOTE — Assessment & Plan Note (Signed)
Reviewed note by Cardiology Dr. Louanne Skye, she is noted to have non obstructive heart disease with calcium score of 0.  She will need risk factor reduction including weight loss.  Will order semaglutide to help with this and for cardioprotection.   Per review of indications Wegovy 2.4mg  is indicated to reduce the risk of major adverse cardiovascular events (cardiovascular death, non-fatal myocardial infarction, or non-fatal stroke) in adults with established cardiovascular disease and either obesity or overweight

## 2022-12-21 NOTE — Progress Notes (Signed)
   Established Patient Office Visit  Subjective   Patient ID: Joy Contreras, female    DOB: [birth year]  Age: 47 y.o. MRN: [id number]  Chief Complaint  Patient presents with   Right arm pain    Right lateral epicondylitis Joy Contreras is a 47 year old woman with PMH of GERD, tachycardia, chest pain, HTN who presents for follow up.   She notes about 1 week ago, after carrying in groceries, that she developed pain in the lateral side of her right arm radiating down to her thumb.  The pain is worse with flexion of the fingers, finkelsteins test and pronation of the arm.  She has had trouble holding things like coffee cups, grip strength and opening door knobs.  She has not had this before.    She further notes hoping that her wegovy will be covered for a diagnosis of CVD.      Review of Systems  Constitutional:  Negative for chills and fever.  Musculoskeletal:        Pain in the lateral arm to the thumb.  No numbness      Objective:     BP (!) 153/103 (BP Location: Left Arm, Patient Position: Sitting, Cuff Size: Normal) Comment: did not take BP meds  Pulse 70   Temp 98 F (36.7 C) (Oral)   Ht 5\' 4"  (1.626 m)   Wt 204 lb (92.5 kg)   LMP 02/02/2013   SpO2 99% Comment: RA  BMI 35.02 kg/m    Physical Exam Vitals and nursing note reviewed.  Constitutional:      General: She is not in acute distress.    Appearance: Normal appearance. She is not toxic-appearing.  Musculoskeletal:        General: Tenderness (lateral epicondyle) present. No swelling, deformity or signs of injury. Normal range of motion.     Comments: She has pain with pronation at the lateral epicondyle and pain with finklestein's test.   Neurological:     Mental Status: She is alert.      No results found for any visits on 12/21/22.    The 10-year ASCVD risk score (Arnett DK, et al., 2019) is: 5%    Assessment & Plan:   Problem List Items Addressed This Visit       Unprioritized   CAD (coronary  artery disease), native coronary artery - Primary    Reviewed note by Cardiology Dr. Louanne Skye, she is noted to have non obstructive heart disease with calcium score of 0.  She will need risk factor reduction including weight loss.  Will order semaglutide to help with this and for cardioprotection.   Per review of indications Wegovy 2.4mg  is indicated to reduce the risk of major adverse cardiovascular events (cardiovascular death, non-fatal myocardial infarction, or non-fatal stroke) in adults with established cardiovascular disease and either obesity or overweight      Relevant Medications   Semaglutide-Weight Management 1 MG/0.5ML SOAJ   Lateral epicondylitis    She may have a portion of de quervain's tenosynovitis as well.   Will start with RICE (without elevation), advised tennis elbow brace and NSAID cream to the lateral epicondyle.    She will get a brace today and see if this therapy helps.        Return in about 5 months (around 05/23/2023).    Debe Coder, MD

## 2022-12-25 DIAGNOSIS — G4733 Obstructive sleep apnea (adult) (pediatric): Secondary | ICD-10-CM | POA: Diagnosis not present

## 2022-12-28 ENCOUNTER — Other Ambulatory Visit (HOSPITAL_COMMUNITY): Payer: Self-pay

## 2022-12-31 ENCOUNTER — Other Ambulatory Visit (HOSPITAL_COMMUNITY): Payer: Self-pay

## 2023-01-01 ENCOUNTER — Other Ambulatory Visit (HOSPITAL_COMMUNITY): Payer: Self-pay

## 2023-01-02 ENCOUNTER — Other Ambulatory Visit (HOSPITAL_COMMUNITY): Payer: Self-pay

## 2023-01-04 ENCOUNTER — Other Ambulatory Visit (HOSPITAL_COMMUNITY): Payer: Self-pay

## 2023-01-08 ENCOUNTER — Other Ambulatory Visit (HOSPITAL_COMMUNITY): Payer: Self-pay

## 2023-01-11 ENCOUNTER — Other Ambulatory Visit (HOSPITAL_COMMUNITY): Payer: Self-pay

## 2023-01-24 ENCOUNTER — Other Ambulatory Visit (HOSPITAL_COMMUNITY): Payer: Self-pay

## 2023-01-24 NOTE — Telephone Encounter (Signed)
Will route to PharmD and patient assistance nurse for input on Corlanor cost and also the pharmacy request for cardiology to prescribe Wegovy.  I did call the pharmacy but did not reach anyone and did not leave a message.

## 2023-01-24 NOTE — Telephone Encounter (Signed)
It's filled at [facility] for $15. They did have a note on the fill stating they have to order it and patient is aware.

## 2023-01-25 ENCOUNTER — Other Ambulatory Visit (HOSPITAL_COMMUNITY): Payer: Self-pay

## 2023-01-25 DIAGNOSIS — G4733 Obstructive sleep apnea (adult) (pediatric): Secondary | ICD-10-CM | POA: Diagnosis not present

## 2023-04-05 ENCOUNTER — Ambulatory Visit (INDEPENDENT_AMBULATORY_CARE_PROVIDER_SITE_OTHER): Payer: Commercial Managed Care - PPO | Admitting: Internal Medicine

## 2023-04-05 ENCOUNTER — Encounter: Payer: Self-pay | Admitting: Internal Medicine

## 2023-04-05 ENCOUNTER — Other Ambulatory Visit (HOSPITAL_COMMUNITY): Payer: Self-pay

## 2023-04-05 ENCOUNTER — Other Ambulatory Visit: Payer: Self-pay

## 2023-04-05 VITALS — BP 139/74 | HR 60 | Temp 97.8°F | Ht 64.0 in | Wt 205.6 lb

## 2023-04-05 DIAGNOSIS — Z6835 Body mass index (BMI) 35.0-35.9, adult: Secondary | ICD-10-CM

## 2023-04-05 DIAGNOSIS — I2510 Atherosclerotic heart disease of native coronary artery without angina pectoris: Secondary | ICD-10-CM | POA: Diagnosis not present

## 2023-04-05 DIAGNOSIS — M899 Disorder of bone, unspecified: Secondary | ICD-10-CM

## 2023-04-05 DIAGNOSIS — E7800 Pure hypercholesterolemia, unspecified: Secondary | ICD-10-CM | POA: Diagnosis not present

## 2023-04-05 DIAGNOSIS — R000 Tachycardia, unspecified: Secondary | ICD-10-CM | POA: Diagnosis not present

## 2023-04-05 DIAGNOSIS — K219 Gastro-esophageal reflux disease without esophagitis: Secondary | ICD-10-CM | POA: Diagnosis not present

## 2023-04-05 DIAGNOSIS — E669 Obesity, unspecified: Secondary | ICD-10-CM | POA: Diagnosis not present

## 2023-04-05 DIAGNOSIS — R7309 Other abnormal glucose: Secondary | ICD-10-CM

## 2023-04-05 DIAGNOSIS — R413 Other amnesia: Secondary | ICD-10-CM | POA: Diagnosis not present

## 2023-04-05 DIAGNOSIS — R030 Elevated blood-pressure reading, without diagnosis of hypertension: Secondary | ICD-10-CM | POA: Diagnosis not present

## 2023-04-05 DIAGNOSIS — D509 Iron deficiency anemia, unspecified: Secondary | ICD-10-CM | POA: Diagnosis not present

## 2023-04-05 DIAGNOSIS — E66812 Obesity, class 2: Secondary | ICD-10-CM

## 2023-04-05 DIAGNOSIS — E049 Nontoxic goiter, unspecified: Secondary | ICD-10-CM

## 2023-04-05 DIAGNOSIS — M7711 Lateral epicondylitis, right elbow: Secondary | ICD-10-CM

## 2023-04-05 MED ORDER — ATOMOXETINE HCL 40 MG PO CAPS
40.0000 mg | ORAL_CAPSULE | Freq: Every day | ORAL | 1 refills | Status: DC
  Filled 2023-04-05: qty 30, 30d supply, fill #0
  Filled 2023-05-10: qty 30, 30d supply, fill #1

## 2023-04-05 NOTE — Progress Notes (Signed)
Established Patient Office Visit  Subjective   Patient ID: Joy Contreras, female    DOB: 1975/10/20  Age: 47 y.o. MRN: [id number]  Chief Complaint  Patient presents with   Annual Exam   Memory Loss     Pt stated she has been  having memory issues  for  some years but it is getting worse for the past 2 years     Memory loss - many years attention and organization issues.  She notes the biggest issues are misplacing things in the home, not remembering things people tell her.  She notes this has gotten worse and now she is forgetting to chart things at work ([employer]) and missing information for patients.  She has had symptoms since she was a child.  She did not think much of it until her kids were diagnosed with ADD and she is curious if she could have it too.  She did a self evaluation test today in clinic and she has symptoms suggestive of ADD.  We discussed starting medication (non stimulant) vs. Referral to [facility] and she would like to try atomoxetine (Strattera) first. We went over possible early onset dementia, depression and anxiety and she does not have symptoms concerning for these.  We will check for reversible causes of memory changes. She notes her insomnia is okay right now.  She is only using her CPAP about 3X a week.   Otherwise she is doing well, she notes not being able to get [facility], so she would like to be referred to weight management.  She has been taking her coreg and BP is controlled today.     Review of Systems  Constitutional:  Negative for chills, fever, malaise/fatigue and weight loss.  Respiratory:  Negative for cough and shortness of breath.   Cardiovascular:  Negative for chest pain.  Genitourinary:  Negative for dysuria.  Neurological:        Memory change      Objective:     BP 139/74 (BP Location: Left Arm, Patient Position: Sitting, Cuff Size: Large)   Pulse 60   Temp 97.8 F (36.6 C) (Oral)   Ht 5\' 4"  (1.626 m)   Wt 205 lb 9.6 oz  (93.3 kg)   LMP 02/02/2013   SpO2 100%   BMI 35.29 kg/m    Physical Exam Vitals and nursing note reviewed.  Constitutional:      General: She is not in acute distress.    Appearance: Normal appearance. She is not toxic-appearing.  HENT:     Head: Normocephalic and atraumatic.  Cardiovascular:     Rate and Rhythm: Normal rate and regular rhythm.     Heart sounds: No murmur heard. Pulmonary:     Effort: Pulmonary effort is normal. No respiratory distress.  Abdominal:     General: Abdomen is flat.     Palpations: Abdomen is soft.  Skin:    General: Skin is warm and dry.  Neurological:     General: No focal deficit present.     Mental Status: She is alert. Mental status is at baseline.     Motor: No weakness.     Gait: Gait normal.  Psychiatric:        Mood and Affect: Mood normal.        Behavior: Behavior normal.        Thought Content: Thought content normal.        Assessment & Plan:   Problem List Items Addressed This Visit  Unprioritized   GERD (Chronic)    Well controlled.  On pantoprazole as needed.  Continue      Goiter (Chronic)    No change in symptoms, beyond worsening memory issues.  Check TFTs today.  Neck is supple with no enlargement noted.       Relevant Orders   T4, Free   T3   TSH   Tachycardia (Chronic)    Follows with cardiology.  Given the memory loss and possibly ADD, would not treat with stimulant without approval of cardiology.   Plan Continue ivabradine and coreg      Iron deficiency anemia (Chronic)   Relevant Orders   CBC no Diff   Obesity    She is interested in trial of weight management clinic, will place order.       Relevant Medications   atomoxetine (STRATTERA) 40 MG capsule   Bone lesion    She has opted not to work up further, hip pain improved.  Monitor.       Elevated blood pressure reading    Recheck today is controlled. Monitor.       CAD (coronary artery disease), native coronary artery    Relevant Orders   CMP14 + Anion Gap   Lipid Profile   Hemoglobin A1c   Lateral epicondylitis    Controlled with bracing PRN      Memory loss - Primary    TSH, free T4, T3, RPR, b12 today.   Trial of strattera.  Discuss referral to [facility] specialist if not improving.       Relevant Orders   TSH   Vitamin B12   RPR   Other Visit Diagnoses     Elevated LDL cholesterol level       Relevant Orders   Lipid Profile   Elevated hemoglobin A1c       Relevant Orders   Hemoglobin A1c       No follow-ups on file.    Debe Coder, MD

## 2023-04-05 NOTE — Assessment & Plan Note (Signed)
Recheck today is controlled. Monitor.

## 2023-04-05 NOTE — Assessment & Plan Note (Signed)
TSH, free T4, T3, RPR, b12 today.   Trial of strattera.  Discuss referral to [facility] specialist if not improving.

## 2023-04-05 NOTE — Assessment & Plan Note (Signed)
She has opted not to work up further, hip pain improved.  Monitor.

## 2023-04-05 NOTE — Assessment & Plan Note (Signed)
Controlled with bracing PRN

## 2023-04-05 NOTE — Assessment & Plan Note (Signed)
She is interested in trial of weight management clinic, will place order.

## 2023-04-05 NOTE — Assessment & Plan Note (Signed)
Follows with cardiology.  Given the memory loss and possibly ADD, would not treat with stimulant without approval of cardiology.   Plan Continue ivabradine and coreg

## 2023-04-05 NOTE — Assessment & Plan Note (Signed)
No change in symptoms, beyond worsening memory issues.  Check TFTs today.  Neck is supple with no enlargement noted.

## 2023-04-05 NOTE — Assessment & Plan Note (Signed)
Well controlled.  On pantoprazole as needed.  Continue

## 2023-04-08 ENCOUNTER — Other Ambulatory Visit (HOSPITAL_COMMUNITY): Payer: Self-pay

## 2023-04-08 LAB — RPR: RPR Ser Ql: NONREACTIVE

## 2023-04-08 LAB — LIPID PANEL
Chol/HDL Ratio: 3 ratio (ref 0.0–4.4)
Cholesterol, Total: 198 mg/dL (ref 100–199)
HDL: 65 mg/dL (ref >39)
LDL Chol Calc (NIH): 123 mg/dL — ABNORMAL HIGH (ref 0–99)
Triglycerides: 55 mg/dL (ref 0–149)
VLDL Cholesterol Cal: 10 mg/dL (ref 5–40)

## 2023-04-08 LAB — CMP14 + ANION GAP
ALT: 25 [IU]/L (ref 0–32)
AST: 25 [IU]/L (ref 0–40)
Albumin: 4.1 g/dL (ref 3.9–4.9)
Alkaline Phosphatase: 119 [IU]/L (ref 44–121)
Anion Gap: 11 mmol/L (ref 10.0–18.0)
BUN/Creatinine Ratio: 13 (ref 9–23)
BUN: 9 mg/dL (ref 6–24)
Bilirubin Total: 0.3 mg/dL (ref 0.0–1.2)
CO2: 25 mmol/L (ref 20–29)
Calcium: 9.3 mg/dL (ref 8.7–10.2)
Chloride: 104 mmol/L (ref 96–106)
Creatinine, Ser: 0.71 mg/dL (ref 0.57–1.00)
Globulin, Total: 2.8 g/dL (ref 1.5–4.5)
Glucose: 93 mg/dL (ref 70–99)
Potassium: 4.4 mmol/L (ref 3.5–5.2)
Sodium: 140 mmol/L (ref 134–144)
Total Protein: 6.9 g/dL (ref 6.0–8.5)
eGFR: 105 mL/min/{1.73_m2} (ref >59)

## 2023-04-08 LAB — CBC
Hematocrit: 41.7 % (ref 34.0–46.6)
Hemoglobin: 13.5 g/dL (ref 11.1–15.9)
MCH: 28.7 pg (ref 26.6–33.0)
MCHC: 32.4 g/dL (ref 31.5–35.7)
MCV: 89 fL (ref 79–97)
Platelets: 228 10*3/uL (ref 150–450)
RBC: 4.7 x10E6/uL (ref 3.77–5.28)
RDW: 12.4 % (ref 11.7–15.4)
WBC: 5.2 10*3/uL (ref 3.4–10.8)

## 2023-04-08 LAB — T4, FREE: Free T4: 1.07 ng/dL (ref 0.82–1.77)

## 2023-04-08 LAB — T3: T3, Total: 126 ng/dL (ref 71–180)

## 2023-04-08 LAB — TSH: TSH: 0.686 u[IU]/mL (ref 0.450–4.500)

## 2023-04-08 LAB — HEMOGLOBIN A1C
Est. average glucose Bld gHb Est-mCnc: 120 mg/dL
Hgb A1c MFr Bld: 5.8 % — ABNORMAL HIGH (ref 4.8–5.6)

## 2023-04-08 LAB — VITAMIN B12: Vitamin B-12: 703 pg/mL (ref 232–1245)

## 2023-04-10 ENCOUNTER — Encounter: Payer: Self-pay | Admitting: Internal Medicine

## 2023-04-10 ENCOUNTER — Other Ambulatory Visit: Payer: Self-pay | Admitting: Internal Medicine

## 2023-05-06 ENCOUNTER — Encounter: Payer: Self-pay | Admitting: Internal Medicine

## 2023-05-10 ENCOUNTER — Other Ambulatory Visit: Payer: Self-pay

## 2023-05-10 ENCOUNTER — Other Ambulatory Visit (HOSPITAL_COMMUNITY): Payer: Self-pay

## 2023-05-13 ENCOUNTER — Other Ambulatory Visit (HOSPITAL_COMMUNITY): Payer: Self-pay

## 2023-05-14 DIAGNOSIS — Z1231 Encounter for screening mammogram for malignant neoplasm of breast: Secondary | ICD-10-CM | POA: Diagnosis not present

## 2023-07-09 ENCOUNTER — Ambulatory Visit: Payer: Commercial Managed Care - PPO | Admitting: Family Medicine

## 2023-07-17 ENCOUNTER — Encounter: Payer: Commercial Managed Care - PPO | Admitting: Internal Medicine

## 2023-08-13 DIAGNOSIS — H5213 Myopia, bilateral: Secondary | ICD-10-CM | POA: Diagnosis not present

## 2023-08-22 ENCOUNTER — Other Ambulatory Visit (HOSPITAL_COMMUNITY): Payer: Self-pay

## 2023-08-22 MED ORDER — AZITHROMYCIN 250 MG PO TABS
ORAL_TABLET | ORAL | 0 refills | Status: DC
  Filled 2023-08-22: qty 6, 5d supply, fill #0

## 2023-08-26 ENCOUNTER — Encounter: Payer: Self-pay | Admitting: Internal Medicine

## 2023-08-26 ENCOUNTER — Ambulatory Visit: Admitting: Internal Medicine

## 2023-08-26 ENCOUNTER — Other Ambulatory Visit (HOSPITAL_COMMUNITY): Payer: Self-pay

## 2023-08-26 ENCOUNTER — Other Ambulatory Visit: Payer: Self-pay

## 2023-08-26 VITALS — BP 138/82 | HR 89 | Temp 98.3°F

## 2023-08-26 DIAGNOSIS — K047 Periapical abscess without sinus: Secondary | ICD-10-CM | POA: Insufficient documentation

## 2023-08-26 MED ORDER — AMOXICILLIN-POT CLAVULANATE 875-125 MG PO TABS
1.0000 | ORAL_TABLET | Freq: Two times a day (BID) | ORAL | 0 refills | Status: DC
  Filled 2023-08-26: qty 14, 7d supply, fill #0

## 2023-08-26 NOTE — Patient Instructions (Signed)
 Joy Contreras,  I hope your tooth pain improves! I have sent augmentin 875-125 mg BID for 7 days to your pharmacy. Please contact your dentist to update them on what's going on!  My best,  Dr. Rozelle Corning

## 2023-08-26 NOTE — Progress Notes (Signed)
 Internal Medicine Clinic Attending  Case discussed with the resident at the time of the visit.  We reviewed the resident's history and exam and pertinent patient test results.  I agree with the assessment, diagnosis, and plan of care documented in the resident's note.

## 2023-08-26 NOTE — Progress Notes (Signed)
   CC: tooth infection with pain  HPI:  Joy Contreras is a 48 y.o. female with past medical history as detailed below who presents with tooth pain 2/2 infection. Please see problem based charting for detailed assessment and plan.  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 Dx with Autonomously functioning goiter during pregnancy. anti-TPO negative.    Hypertension    Menorrhagia    Being tx by GYN   Sleep apnea    wears CPAP   Tachycardia    Required BB during pregnancy. Baseline 100-130 at rest   Review of Systems:  Negative unless otherwise stated.  Physical Exam:  Vitals:   08/26/23 1255 08/26/23 1256  BP: (!) 160/93 138/82  Pulse: 84 89  Temp: 98.3 F (36.8 C)   TempSrc: Oral   SpO2: 99%    Constitutional:Well-appearing, in no acute distress. HENT: No drainage or bleeding noted in the mouth. No increased swelling or obvious abscess appreciated. Tooth 14 has a section on anteromedial aspect that appears chipped. Gums are pink. Pulm:Normal work of breathing on room air. Neuro:Alert and oriented x3. No focal deficit noted. Psych:Pleasant mood and affect.  Assessment & Plan:   See Encounters Tab for problem based charting.  Tooth infection Joy Contreras presents with R upper tooth pain (has been told it is tooth 14) that has progressed over the last several days. She was started on azithromycin by her dentist last week and had some mild improvement of the pain initially, however the pain returned and has been present since. She has been taking 800 mg ibuprofen TID and tylenol 500 mg each time she takes ibuprofen with severe pain persisting. She denies fever, chills, drainage from the tooth. She has pain extending from the R cheek area, upward associated with  the tooth pain and a mild headache as well. She is scheduled to have a root canal in May. She does wear a night guard due to teeth grinding which she has not been able to wear every night due to the pain.  On exam there is no obvious abscess, no drainage, no bleeding. Suspect she needs a different antimicrobial agent for this infection until she can be re-evaluated by her dentist. Plan: Will send in amoxicillin-clavulanate 875-125 mg BID x7 days. I have advised the patient to contact her dentist when his office re-opens tomorrow to update him on this medication change and to discuss next steps in management. We discussed minimizing NSAID use as much as possible.  Patient discussed with Dr. Jarvis Mesa

## 2023-08-26 NOTE — Assessment & Plan Note (Signed)
 Joy Contreras presents with R upper tooth pain (has been told it is tooth 14) that has progressed over the last several days. She was started on azithromycin by her dentist last week and had some mild improvement of the pain initially, however the pain returned and has been present since. She has been taking 800 mg ibuprofen TID and tylenol 500 mg each time she takes ibuprofen with severe pain persisting. She denies fever, chills, drainage from the tooth. She has pain extending from the R cheek area, upward associated with the tooth pain and a mild headache as well. She is scheduled to have a root canal in May. She does wear a night guard due to teeth grinding which she has not been able to wear every night due to the pain.  On exam there is no obvious abscess, no drainage, no bleeding. Suspect she needs a different antimicrobial agent for this infection until she can be re-evaluated by her dentist. Plan: Will send in amoxicillin-clavulanate 875-125 mg BID x7 days. I have advised the patient to contact her dentist when his office re-opens tomorrow to update him on this medication change and to discuss next steps in management. We discussed minimizing NSAID use as much as possible.

## 2023-08-28 ENCOUNTER — Encounter: Admitting: Internal Medicine

## 2023-09-09 NOTE — Progress Notes (Unsigned)
 [facility]: Clinic Note  Subjective:  History of Present Illness: Joy Contreras is a 48 y.o. year old female who presents for routine follow up of her chronic medical conditions. I am her new PCP, she saw Dr. Winford Haus on 03/2023.  She continues to have issues with memory loss. She tried the Strattera  that Dr. Winford Haus had prescribed, and it seemed to work well at first. After a few months, the effects seemed to wear off though, so she did not refill it. We discussed referral to [facility] to get more diagnostic information before treating further, and she is interested in this.  She has chronic intermittent left hip & groin pain, which has been worked up in the past. She currently does not have any pain.   She continues to use estradiol  patches, and experiences hot flashes when she doesn't use these.   GERD symptoms have gotten worse over the past year. She now has some dyspepsia, with epigastric pain and more reflux after eating. She has not had an endoscopy or been evaluated for H pylori before.  No chest pain, palpitations, or dizziness.   She never heard from the [facility], but is still interested in talking to them more.  Please refer to Assessment and Plan below for full details in Problem-Based Charting.   Past Medical History:  Patient Active Problem List   Diagnosis Date Noted   Tooth infection 08/26/2023   Memory loss 04/05/2023   CAD (coronary artery disease), native coronary artery 12/21/2022   Lateral epicondylitis 12/21/2022   Elevated blood pressure reading 04/22/2020   Obesity 10/09/2019   Bone lesion 05/11/2019   Iron deficiency anemia 07/21/2015   Health care maintenance 07/21/2015   S/P total hysterectomy and BSO (bilateral salpingo-oophorectomy) 11/01/2014   Vitamin D  deficiency 10/04/2014   Tachycardia 10/16/2011   Fatigue 10/16/2011   Goiter    Insomnia 04/01/2007   GERD 04/29/2006      Medications:   Current Outpatient Medications:    carvedilol  (COREG ) 12.5 MG tablet, Take 2 tablets (25 mg total) by mouth 2 (two) times daily., Disp: 360 tablet, Rfl: 3   estradiol  (CLIMARA  - DOSED IN MG/24 HR) 0.1 mg/24hr patch, Place 1 patch (0.1 mg total) onto the skin once a week., Disp: 12 patch, Rfl: 3   ivabradine  (CORLANOR ) 5 MG TABS tablet, Take 1 tablet (5 mg total) by mouth 2 (two) times daily., Disp: 180 tablet, Rfl: 2   nitroGLYCERIN  (NITROSTAT ) 0.4 MG SL tablet, PLACE 1 TABLET (0.4 MG TOTAL) UNDER THE TONGUE EVERY 5 (FIVE) MINUTES AS NEEDED FOR CHEST PAIN., Disp: 25 tablet, Rfl: 3   pantoprazole  (PROTONIX ) 40 MG tablet, Take 1 tablet (40 mg total) by mouth as needed., Disp: 90 tablet, Rfl: 3   Allergies: No Known Allergies     Objective:   Vitals: Vitals:   09/11/23 0810 09/11/23 0851  BP: 137/81 135/78  Pulse: 66 66  Temp: 98.7 F (37.1 C)   SpO2: 99%      Physical Exam: Physical Exam Constitutional:      Appearance: Normal appearance.  Cardiovascular:     Rate and Rhythm: Normal rate and regular rhythm.  Pulmonary:     Effort: Pulmonary effort is normal.     Breath sounds: Normal breath sounds.  Musculoskeletal:     Cervical back: Neck supple. No tenderness.  Neurological:     Mental Status: She is alert.  Psychiatric:        Mood  and Affect: Mood normal.        Behavior: Behavior normal.      Data: Labs, imaging, and micro were reviewed in Epic. Refer to Assessment and Plan below for full details in Problem-Based Charting.  Assessment & Plan:  GERD - Symptoms have increased over the past year, now with dyspepsia - Will start with H pylori testing. She had coffee this morning, so will get instructions on this from the lab, and come back to test at her convenience - If symptoms persist, I recommend EGD - Continue PPI (on pause for H pylori testing)  Goiter - chronic and stable - TFTs normal in 03/2023 - repeat thyroid  studies at next visit   Tachycardia -  chronic and stable - managed by cardiology - continue carvedilol  & ivabradine  - caution with stimulants if she does have ADD (would want cardiology to approve)  S/P total hysterectomy and BSO (bilateral salpingo-oophorectomy) - continue estradiol  patch   Bone lesion - CT hip in 04/2021 with 1. Redemonstrated mildly expansile lucent and sclerotic bone lesion centered within the midportion of the left inferior pubic ramus measuring up to 2.7 cm. No pathologic fracture. No evidence of extraosseous soft tissue component. This appears stable in size and appearance compared to the previous exams, when accounting for differences in technique. 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 2. No additional lytic or sclerotic bone lesions involving the pelvis or bilateral proximal femur. 3. Mild degenerative changes of the bilateral SI joints and pubic symphysis.  She had seen a sarcoma specialist at [facility] for this and initially had opted not to work up further. Her hip pain comes & goes.   We did shared decision making and opted to start with another hip x-ray. If this shows the lesion is progressing, then will likely re-image with repeat CT scan and send her back to the specialist  Memory loss - Did not improve significantly with a trial of strattera  - Referral to [facility] - Ron Cobbs will call them and will let me know if she needs a referral   Obesity - She will call the [facility] to see if she needs a new referral        Patient will follow up in 6 months or as needed  Josphine Laffey, MD

## 2023-09-11 ENCOUNTER — Other Ambulatory Visit: Payer: Self-pay

## 2023-09-11 ENCOUNTER — Ambulatory Visit: Admitting: Internal Medicine

## 2023-09-11 ENCOUNTER — Encounter: Payer: Self-pay | Admitting: Internal Medicine

## 2023-09-11 VITALS — BP 135/78 | HR 66 | Temp 98.7°F | Ht 64.0 in | Wt 208.0 lb

## 2023-09-11 DIAGNOSIS — R413 Other amnesia: Secondary | ICD-10-CM

## 2023-09-11 DIAGNOSIS — E049 Nontoxic goiter, unspecified: Secondary | ICD-10-CM

## 2023-09-11 DIAGNOSIS — Z6835 Body mass index (BMI) 35.0-35.9, adult: Secondary | ICD-10-CM

## 2023-09-11 DIAGNOSIS — R000 Tachycardia, unspecified: Secondary | ICD-10-CM

## 2023-09-11 DIAGNOSIS — M25552 Pain in left hip: Secondary | ICD-10-CM

## 2023-09-11 DIAGNOSIS — M899 Disorder of bone, unspecified: Secondary | ICD-10-CM

## 2023-09-11 DIAGNOSIS — Z9079 Acquired absence of other genital organ(s): Secondary | ICD-10-CM

## 2023-09-11 DIAGNOSIS — K219 Gastro-esophageal reflux disease without esophagitis: Secondary | ICD-10-CM | POA: Diagnosis not present

## 2023-09-11 DIAGNOSIS — Z780 Asymptomatic menopausal state: Secondary | ICD-10-CM | POA: Diagnosis not present

## 2023-09-11 DIAGNOSIS — E669 Obesity, unspecified: Secondary | ICD-10-CM

## 2023-09-11 DIAGNOSIS — E6609 Other obesity due to excess calories: Secondary | ICD-10-CM

## 2023-09-11 MED ORDER — ESTRADIOL 0.1 MG/24HR TD PTWK
0.1000 mg | MEDICATED_PATCH | TRANSDERMAL | 3 refills | Status: AC
  Filled 2023-09-11 – 2023-10-23 (×2): qty 12, 84d supply, fill #0
  Filled 2024-02-25: qty 12, 84d supply, fill #1

## 2023-09-11 NOTE — Assessment & Plan Note (Signed)
-   chronic and stable - managed by cardiology - continue carvedilol  & ivabradine  - caution with stimulants if she does have ADD (would want cardiology to approve)

## 2023-09-11 NOTE — Patient Instructions (Signed)
 Weight loss service in [place], [place]  Address: [address] [place], [place], [place] [address] Phone: [phone]

## 2023-09-11 NOTE — Assessment & Plan Note (Signed)
-   She will call the [facility] to see if she needs a new referral

## 2023-09-11 NOTE — Assessment & Plan Note (Signed)
-   continue estradiol  patch

## 2023-09-11 NOTE — Assessment & Plan Note (Signed)
-   Did not improve significantly with a trial of strattera  - Referral to [facility] - Ron Cobbs will call them and will let me know if she needs a referral

## 2023-09-11 NOTE — Assessment & Plan Note (Signed)
-   chronic and stable - TFTs normal in 03/2023 - repeat thyroid  studies at next visit

## 2023-09-11 NOTE — Assessment & Plan Note (Signed)
-   CT hip in 04/2021 with 1. Redemonstrated mildly expansile lucent and sclerotic bone lesion centered within the midportion of the left inferior pubic ramus measuring up to 2.7 cm. No pathologic fracture. No evidence of extraosseous soft tissue component. This appears stable in size and appearance compared to the previous exams, when accounting for differences in technique. 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 2. No additional lytic or sclerotic bone lesions involving the pelvis or bilateral proximal femur. 3. Mild degenerative changes of the bilateral SI joints and pubic symphysis.  She had seen a sarcoma specialist at [facility] for this and initially had opted not to work up further. Her hip pain comes & goes.   We did shared decision making and opted to start with another hip x-ray. If this shows the lesion is progressing, then will likely re-image with repeat CT scan and send her back to the specialist

## 2023-09-11 NOTE — Assessment & Plan Note (Signed)
-   Symptoms have increased over the past year, now with dyspepsia - Will start with H pylori testing. She had coffee this morning, so will get instructions on this from the lab, and come back to test at her convenience - If symptoms persist, I recommend EGD - Continue PPI (on pause for H pylori testing)

## 2023-09-23 ENCOUNTER — Other Ambulatory Visit: Payer: Self-pay

## 2023-09-24 ENCOUNTER — Other Ambulatory Visit

## 2023-09-24 DIAGNOSIS — K219 Gastro-esophageal reflux disease without esophagitis: Secondary | ICD-10-CM | POA: Diagnosis not present

## 2023-09-26 LAB — H. PYLORI BREATH TEST: H pylori Breath Test: POSITIVE — AB

## 2023-09-30 ENCOUNTER — Other Ambulatory Visit (HOSPITAL_COMMUNITY): Payer: Self-pay

## 2023-09-30 ENCOUNTER — Ambulatory Visit: Payer: Self-pay | Admitting: Internal Medicine

## 2023-09-30 ENCOUNTER — Other Ambulatory Visit: Payer: Self-pay

## 2023-09-30 MED ORDER — BIS SUBCIT-METRONID-TETRACYC 140-125-125 MG PO CAPS
3.0000 | ORAL_CAPSULE | Freq: Three times a day (TID) | ORAL | 0 refills | Status: DC
  Filled 2023-09-30: qty 120, 10d supply, fill #0

## 2023-09-30 MED ORDER — PANTOPRAZOLE SODIUM 40 MG PO TBEC
40.0000 mg | DELAYED_RELEASE_TABLET | Freq: Two times a day (BID) | ORAL | 0 refills | Status: AC
  Filled 2023-09-30 – 2024-01-08 (×2): qty 20, 10d supply, fill #0

## 2023-09-30 MED ORDER — PANTOPRAZOLE SODIUM 40 MG PO TBEC
40.0000 mg | DELAYED_RELEASE_TABLET | Freq: Two times a day (BID) | ORAL | 0 refills | Status: DC
  Filled 2023-09-30: qty 20, 10d supply, fill #0

## 2023-10-01 ENCOUNTER — Other Ambulatory Visit: Payer: Self-pay

## 2023-10-10 ENCOUNTER — Other Ambulatory Visit: Payer: Self-pay

## 2023-10-11 ENCOUNTER — Other Ambulatory Visit: Payer: Self-pay

## 2023-10-18 ENCOUNTER — Ambulatory Visit (HOSPITAL_COMMUNITY)
Admission: RE | Admit: 2023-10-18 | Discharge: 2023-10-18 | Disposition: A | Discharge status: Home / Self Care | Source: Ambulatory Visit | Attending: Internal Medicine | Admitting: Internal Medicine

## 2023-10-18 DIAGNOSIS — M25552 Pain in left hip: Secondary | ICD-10-CM | POA: Insufficient documentation

## 2023-10-18 DIAGNOSIS — M899 Disorder of bone, unspecified: Secondary | ICD-10-CM | POA: Insufficient documentation

## 2023-10-18 DIAGNOSIS — M898X9 Other specified disorders of bone, unspecified site: Secondary | ICD-10-CM | POA: Diagnosis not present

## 2023-10-18 DIAGNOSIS — G8929 Other chronic pain: Secondary | ICD-10-CM | POA: Diagnosis not present

## 2023-10-23 ENCOUNTER — Other Ambulatory Visit: Payer: Self-pay | Admitting: Internal Medicine

## 2023-10-23 ENCOUNTER — Other Ambulatory Visit: Payer: Self-pay

## 2023-10-23 ENCOUNTER — Other Ambulatory Visit: Payer: Self-pay | Admitting: Cardiovascular Disease

## 2023-10-24 ENCOUNTER — Other Ambulatory Visit: Payer: Self-pay

## 2023-10-24 MED FILL — Carvedilol Tab 12.5 MG: ORAL | 90 days supply | Qty: 360 | Fill #0 | Status: AC

## 2023-10-24 NOTE — Telephone Encounter (Signed)
 Medication sent to pharmacy

## 2023-10-25 ENCOUNTER — Other Ambulatory Visit: Payer: Self-pay

## 2023-10-25 MED ORDER — IVABRADINE HCL 5 MG PO TABS
5.0000 mg | ORAL_TABLET | Freq: Two times a day (BID) | ORAL | 0 refills | Status: DC
  Filled 2023-10-25: qty 60, 30d supply, fill #0

## 2023-10-28 ENCOUNTER — Other Ambulatory Visit (HOSPITAL_COMMUNITY): Payer: Self-pay

## 2023-10-29 ENCOUNTER — Other Ambulatory Visit: Payer: Self-pay

## 2023-10-31 ENCOUNTER — Other Ambulatory Visit: Payer: Self-pay

## 2023-10-31 MED ORDER — AZITHROMYCIN 250 MG PO TABS
ORAL_TABLET | ORAL | 0 refills | Status: AC
  Filled 2023-10-31: qty 6, 5d supply, fill #0

## 2023-11-19 ENCOUNTER — Other Ambulatory Visit: Payer: Self-pay

## 2023-11-19 MED ORDER — AZITHROMYCIN 250 MG PO TABS
ORAL_TABLET | ORAL | 0 refills | Status: AC
  Filled 2023-11-19: qty 6, 5d supply, fill #0

## 2023-11-21 ENCOUNTER — Other Ambulatory Visit: Payer: Self-pay

## 2024-01-07 ENCOUNTER — Ambulatory Visit: Admitting: Student

## 2024-01-07 ENCOUNTER — Other Ambulatory Visit (HOSPITAL_COMMUNITY): Payer: Self-pay

## 2024-01-07 VITALS — BP 139/78 | HR 77 | Temp 100.1°F | Ht 64.0 in | Wt 212.4 lb

## 2024-01-07 DIAGNOSIS — Z9079 Acquired absence of other genital organ(s): Secondary | ICD-10-CM | POA: Diagnosis not present

## 2024-01-07 DIAGNOSIS — Z1231 Encounter for screening mammogram for malignant neoplasm of breast: Secondary | ICD-10-CM

## 2024-01-07 DIAGNOSIS — Z90710 Acquired absence of both cervix and uterus: Secondary | ICD-10-CM | POA: Diagnosis not present

## 2024-01-07 DIAGNOSIS — Z6836 Body mass index (BMI) 36.0-36.9, adult: Secondary | ICD-10-CM

## 2024-01-07 DIAGNOSIS — R413 Other amnesia: Secondary | ICD-10-CM

## 2024-01-07 DIAGNOSIS — Z0000 Encounter for general adult medical examination without abnormal findings: Secondary | ICD-10-CM

## 2024-01-07 DIAGNOSIS — K219 Gastro-esophageal reflux disease without esophagitis: Secondary | ICD-10-CM | POA: Diagnosis not present

## 2024-01-07 DIAGNOSIS — Z90722 Acquired absence of ovaries, bilateral: Secondary | ICD-10-CM

## 2024-01-07 DIAGNOSIS — E049 Nontoxic goiter, unspecified: Secondary | ICD-10-CM

## 2024-01-07 DIAGNOSIS — J069 Acute upper respiratory infection, unspecified: Secondary | ICD-10-CM

## 2024-01-07 DIAGNOSIS — R6889 Other general symptoms and signs: Secondary | ICD-10-CM

## 2024-01-07 DIAGNOSIS — Z13220 Encounter for screening for lipoid disorders: Secondary | ICD-10-CM

## 2024-01-07 DIAGNOSIS — R7303 Prediabetes: Secondary | ICD-10-CM | POA: Diagnosis not present

## 2024-01-07 DIAGNOSIS — E669 Obesity, unspecified: Secondary | ICD-10-CM

## 2024-01-07 DIAGNOSIS — E66812 Obesity, class 2: Secondary | ICD-10-CM

## 2024-01-07 DIAGNOSIS — I10 Essential (primary) hypertension: Secondary | ICD-10-CM

## 2024-01-07 DIAGNOSIS — A048 Other specified bacterial intestinal infections: Secondary | ICD-10-CM

## 2024-01-07 LAB — RESP PANEL BY RT-PCR (FLU A&B, COVID) ARPGX2
Influenza A by PCR: NEGATIVE
Influenza B by PCR: NEGATIVE
SARS Coronavirus 2 by RT PCR: NEGATIVE

## 2024-01-07 LAB — GROUP A STREP BY PCR: Group A Strep by PCR: NOT DETECTED

## 2024-01-07 MED ORDER — AMLODIPINE BESYLATE 5 MG PO TABS
5.0000 mg | ORAL_TABLET | Freq: Every day | ORAL | 11 refills | Status: DC
  Filled 2024-01-07 – 2024-01-08 (×2): qty 30, 30d supply, fill #0
  Filled 2024-03-04: qty 90, 90d supply, fill #1

## 2024-01-07 NOTE — Patient Instructions (Addendum)
 Thank you, Joy Contreras for allowing us  to provide your care today. Today we discussed   H pylori testing - please come back in a week and check the list of medications to avoid for the next week  Memory - we will await the response from the attention center (please let us  know when they reach out to you). But for the momory issues, I will send a referral to neurology as they will do a more comprehensive testing.    Blood pressure: I am sending amlodipine  5 mg. Check your blood pressure with a nurse at [facility] in 2 weeks. Continue Coreg  twice a day and use you cpap daily.   Weight management: referral sent   I have ordered the following labs for you to be done on Tuesday, Sept 2nd: Stop taking your hair and nails supplements now  Lab Orders         Coronavirus (COVID-19) with Influenza A and Influenza B         TSH         T4, Free         T3         Hemoglobin A1c         Lipid Profile      I will call if any are abnormal. All of your labs can be accessed through My Chart.  If you feel worse from your flu-like symptoms let us  know for further testing and evaluation. Use over the counter flue medications for high blood pressure  - Vicks NyQuil High Blood Pressure Cold and Flu Medicine  - Coricidin HPB  My Chart Access: [email or web address]  Please follow-up in: 3 months or earlier if symptoms worsen    We look forward to seeing you next time. Please call our clinic at [phone] if you have any questions or concerns. The best time to call is Monday-Friday from 9am-4pm, but there is someone available 24/7. If after hours or the weekend, call the main hospital number and ask for the Internal Medicine Resident On-Call. If you need medication refills, please notify your pharmacy one week in advance and they will send us  a request.   Thank you for letting us  take part in your care. Wishing you the best!  Joy Ip,  MD 01/07/2024, 8:57 AM [facility]

## 2024-01-07 NOTE — Progress Notes (Unsigned)
 Subjective:  CC: Physical exam  HPI:  Joy Contreras is a 48 y.o. female with a past medical history stated below and presents today for Chronic medical condition follow up. Please see problem based assessment and plan for additional details.  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 Dx with Autonomously functioning goiter during pregnancy. anti-TPO negative.    Hypertension    Menorrhagia    Being tx by GYN   Sleep apnea    wears CPAP   Tachycardia    Required BB during pregnancy. Baseline 100-130 at rest    Current Outpatient Medications on File Prior to Visit  Medication Sig Dispense Refill   carvedilol  (COREG ) 12.5 MG tablet Take 2 tablets (25 mg total) by mouth 2 (two) times daily. 360 tablet 3   estradiol  (CLIMARA  - DOSED IN MG/24 HR) 0.1 mg/24hr patch Place 1 patch (0.1 mg total) onto the skin once a week. 12 patch 3   ivabradine  (CORLANOR ) 5 MG TABS tablet Take 1 tablet (5 mg total) by mouth 2 (two) times daily.Pt must schedule an overdue followup appt with Cardiology for any more refills. [phone] 1st attempt Thank You 60 tablet 0   nitroGLYCERIN  (NITROSTAT ) 0.4 MG SL tablet PLACE 1 TABLET (0.4 MG TOTAL) UNDER THE TONGUE EVERY 5 (FIVE) MINUTES AS NEEDED FOR CHEST PAIN. 25 tablet 3   pantoprazole  (PROTONIX ) 40 MG tablet Take 1 tablet (40 mg total) by mouth 2 (two) times daily for 10 days. 20 tablet 0   No current facility-administered medications on file prior to visit.    Family History  Problem Relation Age of Onset   Hypertension Mother    Bipolar disorder Mother    Seizures Father    Stickler syndrome Sister    Mental illness Brother    Schizophrenia Brother    ADD / ADHD Son    Colon cancer Neg Hx    Esophageal cancer Neg Hx    Rectal  cancer Neg Hx    Stomach cancer Neg Hx    Sleep apnea Neg Hx     Social History   Socioeconomic History   Marital status: Married    Spouse name: Not on file   Number of children: Not on file   Years of education: Not on file   Highest education level: Not on file  Occupational History   Not on file  Tobacco Use   Smoking status: Never   Smokeless tobacco: Never  Vaping Use   Vaping status: Never Used  Substance and Sexual Activity   Alcohol use: Yes    Alcohol/week: 7.0 standard drinks of alcohol    Types: 7 Glasses of wine per week    Comment: occassionally    Drug use: No   Sexual activity: Not on file  Other Topics Concern   Not on file  Social History Narrative   Not on file   Social Drivers of Health   Financial Resource Strain: Not on file  Food Insecurity: No Food Insecurity (08/26/2023)   Hunger Vital Sign    Worried About Running Out of Food in the Last Year: Never true    Ran Out of Food in the Last Year: Never true  Transportation Needs: No Transportation Needs (  08/26/2023)   PRAPARE - Administrator, Civil Service (Medical): No    Lack of Transportation (Non-Medical): No  Physical Activity: Not on file  Stress: Not on file  Social Connections: Socially Integrated (06/11/2022)   Social Connection and Isolation Panel    Frequency of Communication with Friends and Family: More than three times a week    Frequency of Social Gatherings with Friends and Family: More than three times a week    Attends Religious Services: More than 4 times per year    Active Member of [employer] or Organizations: Yes    Attends Banker Meetings: 1 to 4 times per year    Marital Status: Married  Catering manager Violence: Not At Risk (08/26/2023)   Humiliation, Afraid, Rape, and Kick questionnaire    Fear of Current or Ex-Partner: No    Emotionally Abused: No    Physically Abused: No    Sexually Abused: No    Review of Systems: ROS negative except for  what is noted on the assessment and plan.  Objective:   Vitals:   01/07/24 0812 01/07/24 0853  BP: (!) 146/79 139/78  Pulse: 82 77  Temp: 100.1 F (37.8 C)   TempSrc: Oral   SpO2: 100%   Weight: 212 lb 6.4 oz (96.3 kg)   Height: 5' 4 (1.626 m)     Physical Exam: Constitutional: well-appearing woman sitting in chair, in no acute distress HENT: normocephalic atraumatic, mucous membranes moist, mildly erythematous posterior tonsils (visualized), no exudates.  Eyes: mildly erythematous conjunctiva Neck: supple, no LAD or tenderness Cardiovascular: regular rate and rhythm, no m/r/g Pulmonary/Chest: normal work of breathing on room air, lungs clear to auscultation bilaterally Abdominal: soft, non-tender, non-distended MSK: normal bulk and tone Neurological: alert & oriented x 3, normal gait Skin: warm and dry Psych: Pleasant mood and affect      Assessment & Plan:   GERD Patient has completed H pylori treatment since last visit. However, symptoms have recurred and she has been using PPI and other acid suppresants intermittently. The pain feels similar.   Discussed H pylori breath test in ~ one week. Treat if positive. If negative, will refer to GI for EGD.   Goiter Chronic, stable. Currently with flu-like illness. Will defer testing until next week. Last TFTs normal in 2024 - Will follow up TSH, T4, free  S/P total hysterectomy and BSO (bilateral salpingo-oophorectomy) Continues to use Estradiol  patch intermittently. When not in use, vasomotor symptoms worsen with lifelimiting effects.  - Continue - Will continue to monitor and manage cardiovascular risk factors (see below)  Hypertension Persistently elevated readings in office and at home. She is currently on Coreg , which she takes intermittently, but notes BP readings persistently elevated.   Will start on Amlodipine  5 mg daily. Instructed to monitor BP. Will follow up kidney function and A1c, in which case ARB/ACEi may  be indicated.  Memory loss Persistent and progressive short term memory difficulties. She tells me there is a strong maternal family history of early alzheimer's disease. Thus far, iron, thyroid , vitamin D  studies have been within normal range. Patient tried Strattera  before for attention deficit with no improvement in memory concerns, though attention improved.   Joy Contreras is 48 yo and at this point, I favor extended neuro testing. I shared this with Joy Contreras and she agreed. I will refer her to Neurology for urgent assessment.  Obesity Still having difficulty with lifestyle modifications related to stress. Desires help with accountability.  Her weight has increased in 2025. Because of this rapid increase will recheck:  - A1c - Lipid panel - Referral to weight management  URI, acute Patient with 24-hr of rhinorrhea, sinus congestion, sore throat with mild cough, chills, and mildly elevated temperature an no fevers.   Exam not consistent with streptococcal pharyngitis. Tested for COVID and Flu, both negative. Patient desired strep testing, done today, also negative - Supportive treatment     Return in about 2 weeks (around 01/21/2024) for RN for BP check and in 3 months for chronic condition follow up.  Patient discussed with Dr. Lovie Hadassah Kristy Rosario, MD [facility] Medicine Res[facility]  01/08/2024, 5:47 PM

## 2024-01-08 ENCOUNTER — Telehealth: Payer: Self-pay

## 2024-01-08 ENCOUNTER — Other Ambulatory Visit (HOSPITAL_COMMUNITY): Payer: Self-pay

## 2024-01-08 ENCOUNTER — Other Ambulatory Visit: Payer: Self-pay

## 2024-01-08 DIAGNOSIS — I10 Essential (primary) hypertension: Secondary | ICD-10-CM | POA: Insufficient documentation

## 2024-01-08 DIAGNOSIS — Z1231 Encounter for screening mammogram for malignant neoplasm of breast: Secondary | ICD-10-CM

## 2024-01-08 DIAGNOSIS — J069 Acute upper respiratory infection, unspecified: Secondary | ICD-10-CM | POA: Insufficient documentation

## 2024-01-08 NOTE — Assessment & Plan Note (Signed)
 Still having difficulty with lifestyle modifications related to stress. Desires help with accountability. Her weight has increased in 2025. Because of this rapid increase will recheck:  - A1c - Lipid panel - Referral to weight management

## 2024-01-08 NOTE — Assessment & Plan Note (Signed)
 Chronic, stable. Currently with flu-like illness. Will defer testing until next week. Last TFTs normal in 2024 - Will follow up TSH, T4, free

## 2024-01-08 NOTE — Assessment & Plan Note (Signed)
 Patient will be referred to [facility] for screening mammogram

## 2024-01-08 NOTE — Assessment & Plan Note (Signed)
 Persistent and progressive short term memory difficulties. She tells me there is a strong maternal family history of early alzheimer's disease. Thus far, iron, thyroid , vitamin D  studies have been within normal range. Patient tried Strattera  before for attention deficit with no improvement in memory concerns, though attention improved.   Joy Contreras is 48 yo and at this point, I favor extended neuro testing. I shared this with Joy Contreras and she agreed. I will refer her to Neurology for urgent assessment.

## 2024-01-08 NOTE — Assessment & Plan Note (Signed)
 Patient with 24-hr of rhinorrhea, sinus congestion, sore throat with mild cough, chills, and mildly elevated temperature an no fevers.   Exam not consistent with streptococcal pharyngitis. Tested for COVID and Flu, both negative. Patient desired strep testing, done today, also negative - Supportive treatment

## 2024-01-08 NOTE — Assessment & Plan Note (Signed)
 Continues to use Estradiol  patch intermittently. When not in use, vasomotor symptoms worsen with lifelimiting effects.  - Continue - Will continue to monitor and manage cardiovascular risk factors (see below)

## 2024-01-08 NOTE — Assessment & Plan Note (Signed)
 Patient has completed H pylori treatment since last visit. However, symptoms have recurred and she has been using PPI and other acid suppresants intermittently. The pain feels similar.   Discussed H pylori breath test in ~ one week. Treat if positive. If negative, will refer to GI for EGD.

## 2024-01-08 NOTE — Assessment & Plan Note (Signed)
 Persistently elevated readings in office and at home. She is currently on Coreg , which she takes intermittently, but notes BP readings persistently elevated.   Will start on Amlodipine  5 mg daily. Instructed to monitor BP. Will follow up kidney function and A1c, in which case ARB/ACEi may be indicated.

## 2024-01-08 NOTE — Telephone Encounter (Signed)
 Please Place a new External Order as the current order below is to [facility] and the patient uses [facility]. We will reach out to their office to get the patient scheduled.  MM 3D SCREENING MAMMOGRAM BILATERAL BREAST (Order [id number]) Imaging Date: 01/07/2024 Department: [facility] - A Dept Of [place]. [facility] Ordering: [name], MD Authorizing: Lovie Clarity, MD

## 2024-01-09 NOTE — Telephone Encounter (Signed)
 Ordered External screening mammogram as patient prefers [facility] as her testing center

## 2024-01-14 NOTE — Progress Notes (Signed)
 Internal Medicine Clinic Attending  Case discussed with the resident at the time of the visit.  We reviewed the resident's history and exam and pertinent patient test results.  I agree with the assessment, diagnosis, and plan of care documented in the resident's note.    I agree with referral to Neurology for formal cognitive testing. This has been authorized.

## 2024-01-28 ENCOUNTER — Other Ambulatory Visit: Payer: Self-pay | Admitting: *Deleted

## 2024-01-28 ENCOUNTER — Other Ambulatory Visit

## 2024-01-28 DIAGNOSIS — E66812 Obesity, class 2: Secondary | ICD-10-CM

## 2024-01-28 DIAGNOSIS — E049 Nontoxic goiter, unspecified: Secondary | ICD-10-CM | POA: Diagnosis not present

## 2024-01-28 DIAGNOSIS — R7303 Prediabetes: Secondary | ICD-10-CM

## 2024-01-28 DIAGNOSIS — Z13220 Encounter for screening for lipoid disorders: Secondary | ICD-10-CM | POA: Diagnosis not present

## 2024-01-28 DIAGNOSIS — Z6835 Body mass index (BMI) 35.0-35.9, adult: Secondary | ICD-10-CM | POA: Diagnosis not present

## 2024-01-28 DIAGNOSIS — A048 Other specified bacterial intestinal infections: Secondary | ICD-10-CM

## 2024-01-28 DIAGNOSIS — E6609 Other obesity due to excess calories: Secondary | ICD-10-CM | POA: Diagnosis not present

## 2024-01-29 ENCOUNTER — Ambulatory Visit: Payer: Self-pay | Admitting: Student

## 2024-01-29 LAB — HEMOGLOBIN A1C
Est. average glucose Bld gHb Est-mCnc: 120 mg/dL
Hgb A1c MFr Bld: 5.8 % — ABNORMAL HIGH (ref 4.8–5.6)

## 2024-01-29 LAB — LIPID PANEL
Chol/HDL Ratio: 3.3 ratio (ref 0.0–4.4)
Cholesterol, Total: 212 mg/dL — ABNORMAL HIGH (ref 100–199)
HDL: 65 mg/dL (ref >39)
LDL Chol Calc (NIH): 127 mg/dL — ABNORMAL HIGH (ref 0–99)
Triglycerides: 114 mg/dL (ref 0–149)
VLDL Cholesterol Cal: 20 mg/dL (ref 5–40)

## 2024-01-29 LAB — H. PYLORI BREATH TEST: H pylori Breath Test: NEGATIVE

## 2024-01-29 LAB — TSH: TSH: 0.765 u[IU]/mL (ref 0.450–4.500)

## 2024-01-29 LAB — T4, FREE: Free T4: 1.05 ng/dL (ref 0.82–1.77)

## 2024-01-29 LAB — T3: T3, Total: 140 ng/dL (ref 71–180)

## 2024-01-29 NOTE — Progress Notes (Signed)
 Discussed with patient. The 10-year ASCVD risk score (Arnett DK, et al., 2019) is: 3.4%   Values used to calculate the score:     Age: 49 years     Clincally relevant sex: Female     Is Non-Hispanic African American: Yes     Diabetic: No     Tobacco smoker: No     Systolic Blood Pressure: 139 mmHg     Is BP treated: Yes     HDL Cholesterol: 65 mg/dL     Total Cholesterol: 212 mg/dL  Will need lifestyle modifications; [facility]. Awaiting neurology appointment. In the process of getting attention deficit assessment. No other changes in plan.  Normal TSH and Ft4. Will follow up H pylori testing.

## 2024-02-04 ENCOUNTER — Ambulatory Visit: Payer: Self-pay | Admitting: Internal Medicine

## 2024-02-09 DIAGNOSIS — Z0289 Encounter for other administrative examinations: Secondary | ICD-10-CM

## 2024-02-10 ENCOUNTER — Ambulatory Visit (INDEPENDENT_AMBULATORY_CARE_PROVIDER_SITE_OTHER): Admitting: Nurse Practitioner

## 2024-02-10 ENCOUNTER — Encounter (INDEPENDENT_AMBULATORY_CARE_PROVIDER_SITE_OTHER): Payer: Self-pay | Admitting: Nurse Practitioner

## 2024-02-10 VITALS — BP 134/82 | HR 74 | Temp 98.4°F | Ht 63.0 in | Wt 209.0 lb

## 2024-02-10 DIAGNOSIS — Z6837 Body mass index (BMI) 37.0-37.9, adult: Secondary | ICD-10-CM

## 2024-02-10 DIAGNOSIS — E7800 Pure hypercholesterolemia, unspecified: Secondary | ICD-10-CM | POA: Diagnosis not present

## 2024-02-10 DIAGNOSIS — E66812 Obesity, class 2: Secondary | ICD-10-CM

## 2024-02-10 DIAGNOSIS — G4733 Obstructive sleep apnea (adult) (pediatric): Secondary | ICD-10-CM | POA: Diagnosis not present

## 2024-02-10 DIAGNOSIS — E559 Vitamin D deficiency, unspecified: Secondary | ICD-10-CM

## 2024-02-10 DIAGNOSIS — I2510 Atherosclerotic heart disease of native coronary artery without angina pectoris: Secondary | ICD-10-CM

## 2024-02-10 DIAGNOSIS — K219 Gastro-esophageal reflux disease without esophagitis: Secondary | ICD-10-CM

## 2024-02-10 DIAGNOSIS — I10 Essential (primary) hypertension: Secondary | ICD-10-CM | POA: Diagnosis not present

## 2024-02-10 NOTE — Progress Notes (Signed)
 [address] [place], [place], [place] [address] Office: [phone]  /  Fax: [phone]   Initial Consultation    Joy Contreras was seen in clinic today to evaluate for obesity. She is interested in losing weight to improve overall health and reduce the risk of weight related complications. She presents today to review program treatment options, initial physical assessment, and evaluation.    She does have sleep apnea with CPAP- last used 6 months ago.  2D echo was completed 12/2018 with EF of 45-50%, mild LVE/LVH. She is seen through cardiology for tachycardia and hypertension.  She is currently on Carvedilol  12.5 mg 2 tabs bid, Ivabradine  HCL 5 mg BID and amlodipine  5 mg every day. BP's are currently well controlled as well as pulse BP Readings from Last 3 Encounters:  02/10/24 134/82  01/07/24 139/78  09/11/23 135/78   Pulse Readings from Last 3 Encounters:  02/10/24 74  01/07/24 77  09/11/23 66   She also has GERD which is primarily controlled through diet and behavior modification. She does Take Protonix  40 mg PRN.  She has Vitamin D  deficiency but is not currently on supplementation.   Zhuri first noticed weight gain after the birth of her children.  She noticed weight increased again with menopause after hysterectomy. She was on GLP1 in the past for 6-7 weeks and did notice weight loss but insurance coverage was stopped so could no longer use the medication.  She also use Contrave in the past but caused constipation and stopped. She does have plantar fasciitis which limits activity when it flares.   Anthropometrics and Bioimpedance Analysis   Body mass index is 37.02 kg/m. Body Fat Mass : 46 % Visceral Fat Mass Rating : 12   Obesity Related Diseases and Complications  Obesity Quality of Life and Psychosocial Complications: Depression and / or anxiety, Body image dissatisfaction, Reduced health-related quality of life, and Decrease physical activity and social  participation  Cardiometabolic: Dyslipidemia or hypercholesterolemia, Hypertension, DOE, and Fatigue  Biomechanical: Obstructive sleep apnea and GERD   Weight Related History  She was referred by: PCP  When asked what they would like to accomplish? She states: Adopt a healthier eating pattern and lifestyle, Improve energy levels and physical activity, Improve existing medical conditions, Improve quality of life, Improve appearance, and Improve self-confidence  Weight history: After pregnancy and hysterectomy.   Highest weight: 215  Contributing factors: family history of obesity, disruption of circadian rhythm / sleep disordered breathing, consumption of processed foods, use of obesogenic medications: Beta-blockers, moderate to high levels of stress, reduced physical activity, chronic skipping of meals, menopause, multiple weight loss attempts in the past, sedentary job, and need for convenient foods  Prior weight loss attempts: Weight Watchers  Current or previous pharmacotherapy: GLP-1, contrave- caused constipation  Response to medication: Was cost prohibitive or lost coverage for AOM  Current nutrition plan: None  Greatest challenge with dieting: meal preparation and cooking.  Current level of physical activity: None  Barriers to Exercise: energy, orthopedic problems, and feels more shortness of breath with exertion  Readiness and Motivation  On a scale from 0 to 10 How ready are you to make changes to your eating and physical activity to lose weight? 9 How important is it for you to lose weight right now ? 9 How confident are you that you can lose weight if you try? 8  Past Medical History   Past Medical History:  Diagnosis Date   Blood transfusion without reported diagnosis  Depression    History of Depression   Dysrhythmia    tachycardia   GERD (gastroesophageal reflux disease)    Tx with PPI   Goiter 05/14/2004   Dr Altheimer. Dx with Autonomously  functioning goiter during pregnancy. anti-TPO negative.    Hypertension    Menorrhagia    Being tx by GYN   Sleep apnea    wears CPAP   Tachycardia    Required BB during pregnancy. Baseline 100-130 at rest     Objective    BP 134/82   Pulse 74   Temp 98.4 F (36.9 C)   Ht 5' 3 (1.6 m)   Wt 209 lb (94.8 kg)   LMP 02/02/2013   SpO2 99%   BMI 37.02 kg/m  She was weighed on the bioimpedance scale: Body mass index is 37.02 kg/m.    General:  Alert, oriented and cooperative. Patient is in no acute distress.  Respiratory: Normal respiratory effort, no problems with respiration noted   Gait: able to ambulate independently  Mental Status: Normal mood and affect. Normal behavior. Normal judgment and thought content.   Diagnostic Data Reviewed  BMET    Component Value Date/Time   NA 140 04/05/2023 1218   K 4.4 04/05/2023 1218   CL 104 04/05/2023 1218   CO2 25 04/05/2023 1218   GLUCOSE 93 04/05/2023 1218   GLUCOSE 107 (H) 12/21/2016 0900   BUN 9 04/05/2023 1218   CREATININE 0.71 04/05/2023 1218   CREATININE 0.75 02/16/2014 1102   CALCIUM 9.3 04/05/2023 1218   GFRNONAA 112 10/08/2019 1608   GFRNONAA >89 02/16/2014 1102   GFRAA 129 10/08/2019 1608   GFRAA >89 02/16/2014 1102   Lab Results  Component Value Date   HGBA1C 5.8 (H) 01/28/2024   HGBA1C 5.6 07/05/2016   CBC    Component Value Date/Time   WBC 5.2 04/05/2023 1218   WBC 6.3 12/21/2016 0900   RBC 4.70 04/05/2023 1218   RBC 4.81 12/21/2016 0900   HGB 13.5 04/05/2023 1218   HCT 41.7 04/05/2023 1218   PLT 228 04/05/2023 1218   MCV 89 04/05/2023 1218   MCH 28.7 04/05/2023 1218   MCH 29.3 12/21/2016 0900   MCHC 32.4 04/05/2023 1218   MCHC 33.3 12/21/2016 0900   RDW 12.4 04/05/2023 1218   Iron/TIBC/Ferritin/ %Sat    Component Value Date/Time   IRON 56 02/16/2014 1102   FERRITIN 121 10/08/2019 1608   Lipid Panel     Component Value Date/Time   CHOL 212 (H) 01/28/2024 0000   TRIG 114 01/28/2024 0000    HDL 65 01/28/2024 0000   CHOLHDL 3.3 01/28/2024 0000   CHOLHDL 2.6 10/16/2011 1148   VLDL 19 10/16/2011 1148   LDLCALC 127 (H) 01/28/2024 0000   Hepatic Function Panel     Component Value Date/Time   PROT 6.9 04/05/2023 1218   ALBUMIN 4.1 04/05/2023 1218   AST 25 04/05/2023 1218   ALT 25 04/05/2023 1218   ALKPHOS 119 04/05/2023 1218   BILITOT 0.3 04/05/2023 1218      Component Value Date/Time   TSH 0.765 01/28/2024 0000    Medications  Outpatient Encounter Medications as of 02/10/2024  Medication Sig   amLODipine  (NORVASC ) 5 MG tablet Take 1 tablet (5 mg total) by mouth daily.   carvedilol  (COREG ) 12.5 MG tablet Take 2 tablets (25 mg total) by mouth 2 (two) times daily.   estradiol  (CLIMARA  - DOSED IN MG/24 HR) 0.1 mg/24hr patch Place 1 patch (0.1 mg  total) onto the skin once a week.   ivabradine  (CORLANOR ) 5 MG TABS tablet Take 1 tablet (5 mg total) by mouth 2 (two) times daily.Pt must schedule an overdue followup appt with Cardiology for any more refills. [phone] 1st attempt Thank You   nitroGLYCERIN  (NITROSTAT ) 0.4 MG SL tablet PLACE 1 TABLET (0.4 MG TOTAL) UNDER THE TONGUE EVERY 5 (FIVE) MINUTES AS NEEDED FOR CHEST PAIN.   pantoprazole  (PROTONIX ) 40 MG tablet Take 1 tablet (40 mg total) by mouth 2 (two) times daily for 10 days.   No facility-administered encounter medications on file as of 02/10/2024.     Assessment and Plan   Primary hypertension  Gastroesophageal reflux disease without esophagitis  Vitamin D  deficiency  Mild CAD, coronary artery calcium score 0- CT 04/06/20  Pure hypercholesterolemia  OSA on CPAP  Class 2 severe obesity with serious comorbidity and body mass index (BMI) of 37.0 to 37.9 in adult, unspecified obesity type       Obesity Treatment and Action Plan:  Patient will work on garnering support from family and friends to begin weight loss journey. Will work on eliminating or reducing the presence of highly palatable, calorie  dense foods in the home. Will complete provided nutritional and psychosocial assessment questionnaire before the next appointment. Will be scheduled for indirect calorimetry to determine resting energy expenditure in a fasting state.  This will allow us  to create a reduced calorie, high-protein meal plan to promote loss of fat mass while preserving muscle mass. Counseled on the health benefits of losing 5%-15% of total body weight. Was counseled on nutritional approaches to weight loss and benefits of reducing processed foods and consuming plant-based foods and high quality protein as part of nutritional weight management. Was counseled on pharmacotherapy and role as an adjunct in weight management.   Education and Additional resources  She was weighed on the bioimpedance scale and results were discussed and documented in the synopsis.  We discussed obesity as a progressive, chronic disease and the importance of a more detailed evaluation of all the factors contributing to the disease.  We reviewed the basic principles in obesity management.   We discussed the importance of long term lifestyle changes which include nutrition, exercise and behavioral modification as well as the importance of customizing this to her specific health and social needs.  We reviewed the role of medical interventions including pharmacotherapy and surgical interventions.   We discussed the benefits of reaching a healthier weight to alleviate the symptoms of existing conditions and reduce the risks of the biomechanical, cardiometabolic and psychological effects of obesity.  We reviewed our program approach and philosophy, which are guided by the four pillars of obesity medicine.  We discussed how to prepare for intake appointment and the importance of fasting and avoidance of stimulants for at least 8 hours prior to indirect calorimetry.  K[place] C Woolstenhulme appears to be in the action stage of change and reports  being ready to initiate intensive lifestyle and behavioral modifications as part of their weight loss journey.  Attestation  Reviewed by clinician on day of visit: allergies, medications, problem list, medical history, surgical history, family history, social history, and previous encounter notes pertinent to obesity diagnosis.  I have spent 40 minutes in the care of the patient today including: 5 minutes before the visit reviewing and preparing the chart. 19 minutes face-to-face assessing and reviewing listed medical problems as outlined in obesity care plan, providing nutritional and behavioral counseling on topics outlined in the  obesity care plan, independently interpreting test results and goals of care, as described in assessment and plan, reviewing and discussing biometric information and progress, and reviewing latest PCP notes and specialist consultations 16 minutes after the visit updating chart and documentation of encounter.

## 2024-02-25 ENCOUNTER — Other Ambulatory Visit: Payer: Self-pay | Admitting: Cardiovascular Disease

## 2024-02-25 ENCOUNTER — Other Ambulatory Visit: Payer: Self-pay

## 2024-02-27 ENCOUNTER — Other Ambulatory Visit: Payer: Self-pay

## 2024-02-27 MED ORDER — IVABRADINE HCL 5 MG PO TABS
5.0000 mg | ORAL_TABLET | Freq: Two times a day (BID) | ORAL | 0 refills | Status: DC
  Filled 2024-02-27: qty 30, 15d supply, fill #0

## 2024-02-28 ENCOUNTER — Other Ambulatory Visit: Payer: Self-pay

## 2024-03-03 NOTE — Progress Notes (Signed)
 " Cardiology Office Note   Date:  03/06/2024  ID:  Joy Contreras, DOB June 20, 1975, MRN [id number] PCP: Shawn Sick, MD  [facility] Providers Cardiologist:  Delon JAYSON Hoover, NP     History of Present Illness Joy Contreras is a 48 y.o. female with a past medical history of nonobstructive CAD, hypertension, GERD, OSA on CPAP, iron deficiency anemia  05/04/2022 echo EF 55 to 60%, no RWMA, trivial MR 04/06/2020 coronary CTA no calcified plaque, mild nonobstructive soft plaque in the distal RCA as well as minimal nonobstructive plaque in the LAD 05/29/2019 Limited echo EF 50 to 55%, positive RWMA 04/03/2019 cardiac MRI mild MR, no delayed gadolinium uptake 01/02/2019 echo EF 45 to 50%, abnormal septal motion in the inferior basal segment, mild to moderate mitral annular calcification, mild thickening of the aortic valve 11/20/2017 monitor sinus tachycardia with heart rate ranging from 100 to 140 bpm  She initially established care with Dr. Delford in 2020 for evaluation and management of tachycardia, all secondary causes have been addressed and resolved this was still quite bothersome for her.  She had worn a monitor in 2019 revealing her heart rate was always greater than 100 with no nocturnal dip.  Metanephrines were negative.  Echo at that time revealed an EF of 45 to 50% with abnormal septal motion in the inferior basal segment cardiac MRI revealed>> no delayed gadolinium uptake.  In 2021 she was evaluated for chest pain of uncertain etiology, coronary CTA was arranged revealing a calcium  score of 0 however she did have soft plaque that was nonobstructive.    Most recently she was evaluated by Jackee Alberts, NP on 04/04/2022, she was okay at this time but she did have some episodes of shortness of breath so an echocardiogram was arranged which was overall improved and felt to be noncontributory to her symptoms rather likely a component of deconditioning, then she was advised she  could follow-up in 3 months.  She presents today for follow-up, is doing well overall, no formal complaints from a cardiac perspective.  She was recently evaluated by her PCP, lab work obtained revealed an elevated LDL at 127, and she has plans to meet with healthy weight and wellness to help augment lifestyle changes.  She works, stays active between work and her family but is not participating in any formal exercise. She denies chest pain, palpitations, dyspnea, pnd, orthopnea, n, v, dizziness, syncope, edema, weight gain, or early satiety.    ROS: Review of Systems  All other systems reviewed and are negative.    Studies Reviewed EKG Interpretation Date/Time:  Friday March 06 2024 13:23:19 EDT Ventricular Rate:  62 PR Interval:  174 QRS Duration:  92 QT Interval:  440 QTC Calculation: 446 R Axis:   -23  Text Interpretation: Normal sinus rhythm Normal ECG When compared with ECG of 11-Mar-2020 12:19, QRS axis Shifted right Confirmed by Hoover Delon [phone]) on 03/06/2024 1:28:33 PM    Cardiac Studies & Procedures   ______________________________________________________________________________________________     ECHOCARDIOGRAM  ECHOCARDIOGRAM COMPLETE 05/04/2022  Narrative ECHOCARDIOGRAM REPORT    Patient Name:   Joy Contreras Date of Exam: 05/04/2022 Medical Rec #:  [id number]          Height:       63.5 in Accession #:    [id number]         Weight:       208.0 lb Date of Birth:  [birth year]  BSA:          1.978 m Patient Age:    46 years           BP:           130/78 mmHg Patient Gender: F                  HR:           72 bpm. Exam Location:  [address]  Procedure: 2D Echo, Color Doppler, Cardiac Doppler, Intracardiac Opacification Agent, 3D Echo and Strain Analysis  Indications:    Heart failure with improved ejection fraction  History:        Patient has prior history of Echocardiogram examinations, most recent 05/29/2019. Risk  Factors:Hypertension.  Sonographer:    Augustin Seals RDCS Referring Phys: [phone] JACKEE VEAR RADDLE DICK  IMPRESSIONS   1. Left ventricular ejection fraction, by estimation, is 55 to 60%. The left ventricle has normal function. The left ventricle has no regional wall motion abnormalities. Left ventricular diastolic parameters were normal. 2. Right ventricular systolic function is normal. The right ventricular size is normal. 3. The mitral valve is normal in structure. Trivial mitral valve regurgitation. No evidence of mitral stenosis. 4. The aortic valve is normal in structure. Aortic valve regurgitation is not visualized. No aortic stenosis is present. 5. The inferior vena cava is normal in size with greater than 50% respiratory variability, suggesting right atrial pressure of 3 mmHg.  Comparison(s): No significant change from prior study.  FINDINGS Left Ventricle: Left ventricular ejection fraction, by estimation, is 55 to 60%. The left ventricle has normal function. The left ventricle has no regional wall motion abnormalities. The left ventricular internal cavity size was normal in size. There is no left ventricular hypertrophy. Left ventricular diastolic parameters were normal.  Right Ventricle: The right ventricular size is normal. Right ventricular systolic function is normal.  Left Atrium: Left atrial size was normal in size.  Right Atrium: Right atrial size was normal in size.  Pericardium: There is no evidence of pericardial effusion.  Mitral Valve: The mitral valve is normal in structure. Trivial mitral valve regurgitation. No evidence of mitral valve stenosis.  Tricuspid Valve: The tricuspid valve is normal in structure. Tricuspid valve regurgitation is trivial. No evidence of tricuspid stenosis.  Aortic Valve: The aortic valve is normal in structure. Aortic valve regurgitation is not visualized. No aortic stenosis is present.  Pulmonic Valve: The pulmonic valve was normal  in structure. Pulmonic valve regurgitation is not visualized. No evidence of pulmonic stenosis.  Aorta: The aortic root is normal in size and structure.  Venous: The inferior vena cava is normal in size with greater than 50% respiratory variability, suggesting right atrial pressure of 3 mmHg.  IAS/Shunts: No atrial level shunt detected by color flow Doppler.   LEFT VENTRICLE PLAX 2D LVIDd:         4.60 cm   Diastology LVIDs:         3.20 cm   LV e' medial:    8.49 cm/s LV PW:         1.00 cm   LV E/e' medial:  10.2 LV IVS:        0.80 cm   LV e' lateral:   11.90 cm/s LVOT diam:     2.00 cm   LV E/e' lateral: 7.3 LV SV:         44 LV SV Index:   22 LVOT Area:     3.14  cm  3D Volume EF: 3D EF:        58 % LV EDV:       115 ml LV ESV:       49 ml LV SV:        67 ml  RIGHT VENTRICLE RV Basal diam:  3.50 cm RV Mid diam:    2.80 cm RV S prime:     10.60 cm/s TAPSE (M-mode): 2.0 cm  LEFT ATRIUM             Index        RIGHT ATRIUM           Index LA diam:        3.70 cm 1.87 cm/m   RA Area:     14.60 cm LA Vol (A2C):   58.8 ml 29.73 ml/m  RA Volume:   35.70 ml  18.05 ml/m LA Vol (A4C):   50.8 ml 25.69 ml/m LA Biplane Vol: 55.8 ml 28.21 ml/m AORTIC VALVE LVOT Vmax:   72.25 cm/s LVOT Vmean:  49.500 cm/s LVOT VTI:    0.142 m  AORTA Ao Root diam: 3.00 cm Ao Asc diam:  3.10 cm  MITRAL VALVE MV Area (PHT): 5.13 cm    SHUNTS MV Decel Time: 148 msec    Systemic VTI:  0.14 m MV E velocity: 87.00 cm/s  Systemic Diam: 2.00 cm MV A velocity: 66.00 cm/s MV E/A ratio:  1.32  Redell Shallow MD Electronically signed by Redell Shallow MD Signature Date/Time: 05/04/2022/2:09:37 PM    Final    MONITORS  CARDIAC EVENT MONITOR 11/20/2017  Narrative  Normal sinus rhythm, sinus tachycardia with heart rate ranging from 100 to 140 bpm.  Patient with PVCs and trigeminal PVCs   CT SCANS  CT CORONARY MORPH W/CTA COR W/SCORE 04/06/2020  Addendum 04/11/2020  8:45  AM ADDENDUM REPORT: 04/11/2020 08:43  CLINICAL DATA:  48 Year old African American Female  EXAM: Cardiac/Coronary  CTA  TECHNIQUE: The patient was scanned on a [employer].  FINDINGS: A 100 kV prospective scan was triggered in the descending thoracic aorta at 111 HU's. Axial non-contrast 3 mm slices were carried out through the heart. The data set was analyzed on a dedicated work station and scored using the Agatson method. Gantry rotation speed was 250 msecs and collimation was .6 mm. No beta blockade and 0.8 mg of sl NTG was given. The 3D data set was reconstructed in 5% intervals of the 67-82 % of the R-R cycle. Diastolic phases were analyzed on a dedicated work station using MPR, MIP and VRT modes. The patient received 80 cc of contrast.  Aorta:  Normal size.  No calcifications.  No dissection.  Aortic Valve:  Tri-leaflet.  No calcifications.  Coronary Arteries:  Normal coronary origin.  Right dominance.  Coronary calcium  score of 0. This was 1st percentile for age, sex, and race matched control.  RCA is a large dominant artery that gives rise to PDA and PLA. There is a mild, non-obstructive (25-49%) soft plaque in the distal vessel.  Left main is a large artery that gives rise to LAD and LCX arteries.  LAD is a large vessel that gives rise to one large D1 branch. There a minimal, non-obstructive (1-24%) narrowing in the mid vessel.  LCX is a non-dominant artery that gives rise to one large OM1 branch. There is no plaque.  Other findings:  Normal pulmonary vein drainage into the left atrium.  Normal left atrial appendage  without a thrombus.  Normal size of the pulmonary artery.  Extra-cardiac findings: See attached radiology report for non-cardiac structures.  IMPRESSION: 1. Coronary calcium  score of 0. This was 1st percentile for age, sex, and race matched control.  2. Normal coronary origin.  Right dominance.  3. CAD-RADS 2. Mild  non-obstructive CAD (25-49%). Consider non-atherosclerotic causes of chest pain. Consider preventive therapy and risk factor modification.   Electronically Signed By: Stanly Leavens MD On: 04/11/2020 08:43  Narrative EXAM: OVER-READ INTERPRETATION  CT CHEST  The following report is an over-read performed by radiologist Dr. Franky Crease of [facility] on 04/06/2020. This over-read does not include interpretation of cardiac or coronary anatomy or pathology. The coronary CTA interpretation by the cardiologist is attached.  COMPARISON:  None.  FINDINGS: Vascular: Heart is normal size.  Aorta normal caliber.  Mediastinum/Nodes: No adenopathy.  Lungs/Pleura: Areas of scarring or atelectasis in the lung bases. No effusions.  Upper Abdomen: Imaging into the upper abdomen demonstrates no acute findings.  Musculoskeletal: Chest wall soft tissues are unremarkable. No acute bony abnormality.  IMPRESSION: Bibasilar scarring or atelectasis.  No acute extra cardiac abnormality  Electronically Signed: By: Franky Crease M.D. On: 04/06/2020 14:50   CARDIAC MRI  MR CARDIAC MORPHOLOGY W WO CONTRAST 04/03/2019  Narrative CLINICAL DATA:  Cardiomyopathy  EXAM: CARDIAC MRI  TECHNIQUE: The patient was scanned on a 1.5 Tesla GE magnet. A dedicated cardiac coil was used. Functional imaging was done using [employer] sequences. 2,3, and 4 chamber views were done to assess for RWMA's. Modified Simpson's rule using a short axis stack was used to calculate an ejection fraction on a dedicated work [employer]. The patient received 12 cc of Multihance. After 10 minutes inversion recovery sequences were used to assess for infiltration and scar tissue.  FINDINGS: Normal chamber sizes. Normal aortic root 2.8 cm. No ASD/PFO. Mild appearing MR. Normal TV and AV tri-leaflet. No effusion. Normal RV size and function. Septal thickness 10 mm Low normal EF 53%  (EDV 87 ESV 41 cc SV 46 cc) No hyper-enhancement on delayed inversion recovery gadolinium images  IMPRESSION: 1. Normal chamber sizes  2.  Normal RV size and function  3. Low normal EF 53% with normal septal thickness 10 mm and no RWMA;s  4.  No delayed gadolinium uptake  5.  Mild appearing MR  6.  No effusion  7.  Normal aortic root 2.8 cm  Maude Emmer   Electronically Signed By: Maude Emmer M.D. On: 04/03/2019 11:28   ______________________________________________________________________________________________      Risk Assessment/Calculations           Physical Exam VS:  BP 124/70   Pulse 62   Ht 5' 3 (1.6 m)   Wt 213 lb (96.6 kg)   LMP 02/02/2013   SpO2 99%   BMI 37.73 kg/m        Wt Readings from Last 3 Encounters:  03/06/24 213 lb (96.6 kg)  02/10/24 209 lb (94.8 kg)  01/07/24 212 lb 6.4 oz (96.3 kg)    GEN: Well nourished, well developed in no acute distress NECK: No JVD; No carotid bruits CARDIAC: RRR, no murmurs, rubs, gallops RESPIRATORY:  Clear to auscultation without rales, wheezing or rhonchi  ABDOMEN: Soft, non-tender, non-distended EXTREMITIES:  No edema; No deformity   ASSESSMENT AND PLAN CAD -calcium  score of 0 in 2021 however soft plaque was present in the distal RCA as well as minimal nonobstructive plaque in the LAD.  No indication for aspirin at this time but she should be on lipid-lowering medications.  Will check LP(a).  Tachycardia -heart rate is controlled today 62 beats a minute, continue Corlanor  5 mg twice daily, Coreg  12.5 mg twice daily.  Dyslipidemia-LDL 127, will check LP(a), even if her LP(a) is normal, she needs to be on lipid-lowering medication as she had the presence of soft plaque on her coronary CTA.  We discussed Crestor  versus pravastatin, we will wait to see what her LP(a) is before make a final decision.  Hypertension-blood pressure is well-controlled at 124/70, continue Norvasc  5 mg daily, Coreg  12.5 mg  twice daily.        Dispo: LP(a), follow-up in 1 year.  Signed, Delon JAYSON Hoover, NP  "

## 2024-03-04 ENCOUNTER — Other Ambulatory Visit: Payer: Self-pay

## 2024-03-06 ENCOUNTER — Ambulatory Visit: Attending: Cardiology | Admitting: Cardiology

## 2024-03-06 ENCOUNTER — Other Ambulatory Visit: Payer: Self-pay

## 2024-03-06 ENCOUNTER — Telehealth: Payer: Self-pay

## 2024-03-06 ENCOUNTER — Encounter: Payer: Self-pay | Admitting: Cardiology

## 2024-03-06 ENCOUNTER — Ambulatory Visit: Admitting: Cardiology

## 2024-03-06 VITALS — BP 124/70 | HR 62 | Ht 63.0 in | Wt 213.0 lb

## 2024-03-06 DIAGNOSIS — I2510 Atherosclerotic heart disease of native coronary artery without angina pectoris: Secondary | ICD-10-CM | POA: Diagnosis not present

## 2024-03-06 DIAGNOSIS — I10 Essential (primary) hypertension: Secondary | ICD-10-CM

## 2024-03-06 DIAGNOSIS — Z79899 Other long term (current) drug therapy: Secondary | ICD-10-CM

## 2024-03-06 DIAGNOSIS — E782 Mixed hyperlipidemia: Secondary | ICD-10-CM

## 2024-03-06 DIAGNOSIS — R000 Tachycardia, unspecified: Secondary | ICD-10-CM

## 2024-03-06 NOTE — Telephone Encounter (Signed)
 Pt here to obtain past surgical dates  Needed for upcoming appt at another office. Dates given No further action needed.Joy Contreras

## 2024-03-06 NOTE — Patient Instructions (Signed)
 Medication Instructions:  Your physician recommends that you continue on your current medications as directed. Please refer to the Current Medication list given to you today.  *If you need a refill on your cardiac medications before your next appointment, please call your pharmacy*  Lab Work: LPA Today If you have labs (blood work) drawn today and your tests are completely normal, you will receive your results only by: MyChart Message (if you have MyChart) OR A paper copy in the mail If you have any lab test that is abnormal or we need to change your treatment, we will call you to review the results.  Testing/Procedures: NONE  Follow-Up: At [facility], you and your health needs are our priority.  As part of our continuing mission to provide you with exceptional heart care, our providers are all part of one team.  This team includes your primary Cardiologist (physician) and Advanced Practice Providers or APPs (Physician Assistants and Nurse Practitioners) who all work together to provide you with the care you need, when you need it.  Your next appointment:   1 year(s)  Provider:   Maude Emmer, MD Or Delon Hoover, NP

## 2024-03-09 ENCOUNTER — Other Ambulatory Visit: Payer: Self-pay

## 2024-03-09 ENCOUNTER — Ambulatory Visit: Payer: Self-pay | Admitting: Cardiology

## 2024-03-09 DIAGNOSIS — E782 Mixed hyperlipidemia: Secondary | ICD-10-CM

## 2024-03-09 DIAGNOSIS — I2510 Atherosclerotic heart disease of native coronary artery without angina pectoris: Secondary | ICD-10-CM

## 2024-03-09 LAB — LIPOPROTEIN A (LPA): Lipoprotein (a): 77.6 nmol/L — ABNORMAL HIGH

## 2024-03-09 MED ORDER — ROSUVASTATIN CALCIUM 20 MG PO TABS
20.0000 mg | ORAL_TABLET | Freq: Every day | ORAL | 3 refills | Status: AC
  Filled 2024-03-09: qty 90, 90d supply, fill #0

## 2024-03-10 ENCOUNTER — Encounter (INDEPENDENT_AMBULATORY_CARE_PROVIDER_SITE_OTHER): Payer: Self-pay | Admitting: Nurse Practitioner

## 2024-03-10 ENCOUNTER — Ambulatory Visit (INDEPENDENT_AMBULATORY_CARE_PROVIDER_SITE_OTHER): Admitting: Nurse Practitioner

## 2024-03-10 VITALS — BP 117/74 | HR 70 | Temp 98.2°F | Ht 63.0 in | Wt 211.0 lb

## 2024-03-10 DIAGNOSIS — I2510 Atherosclerotic heart disease of native coronary artery without angina pectoris: Secondary | ICD-10-CM | POA: Diagnosis not present

## 2024-03-10 DIAGNOSIS — I10 Essential (primary) hypertension: Secondary | ICD-10-CM | POA: Diagnosis not present

## 2024-03-10 DIAGNOSIS — R0602 Shortness of breath: Secondary | ICD-10-CM | POA: Diagnosis not present

## 2024-03-10 DIAGNOSIS — E559 Vitamin D deficiency, unspecified: Secondary | ICD-10-CM

## 2024-03-10 DIAGNOSIS — Z1331 Encounter for screening for depression: Secondary | ICD-10-CM | POA: Diagnosis not present

## 2024-03-10 DIAGNOSIS — K219 Gastro-esophageal reflux disease without esophagitis: Secondary | ICD-10-CM | POA: Diagnosis not present

## 2024-03-10 DIAGNOSIS — E66812 Obesity, class 2: Secondary | ICD-10-CM

## 2024-03-10 DIAGNOSIS — Z6837 Body mass index (BMI) 37.0-37.9, adult: Secondary | ICD-10-CM

## 2024-03-10 DIAGNOSIS — E7800 Pure hypercholesterolemia, unspecified: Secondary | ICD-10-CM

## 2024-03-10 DIAGNOSIS — R5383 Other fatigue: Secondary | ICD-10-CM | POA: Diagnosis not present

## 2024-03-10 DIAGNOSIS — G4733 Obstructive sleep apnea (adult) (pediatric): Secondary | ICD-10-CM

## 2024-03-10 NOTE — Progress Notes (Addendum)
 [address] [place],  [place], [place] [address]  Office: [phone]  /  Fax: [phone]   Subjective   Initial Visit  Joy Contreras (MR# [id number]) is a 48 y.o. female who presents for evaluation and treatment of obesity and related comorbidities. Current BMI is Body mass index is 37.38 kg/m. Joy Contreras has been struggling with her weight for many years and has been unsuccessful in either losing weight, maintaining weight loss, or reaching her healthy weight goal.  Joy Contreras is currently in the action stage of change and ready to dedicate time achieving and maintaining a healthier weight. Joy Contreras is interested in becoming our patient and working on intensive lifestyle modifications including (but not limited to) diet and exercise for weight loss.  Joy Contreras does have sleep apnea with CPAP- last used over 6 months ago.  2D echo was completed 12/2018 with EF of 45-50%, mild LVE/LVH. She is seen through cardiology for tachycardia and hypertension.  She is currently on Carvedilol  12.5 mg 2 tabs bid, Ivabradine  HCL 5 mg BID and amlodipine  5 mg every day. BP's are currently well controlled as well as pulse BP Readings from Last 3 Encounters:  03/10/24 117/74  03/06/24 124/70  02/10/24 134/82   Pulse Readings from Last 3 Encounters:  03/10/24 70  03/06/24 62  02/10/24 74    Joy Contreras also has GERD which is primarily controlled through diet and behavior modification. She does Take Protonix  40 mg PRN.  She does have Vitamin D  deficiency but is not currently on supplementation.    Joy Contreras first noticed weight gain after the birth of her children.  She noticed her weight increased again with menopause after a hysterectomy. She was on GLP1 in the past for 6-7 weeks and did notice weight loss but insurance coverage was stopped so could no longer use the medication.  She also used Contrave in the past but caused constipation  and was stopped. She does have plantar fasciitis which limits activity when it  flares.   Weight history:  When asked how their weight has affected their life and health, she states: Has affected self-esteem, Contributed to medical problems, Contributed to orthopedic problems or mobility issues, Having fatigue, and Having poor endurance  When asked what else they would like to accomplish? She states: Adopt a healthier eating pattern and lifestyle, Improve energy levels and physical activity, Improve existing medical conditions, Improve quality of life, Improve appearance, and Improve self-confidence  She starting to note weight gain during : pregnancy and menopause from hysterectomy  Life events associated with weight gain include : pregnancy and menopause from hysterectomy.   Other contributing factors: family history of obesity, disruption of circadian rhythm / sleep disordered breathing, consumption of processed foods, use of obesogenic medications: Beta-blockers, moderate to high levels of stress, reduced physical activity, chronic skipping of meals, menopause, multiple weight loss attempts in the past, sedentary job, and need for convenient foods.  Their highest weight has been:  215 lbs.  Desired weight: 160  Previous weight-loss programs : Weight Watchers.  Their maximum weight loss was:  15 lbs.  Their greatest challenge with dieting: difficulty maintaining reduced calorie state and meal preparation and cooking.  Current or previous pharmacotherapy: GLP-1 and Other: contrave.  Response to medication: Had side effects so it was discontinued and Was cost prohibitive or lost coverage for AOM   Nutritional History:  Current nutrition plan: None.  How many times do you eat outside the home: 2-4 per week  How often do they skip  meals: skips breakfast and skips lunch alternates between the two several times a week  What beverages do they drink: water, caffeinated beverages , regular soda , sweet tea , and alcohol(2-3 servings 1-2 times a week).   Use of  artificial sweetners : Yes  Food intolerances or dislikes: brown rice and whole grains.  Food triggers: Stress, Boredom, and To help comfort self.  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 Dx with Autonomously functioning goiter during pregnancy. anti-TPO negative.    Heart burn    High cholesterol    Hypertension    Insomnia    Joint pain    Menorrhagia    Being tx by GYN   Obesity    Prediabetes    Sleep apnea    wears CPAP   Tachycardia    Required BB during pregnancy. Baseline 100-130 at rest   Vitamin D  deficiency      Objective   BP 117/74   Pulse 70   Temp 98.2 F (36.8 C)   Ht 5' 3 (1.6 m)   Wt 211 lb (95.7 kg)   LMP 02/02/2013   SpO2 97%   BMI 37.38 kg/m  She was weighed on the bioimpedance scale: Body mass index is 37.38 kg/m.    Anthropometrics:  Vitals Temp: 98.2 F (36.8 C) BP: 117/74 Pulse Rate: 70 SpO2: 97 %   Anthropometric Measurements Height: 5' 3 (1.6 m) Weight: 211 lb (95.7 kg) BMI (Calculated): 37.39 Peak Weight: 215 lb Waist Measurement : 45 inches   Body Composition  Body Fat %: 46.3 % Fat Mass (lbs): 98 lbs Muscle Mass (lbs): 108 lbs Total Body Water (lbs): 75.6 lbs Visceral Fat Rating : 13   Other Clinical Data Fasting: yes Labs: yes Today's Visit #: 1 Starting Date: 03/10/24    Physical Exam:  General: She is overweight, cooperative, alert, well  developed, and in no acute distress. PSYCH: Has normal mood, affect and thought process.   HEENT: EOMI, sclerae are anicteric. Lungs: Normal breathing effort, no conversational dyspnea. Extremities: No edema.  Neurologic: No gross sensory or motor deficits. No tremors or fasciculations noted.    Diagnostic Data Reviewed  EKG: Reviewed personally her previous EKG from 03/06/24 Normal sinus rhythm, rate 62. No conduction abnormalities, abnormal Q waves or chamber enlargement.  Indirect Calorimeter completed today shows a VO2 of 221 and a REE of 1526.  Her calculated basal metabolic rate is 8391 thus her resting energy expenditure same as calculated.  Depression Screen  Joy Contreras's PHQ-9 score was: 13. She feels it is social stressors and is  doing ok without medication.      03/10/2024    7:40 AM  Depression screen PHQ 2/9  Decreased Interest 3  Down, Depressed, Hopeless 1  PHQ - 2 Score 4  Altered sleeping 1  Tired, decreased energy 2  Change in  appetite 2  Feeling bad or failure about yourself  1  Trouble concentrating 3  Moving slowly or fidgety/restless 0  Suicidal thoughts 0  PHQ-9 Score 13  Difficult doing work/chores Somewhat difficult    Screening for Sleep Related Breathing Disorders  Joy Contreras admits to daytime somnolence and admits to waking up still tired. Patient has a history of sleep apnea but does not use her CPAP . Joy Contreras generally gets 5 or 6 hours of sleep per night, and states that she has difficulty falling asleep and nightime awakenings. Snoring is present. Apneic episodes are present. Epworth Sleepiness Score is 12.   BMET    Component Value Date/Time   NA 140 04/05/2023 1218   K 4.4 04/05/2023 1218   CL 104 04/05/2023 1218   CO2 25 04/05/2023 1218   GLUCOSE 93 04/05/2023 1218   GLUCOSE 107 (H) 12/21/2016 0900   BUN 9 04/05/2023 1218   CREATININE 0.71 04/05/2023 1218   CREATININE 0.75 02/16/2014 1102   CALCIUM 9.3 04/05/2023 1218   GFRNONAA 112  10/08/2019 1608   GFRNONAA >89 02/16/2014 1102   GFRAA 129 10/08/2019 1608   GFRAA >89 02/16/2014 1102   Lab Results  Component Value Date   HGBA1C 5.8 (H) 01/28/2024   HGBA1C 5.6 07/05/2016   No results found for: INSULIN  CBC    Component Value Date/Time   WBC 5.2 04/05/2023 1218   WBC 6.3 12/21/2016 0900   RBC 4.70 04/05/2023 1218   RBC 4.81 12/21/2016 0900   HGB 13.5 04/05/2023 1218   HCT 41.7 04/05/2023 1218   PLT 228 04/05/2023 1218   MCV 89 04/05/2023 1218   MCH 28.7 04/05/2023 1218   MCH 29.3 12/21/2016 0900   MCHC 32.4 04/05/2023 1218   MCHC 33.3 12/21/2016 0900   RDW 12.4 04/05/2023 1218   Iron/TIBC/Ferritin/ %Sat    Component Value Date/Time   IRON 56 02/16/2014 1102   FERRITIN 121 10/08/2019 1608   Lipid Panel     Component Value Date/Time   CHOL 212 (H) 01/28/2024 0000   TRIG 114 01/28/2024 0000   HDL 65 01/28/2024 0000   CHOLHDL 3.3 01/28/2024 0000   CHOLHDL 2.6 10/16/2011 1148   VLDL 19 10/16/2011 1148   LDLCALC 127 (H) 01/28/2024 0000   Hepatic Function Panel     Component Value Date/Time   PROT 6.9 04/05/2023 1218   ALBUMIN 4.1 04/05/2023 1218   AST 25 04/05/2023 1218   ALT 25 04/05/2023 1218   ALKPHOS 119 04/05/2023 1218   BILITOT 0.3 04/05/2023 1218      Component Value Date/Time   TSH 0.765 01/28/2024 0000     Assessment and Plan   TREATMENT PLAN FOR OBESITY:  Recommended Dietary Goals  Arriel is currently in the action stage of change. As such, her goal is to implement medically supervised obesity management plan.  She has agreed to implement: the Category 1 plan - 1000 kcal per day  Behavioral Intervention  We discussed the following Behavioral Modification Strategies today: increasing lean protein intake to established goals, decreasing simple carbohydrates , increasing vegetables, increasing lower glycemic fruits, increasing fiber rich foods, avoiding skipping meals, increasing water intake, work on meal planning and  preparation, reading food labels , keeping healthy foods at home, identifying sources and decreasing liquid calories, decreasing eating out or consumption of processed foods, and making healthy choices when eating convenient foods, planning for success, and better snacking choices  Additional resources provided today: Handout on healthy eating and balanced plate,  Handout on complex carbohydrates and lean sources of protein, Category 1 packet, and Handout principles of weight management  Recommended Physical Activity Goals  Nioma has been advised to work up to 150 minutes of moderate intensity aerobic activity a week and strengthening exercises 2-3 times per week for cardiovascular health, weight loss maintenance and preservation of muscle mass.   She has agreed to :  Think about enjoyable ways to increase daily physical activity and overcoming barriers to exercise and Increase physical activity in their day and reduce sedentary time (increase NEAT).  Medical Interventions and Pharmacotherapy We will work on building a therapist, art and behavioral strategies. We will discuss the role of pharmacotherapy as an adjunct at subsequent visits.   ASSOCIATED CONDITIONS ADDRESSED TODAY  Other Fatigue Joy Contreras does feel that her weight is causing her energy to be lower than it should be. Fatigue may be related to obesity, depression or many other causes. Labs will be ordered, and in the meanwhile, Joy Contreras will focus on self care including making healthy food choices, increasing physical activity and focusing on stress reduction.  Shortness of Breath on exertion Joy Contreras notes increasing shortness of breath with physical activity and seems to be worsening over time with weight gain. She notes getting out of breath sooner with activity than she used to. This has not gotten worse recently. Joy Contreras denies shortness of breath at rest or orthopnea.  Depression screening Depression screen  was high at 25- discussed with patient and she states she feels well controlled without medication, feels it is more of a social situation and does not feel she needs further intervention currently.  She knows to discuss if symptoms get worse.   Primary hypertension Continue Carvedilol  12.5 mg 2 tabs bid, Ivabradine  HCL 5 mg BID and amlodipine  5 mg every day Start Category 1 meal plan  and DASH diet Monitor BP and if consistently >140/90 notify PCP If develops headaches, chest pain, shortness of breath or dizziness go to ER Loss of 10-15% body weight can help improve blood pressures   Mild CAD, coronary artery calcium score 0- CT 04/06/20       Continue to work on nutrition and weight loss to help lower cholesterol       Maintain good BP control  Gastroesophageal reflux disease without esophagitis Continue Protonix  40 mg every day, avoid food triggers and lying down after eating -     Magnesium   Vitamin D  deficiency If Vit D level remains low will supplement with Ergocalciferol  50000 units once a week for 12 weeks and then recheck level.   -     VITAMIN D  25 Hydroxy (Vit-D Deficiency, Fractures)  Pure hypercholesterolemia Focus on implementing category 1 meal plan, limit saturated fats Continue on Crestor 20 mg every day  OSA on CPAP       Use CPAP 100% of the time- strongly counseled on restarting CPAP and use nightly- verbalizes that she plan to restart       Decreasing body weight by 10-15% can improve AHI   Class 2 severe obesity with serious comorbidity and body mass index (BMI) of 37.0 to 37.9 in adult, unspecified obesity type See plan above -     CBC with Differential/Platelet -     Comprehensive metabolic panel with GFR -     Insulin , random -     Magnesium  -     Vitamin B12 -     VITAMIN D  25 Hydroxy (Vit-D Deficiency, Fractures)    Follow-up  She was informed of the importance of frequent follow-up visits to maximize her success with intensive lifestyle  modifications for her multiple health conditions. She was informed we would discuss her lab results at her next visit unless there is a critical issue that needs to be addressed sooner. Joy Contreras agreed to keep her next visit at the agreed upon time to discuss these results.  Attestation Statement  This is the patient's intake visit at [facility]. The patient's Health Questionnaire was reviewed at length. Included in the packet: current and past health history, medications, allergies, ROS, gynecologic history (women only), surgical history, family history, social history, weight history, weight loss surgery history (for those that have had weight loss surgery), nutritional evaluation, mood and food questionnaire, PHQ9, Epworth questionnaire, sleep habits questionnaire, patient life and health improvement goals questionnaire. These will all be scanned into the patient's chart under media.   During the visit, I independently reviewed the patient's EKG from 03/06/24, previous labs, bioimpedance scale results, and indirect calorimetry results. I used this information to medically tailor a meal plan for the patient that will help her to lose weight and will improve her obesity-related conditions. I performed a medically necessary appropriate examination and/or evaluation. I discussed the assessment and treatment plan with the patient. The patient was provided an opportunity to ask questions and all were answered. The patient agreed with the plan and demonstrated an understanding of the instructions. Labs were ordered at this visit and will be reviewed at the next visit unless critical results need to be addressed immediately. Clinical information was updated and documented in the EMR.   In addition, they received basic education on identification of processed foods and reduction of these, different sources of lean proteins and complex carbohydrates and how to eat balanced by incorporation of whole  foods.  Reviewed by clinician on day of visit: allergies, medications, problem list, medical history, surgical history, family history, social history, and previous encounter notes.  I personally spent a total of 40 minutes in the care of the patient today including preparing to see the patient, getting/reviewing separately obtained history, performing a medically appropriate exam/evaluation, counseling and educating, placing orders, documenting clinical information in the EHR, independently interpreting results, and coordinating care. This excludes time performing EKG, indirect calorimetry and depression screening.   Merion Grimaldo ANP-C

## 2024-03-11 ENCOUNTER — Ambulatory Visit (INDEPENDENT_AMBULATORY_CARE_PROVIDER_SITE_OTHER): Payer: Self-pay | Admitting: Nurse Practitioner

## 2024-03-11 ENCOUNTER — Other Ambulatory Visit: Payer: Self-pay

## 2024-03-11 LAB — CBC WITH DIFFERENTIAL/PLATELET
Basophils Absolute: 0.1 x10E3/uL (ref 0.0–0.2)
Basos: 1 %
EOS (ABSOLUTE): 0.2 x10E3/uL (ref 0.0–0.4)
Eos: 3 %
Hematocrit: 41.5 % (ref 34.0–46.6)
Hemoglobin: 13.6 g/dL (ref 11.1–15.9)
Immature Grans (Abs): 0 x10E3/uL (ref 0.0–0.1)
Immature Granulocytes: 0 %
Lymphocytes Absolute: 2.1 x10E3/uL (ref 0.7–3.1)
Lymphs: 46 %
MCH: 29.2 pg (ref 26.6–33.0)
MCHC: 32.8 g/dL (ref 31.5–35.7)
MCV: 89 fL (ref 79–97)
Monocytes Absolute: 0.3 x10E3/uL (ref 0.1–0.9)
Monocytes: 7 %
Neutrophils Absolute: 2 x10E3/uL (ref 1.4–7.0)
Neutrophils: 43 %
Platelets: 264 x10E3/uL (ref 150–450)
RBC: 4.66 x10E6/uL (ref 3.77–5.28)
RDW: 12.8 % (ref 11.7–15.4)
WBC: 4.7 x10E3/uL (ref 3.4–10.8)

## 2024-03-11 LAB — VITAMIN B12: Vitamin B-12: 731 pg/mL (ref 232–1245)

## 2024-03-11 LAB — VITAMIN D 25 HYDROXY (VIT D DEFICIENCY, FRACTURES): Vit D, 25-Hydroxy: 32 ng/mL (ref 30.0–100.0)

## 2024-03-11 LAB — COMPREHENSIVE METABOLIC PANEL WITH GFR
ALT: 21 IU/L (ref 0–32)
AST: 20 IU/L (ref 0–40)
Albumin: 4.4 g/dL (ref 3.9–4.9)
Alkaline Phosphatase: 123 IU/L — ABNORMAL HIGH (ref 41–116)
BUN/Creatinine Ratio: 10 (ref 9–23)
BUN: 7 mg/dL (ref 6–24)
Bilirubin Total: 0.3 mg/dL (ref 0.0–1.2)
CO2: 21 mmol/L (ref 20–29)
Calcium: 9.3 mg/dL (ref 8.7–10.2)
Chloride: 103 mmol/L (ref 96–106)
Creatinine, Ser: 0.7 mg/dL (ref 0.57–1.00)
Globulin, Total: 3.1 g/dL (ref 1.5–4.5)
Glucose: 96 mg/dL (ref 70–99)
Potassium: 4.3 mmol/L (ref 3.5–5.2)
Sodium: 140 mmol/L (ref 134–144)
Total Protein: 7.5 g/dL (ref 6.0–8.5)
eGFR: 107 mL/min/1.73 (ref >59)

## 2024-03-11 LAB — MAGNESIUM: Magnesium: 2 mg/dL (ref 1.6–2.3)

## 2024-03-11 LAB — INSULIN, RANDOM: INSULIN: 24 u[IU]/mL (ref 2.6–24.9)

## 2024-03-24 ENCOUNTER — Ambulatory Visit (INDEPENDENT_AMBULATORY_CARE_PROVIDER_SITE_OTHER): Payer: Self-pay | Admitting: Nurse Practitioner

## 2024-04-01 ENCOUNTER — Ambulatory Visit

## 2024-04-06 ENCOUNTER — Ambulatory Visit (INDEPENDENT_AMBULATORY_CARE_PROVIDER_SITE_OTHER): Admitting: Nurse Practitioner

## 2024-04-22 ENCOUNTER — Other Ambulatory Visit: Payer: Self-pay | Admitting: Cardiovascular Disease

## 2024-04-22 ENCOUNTER — Encounter (INDEPENDENT_AMBULATORY_CARE_PROVIDER_SITE_OTHER): Payer: Self-pay

## 2024-04-23 ENCOUNTER — Encounter (INDEPENDENT_AMBULATORY_CARE_PROVIDER_SITE_OTHER): Payer: Self-pay | Admitting: Nurse Practitioner

## 2024-04-23 ENCOUNTER — Ambulatory Visit (INDEPENDENT_AMBULATORY_CARE_PROVIDER_SITE_OTHER): Admitting: Nurse Practitioner

## 2024-04-23 ENCOUNTER — Other Ambulatory Visit: Payer: Self-pay

## 2024-04-23 VITALS — BP 122/78 | HR 82 | Temp 98.2°F | Ht 63.0 in | Wt 211.0 lb

## 2024-04-23 DIAGNOSIS — E66812 Obesity, class 2: Secondary | ICD-10-CM | POA: Diagnosis not present

## 2024-04-23 DIAGNOSIS — I2510 Atherosclerotic heart disease of native coronary artery without angina pectoris: Secondary | ICD-10-CM

## 2024-04-23 DIAGNOSIS — G4733 Obstructive sleep apnea (adult) (pediatric): Secondary | ICD-10-CM

## 2024-04-23 DIAGNOSIS — Z6837 Body mass index (BMI) 37.0-37.9, adult: Secondary | ICD-10-CM

## 2024-04-23 DIAGNOSIS — E7800 Pure hypercholesterolemia, unspecified: Secondary | ICD-10-CM | POA: Diagnosis not present

## 2024-04-23 DIAGNOSIS — E88819 Insulin resistance, unspecified: Secondary | ICD-10-CM | POA: Diagnosis not present

## 2024-04-23 DIAGNOSIS — I10 Essential (primary) hypertension: Secondary | ICD-10-CM

## 2024-04-23 DIAGNOSIS — E559 Vitamin D deficiency, unspecified: Secondary | ICD-10-CM | POA: Diagnosis not present

## 2024-04-23 MED ORDER — VITAMIN D (ERGOCALCIFEROL) 1.25 MG (50000 UNIT) PO CAPS
50000.0000 [IU] | ORAL_CAPSULE | ORAL | 1 refills | Status: DC
  Filled 2024-04-23: qty 5, 35d supply, fill #0

## 2024-04-23 MED ORDER — METFORMIN HCL ER 500 MG PO TB24
500.0000 mg | ORAL_TABLET | Freq: Every day | ORAL | 0 refills | Status: DC
  Filled 2024-04-23: qty 30, 30d supply, fill #0

## 2024-04-23 NOTE — Progress Notes (Signed)
 Office: [phone]  /  Fax: [phone]  WEIGHT SUMMARY AND BIOMETRICS  Weight Lost Since Last Visit: 0lb  Weight Gained Since Last Visit: 0lb   Vitals Temp: 98.2 F (36.8 C) BP: 122/78 Pulse Rate: 82 SpO2: 99 %   Anthropometric Measurements Height: 5' 3 (1.6 m) Weight: 211 lb (95.7 kg) BMI (Calculated): 37.39 Weight at Last Visit: 211lb Weight Lost Since Last Visit: 0lb Weight Gained Since Last Visit: 0lb Starting Weight: 211lb Total Weight Loss (lbs): 0 lb (0 kg) Peak Weight: 215lb Waist Measurement : 45 inches   Body Composition  Body Fat %: 45.5 % Fat Mass (lbs): 96.2 lbs Muscle Mass (lbs): 109.6 lbs Total Body Water (lbs): 76.2 lbs Visceral Fat Rating : 12   Other Clinical Data RMR: [id number] Fasting: No Labs: No Today's Visit #: 2 Starting Date: 03/10/24    Total Weight Loss: 0 Bio Impedance Data reviewed with patient: Muscle is up 1.6 pounds and adipose is down 1.8 pounds. Visceral fat rating decreased 1 point from 13 to 12.   HPI  Chief Complaint: OBESITY  Joy Contreras is here to discuss her progress with her obesity treatment plan. She is on the the Category 1 Plan and states she is following her eating plan approximately 0 % of the time. She states she is exercising 30 minutes 1 days per week.   Interval History:  Since last office visit she has been having difficulty being on plan, skipping breakfast. She rushes getting to work in the am. She will occasionally skip lunch. She will then be starving at dinner time.  Thanksgiving she did eat a little more than she had planned. No sweet sodas. Still has 2 glasses of wine a weekend   Fallon does have hypertension and his BP's are currently well controlled on Amlodipine  5 mg every other day and carvedilol  12.5 mg 2 tabs BID . Denies headaches, chest pain shortness of breath at rest and dizziness  BP Readings from Last 3 Encounters:  04/23/24 122/78  03/10/24 117/74  03/06/24 124/70   She is on  Rosuvastatin  20 mg every day for hyperlipidemia- denies side effects with medication. She has an elevated lipoprotein (a)- 77.6. She does have mild CAD with coronary artery calcium  score of 0. She follows with cardiology and continues on Corlanor  5 mg BID for tachycardia.  Lab Results  Component Value Date   CHOL 212 (H) 01/28/2024   HDL 65 01/28/2024   LDLCALC 127 (H) 01/28/2024   TRIG 114 01/28/2024   CHOLHDL 3.3 01/28/2024   She does have OSA , she is using her CPAP 5 nights a week.    Lab results are reviewed with patient in detail. Electrolytes, fasting glucose,kidney functions, Vit b12, CBC are all normal.  Fasting insulin  is elevated at 24.0 with HOMA-IR of 5.69 indicating insulin  resistance. Vit D is low normal and would benefit from supplementation. Alkaline Phosphatase is mildly elevated but ALT and AST are normal.  Cancer screenings are reviewed with patient as obesity is a risk cancer for certain cancers.   Pap: 2016, hysterectomy 2016 Mammogram: 03/31/2022 - negative, scheduled for 1/12025 Colonoscopy: 03/24/2021 repeat due 2027  ASCVD risk is reviewed with patient as obesity is a risk factor for cardiovascular disease  The 10-year ASCVD risk score (Arnett DK, et al., 2019) is: 1.6%- low risk   Values used to calculate the score:     Age: 69 years     Clinically relevant sex: Female     Is Non-Hispanic African  American: Yes     Diabetic: No     Tobacco smoker: No     Systolic Blood Pressure: 117 mmHg     Is BP treated: Yes     HDL Cholesterol: 65 mg/dL     Total Cholesterol: 212 mg/dL  PHYSICAL EXAM:  Blood pressure 122/78, pulse 82, temperature 98.2 F (36.8 C), height 5' 3 (1.6 m), weight 211 lb (95.7 kg), last menstrual period 02/02/2013, SpO2 99%. Body mass index is 37.38 kg/m.  General: Well Developed, well nourished, and in no acute distress.  HEENT: Normocephalic, atraumatic; EOMI, sclerae are anicteric. Skin: Warm and dry, good turgor Chest:  Normal  excursion, shape, no gross ABN Respiratory: No conversational dyspnea; speaking in full sentences NeuroM-Sk:  Normal gross ROM * 4 extremities  Psych: A and O X 3, insight adequate, mood- full    DIAGNOSTIC DATA REVIEWED:  BMET    Component Value Date/Time   NA 140 03/10/2024 0833   K 4.3 03/10/2024 0833   CL 103 03/10/2024 0833   CO2 21 03/10/2024 0833   GLUCOSE 96 03/10/2024 0833   GLUCOSE 107 (H) 12/21/2016 0900   BUN 7 03/10/2024 0833   CREATININE 0.70 03/10/2024 0833   CREATININE 0.75 02/16/2014 1102   CALCIUM  9.3 03/10/2024 0833   GFRNONAA 112 10/08/2019 1608   GFRNONAA >89 02/16/2014 1102   GFRAA 129 10/08/2019 1608   GFRAA >89 02/16/2014 1102   Lab Results  Component Value Date   HGBA1C 5.8 (H) 01/28/2024   HGBA1C 5.6 07/05/2016   Lab Results  Component Value Date   INSULIN  24.0 03/10/2024   Lab Results  Component Value Date   TSH 0.765 01/28/2024   CBC    Component Value Date/Time   WBC 4.7 03/10/2024 0833   WBC 6.3 12/21/2016 0900   RBC 4.66 03/10/2024 0833   RBC 4.81 12/21/2016 0900   HGB 13.6 03/10/2024 0833   HCT 41.5 03/10/2024 0833   PLT 264 03/10/2024 0833   MCV 89 03/10/2024 0833   MCH 29.2 03/10/2024 0833   MCH 29.3 12/21/2016 0900   MCHC 32.8 03/10/2024 0833   MCHC 33.3 12/21/2016 0900   RDW 12.8 03/10/2024 0833   Iron Studies    Component Value Date/Time   IRON 56 02/16/2014 1102   FERRITIN 121 10/08/2019 1608   Lipid Panel     Component Value Date/Time   CHOL 212 (H) 01/28/2024 0000   TRIG 114 01/28/2024 0000   HDL 65 01/28/2024 0000   CHOLHDL 3.3 01/28/2024 0000   CHOLHDL 2.6 10/16/2011 1148   VLDL 19 10/16/2011 1148   LDLCALC 127 (H) 01/28/2024 0000   Hepatic Function Panel     Component Value Date/Time   PROT 7.5 03/10/2024 0833   ALBUMIN 4.4 03/10/2024 0833   AST 20 03/10/2024 0833   ALT 21 03/10/2024 0833   ALKPHOS 123 (H) 03/10/2024 0833   BILITOT 0.3 03/10/2024 0833      Component Value Date/Time   TSH  0.765 01/28/2024 0000   Nutritional Lab Results  Component Value Date   VD25OH 32.0 03/10/2024   VD25OH 45.9 06/12/2022   VD25OH 30.4 03/10/2021     ASSESSMENT AND PLAN  Class 2 severe obesity with serious comorbidity and body mass index (BMI) of 37.0 to 37.9 in adult, unspecified obesity type  TREATMENT PLAN FOR OBESITY:  Recommended Dietary Goals  Shaquetta is currently in the action stage of change. As such, her goal is to continue weight management plan. She  has agreed to the Category 1 Plan.  Behavioral Intervention  We discussed the following Behavioral Modification Strategies today: increasing lean protein intake to established goals, decreasing simple carbohydrates , increasing vegetables, increasing fiber rich foods, avoiding skipping meals, work on meal planning and preparation, celebration eating strategies, continue to work on maintaining a reduced calorie state, getting the recommended amount of protein, incorporating whole foods, making healthy choices, staying well hydrated and practicing mindfulness when eating., and increase protein intake, fibrous foods (25 grams per day for women, 30 grams for men) and water to improve satiety and decrease hunger signals. .  She plans to maintain her weight in December - She wants to have small indulgences, but she is not going to waste her calories on things that are not wonderful. - Outside of social situations she will continue to follow a structured eating plan but enjoy herself with portion control for holiday events, parties and get-togethers - She does recognize that this strategy is to help her maintain her weight, not lose weight and in January she will return to a structured plan for weight loss   Recommended Physical Activity Goals  Sherea has been advised to work up to 150 minutes of moderate intensity aerobic activity a week and strengthening exercises 2-3 times per week for cardiovascular health, weight loss  maintenance and preservation of muscle mass.   She has agreed to Increase physical activity in their day and reduce sedentary time (increase NEAT). and Increase aerobic activity with a goal of 150 minutes a week at moderate intensity.    Pharmacotherapy We discussed various medication options to help Sole with her weight loss efforts and we both agreed to start Metformin XR 500 mg every day for insulin  resistance, counseled on side effects. Start ergocalciferol  50000 units once a week for Vit D deficiency.  ASSOCIATED CONDITIONS ADDRESSED TODAY  Action/Plan  Primary hypertension Continue Amlodipine  5 mg every other day and carvedilol  12.5 mg 2 tabs BID - she is having some swelling on Amlodipine  and plans to discuss with her PCP at upcoming appointment Continue Category 1 meal plan  and DASH diet Monitor BP and if consistently >140/90 notify PCP If develops headaches, chest pain, shortness of breath or dizziness go to ER Continue to follow regularly with PCP and cardiology  Mild CAD, coronary artery calcium  score 0- CT 04/06/20       Continue to follow with cardiology       Control BP, A1c, cholesterol and weight       Continue Category 1 meal plan  Vitamin D  deficiency Supplement with Ergocalciferol  50000 units once a week for 12 weeks and then recheck level.   -     Vitamin D  (Ergocalciferol ); Take 1 capsule (50,000 Units total) by mouth every 7 (seven) days.  Dispense: 5 capsule; Refill: 1  Pure hypercholesterolemia Focus on implementing category 1 meal plan, limit saturated fats Continue Rosuvastatin  20 mg every day Continue to follow regularly with cardiology Focus on getting 150 minutes a week of moderate to high intensity exercise   OSA on CPAP       Use CPAP 100% of the time       Decreasing body weight by 10-15% can improve AHI   Insulin  resistance Continue to implement Category 1  meal plan, limit simple carbohydrates Start Metformin XR 500 mg every day, counseled  on side effects Start to increase exercise with current goal of 150 minutes of moderate to high intensity exercise/week.  -  metFORMIN HCl ER; Take 1 tablet (500 mg total) by mouth daily with breakfast.  Dispense: 30 tablet; Refill: 0         Return in about 4 weeks (around 05/21/2024).SABRA She was informed of the importance of frequent follow up visits to maximize her success with intensive lifestyle modifications for her multiple health conditions.   ATTESTASTION STATEMENTS:  Reviewed by clinician on day of visit: allergies, medications, problem list, medical history, surgical history, family history, social history, and previous encounter notes.   I personally spent a total of 38 minutes in the care of the patient today including preparing to see the patient, getting/reviewing separately obtained history, performing a medically appropriate exam/evaluation, counseling and educating, placing orders, and documenting clinical information in the EHR.   Gennett Garcia ANP-C

## 2024-04-24 ENCOUNTER — Other Ambulatory Visit: Payer: Self-pay

## 2024-04-24 MED ORDER — IVABRADINE HCL 5 MG PO TABS
5.0000 mg | ORAL_TABLET | Freq: Two times a day (BID) | ORAL | 3 refills | Status: AC
  Filled 2024-04-24: qty 180, 90d supply, fill #0

## 2024-04-27 ENCOUNTER — Other Ambulatory Visit: Payer: Self-pay

## 2024-04-28 ENCOUNTER — Ambulatory Visit: Payer: Self-pay

## 2024-05-05 ENCOUNTER — Ambulatory Visit: Admitting: Student

## 2024-05-05 ENCOUNTER — Other Ambulatory Visit: Payer: Self-pay

## 2024-05-05 VITALS — BP 133/85 | HR 82 | Temp 98.5°F | Ht 63.0 in | Wt 211.0 lb

## 2024-05-05 DIAGNOSIS — Z6837 Body mass index (BMI) 37.0-37.9, adult: Secondary | ICD-10-CM | POA: Diagnosis not present

## 2024-05-05 DIAGNOSIS — E66812 Obesity, class 2: Secondary | ICD-10-CM

## 2024-05-05 DIAGNOSIS — E785 Hyperlipidemia, unspecified: Secondary | ICD-10-CM

## 2024-05-05 DIAGNOSIS — E669 Obesity, unspecified: Secondary | ICD-10-CM

## 2024-05-05 DIAGNOSIS — I2510 Atherosclerotic heart disease of native coronary artery without angina pectoris: Secondary | ICD-10-CM

## 2024-05-05 DIAGNOSIS — R413 Other amnesia: Secondary | ICD-10-CM

## 2024-05-05 DIAGNOSIS — Z1331 Encounter for screening for depression: Secondary | ICD-10-CM | POA: Diagnosis not present

## 2024-05-05 DIAGNOSIS — I10 Essential (primary) hypertension: Secondary | ICD-10-CM | POA: Diagnosis not present

## 2024-05-05 MED ORDER — IRBESARTAN 75 MG PO TABS
75.0000 mg | ORAL_TABLET | Freq: Every day | ORAL | 11 refills | Status: DC
  Filled 2024-05-05: qty 30, 30d supply, fill #0

## 2024-05-05 NOTE — Patient Instructions (Addendum)
 Thank you, [name] for allowing us  to provide your care today. Today we discussed   Hypertension: Side effect of your amlodipine . Lets stop it and try Irbesartan , which is covered by your insurance. Before I start you on it, id like to check your renal function, potassium in blood, and urine protein.  Keep a blood pressure log and be vigilant about dizziness, lightheadedness - check your BP then to see if it is too low.  - You will need another BMP in about one month after starting this medication to track it.  Weight - continue to work with weight management clinic.  Cholesterol - we will recheck your lipid panel today to make sure your LDL has decreased. If persistently elevated, you will need sooner follow up with Cardiology per their notes given your Lpa is elevated and you have history of cholesterol in the arteries of your heart    I have ordered the following labs for you:   Lab Orders         Basic metabolic panel with GFR         Microalbumin / Creatinine Urine Ratio         Lipid Profile      I will call if any are abnormal. All of your labs can be accessed through My Chart.   My Chart Access: [email or web address]  Please follow-up in: one  month    We look forward to seeing you next time. Please call our clinic at [phone] if you have any questions or concerns. The best time to call is Monday-Friday from 9am-4pm, but there is someone available 24/7. If after hours or the weekend, call the main hospital number and ask for the Internal Medicine Resident On-Call. If you need medication refills, please notify your pharmacy one week in advance and they will send us  a request.   Thank you for letting us  take part in your care. Wishing you the best!  Joy Ip, MD 05/05/2024, 9:53 AM [facility]

## 2024-05-05 NOTE — Progress Notes (Signed)
 "   Subjective:  CC: Follow-up  HPI:  Joy Contreras is a 48 y.o. female with a past medical history stated below and presents today for hypertension follow-up. Please see problem based assessment and plan for additional details.  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 Dx with Autonomously functioning goiter during pregnancy. anti-TPO negative.    Heart burn    High cholesterol    Hypertension    Insomnia    Joint pain    Menorrhagia    Being tx by GYN   Obesity    Prediabetes    Sleep apnea    wears CPAP   Tachycardia    Required BB during pregnancy. Baseline 100-130 at rest   Vitamin D  deficiency     Medications Ordered Prior to Encounter[1]  Family History  Problem Relation Age of Onset   Alcohol abuse Mother    Anxiety disorder Mother    Depression Mother    Heart disease Mother    Diabetes Mother    Hypertension Mother    Bipolar disorder Mother    High Cholesterol Mother    Liver disease Mother    Alcohol abuse Father    Seizures Father    Sudden death Father    Stickler syndrome Sister    Mental illness Brother    Schizophrenia Brother    ADD / ADHD Son    Colon cancer Neg Hx    Esophageal cancer Neg Hx    Rectal cancer Neg Hx    Stomach cancer Neg Hx    Sleep apnea Neg Hx     Social History   Socioeconomic History   Marital status: Married    Spouse name: Yancy   Number of children: 2   Years of education: Not on file   Highest education level: Not on file  Occupational History   Occupation: CMA    Employer: [employer]  Tobacco Use   Smoking status: Never   Smokeless tobacco: Never  Vaping Use   Vaping status: Never Used  Substance and Sexual Activity   Alcohol  use: Yes    Alcohol/week: 7.0 standard drinks of alcohol    Types: 7 Glasses of wine per week    Comment: occassionally    Drug use: No   Sexual activity: Not on file  Other Topics Concern   Not on file  Social History Narrative   Not on file   Social Drivers of Health   Tobacco Use: Low Risk (04/23/2024)   Patient History    Smoking Tobacco Use: Never    Smokeless Tobacco Use: Never    Passive Exposure: Not on file  Financial Resource Strain: Low Risk (05/05/2024)   Overall Financial Resource Strain (CARDIA)    Difficulty of Paying Living Expenses: Not hard at all  Food Insecurity: No Food Insecurity (05/05/2024)   Epic    Worried About Radiation Protection Practitioner of Food in the Last Year: Never true    Ran Out of Food in the Last Year: Never true  Transportation Needs: No Transportation Needs (05/05/2024)   Epic    Lack of Transportation (Medical): No    Lack  of Transportation (Non-Medical): No  Physical Activity: Inactive (05/05/2024)   Exercise Vital Sign    Days of Exercise per Week: 0 days    Minutes of Exercise per Session: 0 min  Stress: No Stress Concern Present (05/05/2024)   [employer] of Occupational Health - Occupational Stress Questionnaire    Feeling of Stress: Not at all  Social Connections: Socially Integrated (05/05/2024)   Social Connection and Isolation Panel    Frequency of Communication with Friends and Family: More than three times a week    Frequency of Social Gatherings with Friends and Family: Once a week    Attends Religious Services: More than 4 times per year    Active Member of Clubs or Organizations: Yes    Attends Banker Meetings: 1 to 4 times per year    Marital Status: Married  Catering Manager Violence: Not At Risk (05/05/2024)   Epic    Fear of Current or Ex-Partner: No    Emotionally Abused: No    Physically Abused: No    Sexually Abused: No  Depression (PHQ2-9): Low Risk (05/05/2024)   Depression (PHQ2-9)    PHQ-2 Score: 0   Recent Concern: Depression (PHQ2-9) - High Risk (03/10/2024)   Depression (PHQ2-9)    PHQ-2 Score: 13  Alcohol Screen: Low Risk (05/05/2024)   Alcohol Screen    Last Alcohol Screening Score (AUDIT): 0  Housing: Low Risk (05/05/2024)   Epic    Unable to Pay for Housing in the Last Year: No    Number of Times Moved in the Last Year: 0    Homeless in the Last Year: No  Utilities: Not At Risk (05/05/2024)   Epic    Threatened with loss of utilities: No  Health Literacy: Adequate Health Literacy (05/05/2024)   B1300 Health Literacy    Frequency of need for help with medical instructions: Never    Review of Systems: ROS negative except for what is noted on the assessment and plan.  Objective:   Vitals:   05/05/24 0925 05/05/24 0930  BP: (!) 143/85 133/85  Pulse: 88 82  Temp: 98.5 F (36.9 C)   TempSrc: Oral   SpO2: 96%   Weight: 211 lb (95.7 kg)   Height: 5' 3 (1.6 m)     Physical Exam: Constitutional: well-appearing woman sitting in chair, in no acute distress HENT: normocephalic atraumatic, mucous membranes moist Eyes: conjunctiva non-erythematous Neck: supple, no JVP Cardiovascular: regular rate and rhythm, no m/r/g Pulmonary/Chest: normal work of breathing on room air, lungs clear to auscultation bilaterally Abdominal: soft, non-tender, non-distended MSK: 1+ pitting edema bilaterally, symmetrical Neurological: alert & oriented x 3, normal gait Skin: warm and dry Psych: Pleasant mood and affect     05/05/2024    9:26 AM  Depression screen PHQ 2/9  Decreased Interest 0  Down, Depressed, Hopeless 0  PHQ - 2 Score 0  Altered sleeping 0  Tired, decreased energy 0  Change in appetite 0  Feeling bad or failure about yourself  0  Trouble concentrating 0  Moving slowly or fidgety/restless 0  Suicidal thoughts 0  PHQ-9 Score 0  Difficult doing work/chores Not difficult at all       05/05/2024    9:27 AM  GAD 7 : Generalized Anxiety Score  Nervous, Anxious,  on Edge 0  Control/stop worrying 0  Worry too much - different things 0  Trouble relaxing 0  Restless 0  Easily annoyed or irritable 0  Afraid - awful might  happen 0  Total GAD 7 Score 0  Anxiety Difficulty Not difficult at all     Assessment & Plan:   Hypertension currently controlled; however, amlodipine  5 mg daily is causing pitting edema of the lower extremities that resolves with cessation of the medication for 3 days or so.  If feels life-limiting.  We discussed switching medications despite how well the amlodipine  has been working for her.   Our plan is to switch over to irbesartan  75 mg daily as this is what her insurance covers at this time.  She will monitor her blood pressures daily while on it and monitor for hypotension and related symptoms.  I also ordered a BMP and a urine ACR.  Patient will start the medication once these 2 tests are done on Monday 12/29.  I will not be in clinic, but she knows to follow-up with the clinic doctors if there are any abnormalities on her test.  Irbesartan  75 mg daily Stop amlodipine  Follow-up with lab work on 12/29  Memory loss Still awaiting testing but mentions that this has improved significantly.  We will continue to monitor  Depression screening PHQ-9 and GAD AD 7 scores of 0  Obesity Managed at the weight and wellness clinic.  Recently started on metformin  but having some GI distress and so she has stopped taking the medication.  She has follow-up at that clinic on 1/13.  Continues to have difficulties adhering with lifestyle modifications, especially around the holidays.  Will continue to support and encourage here in clinic  Dyslipidemia Lipid Panel     Component Value Date/Time   CHOL 212 (H) 01/28/2024 0000   TRIG 114 01/28/2024 0000   HDL 65 01/28/2024 0000   CHOLHDL 3.3 01/28/2024 0000   CHOLHDL 2.6 10/16/2011 1148   VLDL 19 10/16/2011 1148   LDLCALC 127 (H) 01/28/2024 0000   LABVLDL 20 01/28/2024 0000    Seen by  Cardiology and started on Crestor , to which patient is adherent. At the time, patient also had Lpa testing done with a result 77.6, mildly elevated. She was to have a follow up lipid panel about a month ago, we will re-test here as well to guide management.    Return in about 4 weeks (around 06/02/2024) for BMP and HTN follow up.  Patient discussed with Dr. Trudy Hadassah Kristy Rosario, MD [facility]  05/06/2024, 8:09 AM         [1]  Current Outpatient Medications on File Prior to Visit  Medication Sig Dispense Refill   carvedilol  (COREG ) 12.5 MG tablet Take 2 tablets (25 mg total) by mouth 2 (two) times daily. 360 tablet 3   estradiol  (CLIMARA  - DOSED IN MG/24 HR) 0.1 mg/24hr patch Place 1 patch (0.1 mg total) onto the skin once a week. 12 patch 3   ivabradine  (CORLANOR ) 5 MG TABS tablet Take 1 tablet (5 mg total) by mouth 2 (two) times daily. 180 tablet 3   metFORMIN  (GLUCOPHAGE -XR) 500 MG 24 hr tablet Take 1 tablet (500 mg total) by mouth daily with breakfast. 30 tablet 0   nitroGLYCERIN  (NITROSTAT ) 0.4 MG SL tablet PLACE 1 TABLET (0.4 MG TOTAL) UNDER THE TONGUE EVERY 5 (FIVE) MINUTES AS NEEDED FOR CHEST PAIN. 25 tablet 3   pantoprazole  (PROTONIX ) 40 MG tablet Take 1 tablet (40 mg total) by mouth 2 (two) times daily for 10 days. 20 tablet 0   rosuvastatin  (CRESTOR ) 20 MG tablet Take 1 tablet (20 mg total) by  mouth daily. 90 tablet 3   Vitamin D , Ergocalciferol , (DRISDOL ) 1.25 MG (50000 UNIT) CAPS capsule Take 1 capsule (50,000 Units total) by mouth every 7 (seven) days. 5 capsule 1   No current facility-administered medications on file prior to visit.   "

## 2024-05-06 ENCOUNTER — Other Ambulatory Visit: Payer: Self-pay

## 2024-05-06 ENCOUNTER — Encounter: Payer: Self-pay | Admitting: Student

## 2024-05-06 DIAGNOSIS — E785 Hyperlipidemia, unspecified: Secondary | ICD-10-CM | POA: Insufficient documentation

## 2024-05-06 MED ORDER — IRBESARTAN 75 MG PO TABS: 75.0000 mg | ORAL_TABLET | Freq: Every day | ORAL | 11 refills | Status: AC

## 2024-05-06 NOTE — Assessment & Plan Note (Signed)
 Lipid Panel     Component Value Date/Time   CHOL 212 (H) 01/28/2024 0000   TRIG 114 01/28/2024 0000   HDL 65 01/28/2024 0000   CHOLHDL 3.3 01/28/2024 0000   CHOLHDL 2.6 10/16/2011 1148   VLDL 19 10/16/2011 1148   LDLCALC 127 (H) 01/28/2024 0000   LABVLDL 20 01/28/2024 0000    Seen by Cardiology and started on Crestor , to which patient is adherent. At the time, patient also had Lpa testing done with a result 77.6, mildly elevated. She was to have a follow up lipid panel about a month ago, we will re-test here as well to guide management.

## 2024-05-06 NOTE — Assessment & Plan Note (Signed)
 PHQ-9 and GAD AD 7 scores of 0

## 2024-05-06 NOTE — Assessment & Plan Note (Signed)
 currently controlled; however, amlodipine  5 mg daily is causing pitting edema of the lower extremities that resolves with cessation of the medication for 3 days or so.  If feels life-limiting.  We discussed switching medications despite how well the amlodipine  has been working for her.   Our plan is to switch over to irbesartan  75 mg daily as this is what her insurance covers at this time.  She will monitor her blood pressures daily while on it and monitor for hypotension and related symptoms.  I also ordered a BMP and a urine ACR.  Patient will start the medication once these 2 tests are done on Monday 12/29.  I will not be in clinic, but she knows to follow-up with the clinic doctors if there are any abnormalities on her test.  Irbesartan  75 mg daily Stop amlodipine  Follow-up with lab work on 12/29

## 2024-05-06 NOTE — Assessment & Plan Note (Signed)
 Managed at the weight and wellness clinic.  Recently started on metformin  but having some GI distress and so she has stopped taking the medication.  She has follow-up at that clinic on 1/13.  Continues to have difficulties adhering with lifestyle modifications, especially around the holidays.  Will continue to support and encourage here in clinic

## 2024-05-06 NOTE — Assessment & Plan Note (Signed)
 Still awaiting testing but mentions that this has improved significantly.  We will continue to monitor

## 2024-05-12 ENCOUNTER — Other Ambulatory Visit (HOSPITAL_COMMUNITY): Payer: Self-pay

## 2024-05-12 ENCOUNTER — Other Ambulatory Visit: Payer: Self-pay

## 2024-05-12 ENCOUNTER — Other Ambulatory Visit

## 2024-05-12 DIAGNOSIS — I2510 Atherosclerotic heart disease of native coronary artery without angina pectoris: Secondary | ICD-10-CM

## 2024-05-12 DIAGNOSIS — E785 Hyperlipidemia, unspecified: Secondary | ICD-10-CM

## 2024-05-12 DIAGNOSIS — I10 Essential (primary) hypertension: Secondary | ICD-10-CM

## 2024-05-13 LAB — MICROALBUMIN / CREATININE URINE RATIO
Creatinine, Urine: 60 mg/dL
Microalb/Creat Ratio: 9 mg/g{creat} (ref 0–29)
Microalbumin, Urine: 5.1 ug/mL

## 2024-05-13 LAB — BASIC METABOLIC PANEL WITH GFR
BUN/Creatinine Ratio: 15 (ref 9–23)
BUN: 10 mg/dL (ref 6–24)
CO2: 23 mmol/L (ref 20–29)
Calcium: 9.3 mg/dL (ref 8.7–10.2)
Chloride: 106 mmol/L (ref 96–106)
Creatinine, Ser: 0.67 mg/dL (ref 0.57–1.00)
Glucose: 81 mg/dL (ref 70–99)
Potassium: 4.2 mmol/L (ref 3.5–5.2)
Sodium: 142 mmol/L (ref 134–144)
eGFR: 107 mL/min/1.73

## 2024-05-13 LAB — LIPID PANEL
Chol/HDL Ratio: 2.5 ratio (ref 0.0–4.4)
Cholesterol, Total: 165 mg/dL (ref 100–199)
HDL: 67 mg/dL
LDL Chol Calc (NIH): 81 mg/dL (ref 0–99)
Triglycerides: 90 mg/dL (ref 0–149)
VLDL Cholesterol Cal: 17 mg/dL (ref 5–40)

## 2024-05-18 NOTE — Progress Notes (Signed)
 Internal Medicine Clinic Attending  Case discussed with the resident at the time of the visit.  We reviewed the resident's history and exam and pertinent patient test results.  I agree with the assessment, diagnosis, and plan of care documented in the resident's note.

## 2024-05-26 ENCOUNTER — Other Ambulatory Visit: Payer: Self-pay

## 2024-05-26 ENCOUNTER — Ambulatory Visit (INDEPENDENT_AMBULATORY_CARE_PROVIDER_SITE_OTHER): Admitting: Family Medicine

## 2024-05-26 ENCOUNTER — Ambulatory Visit: Payer: Self-pay

## 2024-05-26 ENCOUNTER — Encounter: Payer: Self-pay | Admitting: Family Medicine

## 2024-05-26 ENCOUNTER — Encounter (INDEPENDENT_AMBULATORY_CARE_PROVIDER_SITE_OTHER): Payer: Self-pay | Admitting: Family Medicine

## 2024-05-26 ENCOUNTER — Ambulatory Visit: Admitting: Family Medicine

## 2024-05-26 VITALS — BP 142/78 | Ht 63.5 in | Wt 215.0 lb

## 2024-05-26 VITALS — BP 141/86 | HR 70 | Temp 98.2°F | Ht 63.0 in | Wt 215.0 lb

## 2024-05-26 DIAGNOSIS — M79672 Pain in left foot: Secondary | ICD-10-CM | POA: Diagnosis not present

## 2024-05-26 DIAGNOSIS — E669 Obesity, unspecified: Secondary | ICD-10-CM | POA: Diagnosis not present

## 2024-05-26 DIAGNOSIS — E88819 Insulin resistance, unspecified: Secondary | ICD-10-CM | POA: Diagnosis not present

## 2024-05-26 DIAGNOSIS — I10 Essential (primary) hypertension: Secondary | ICD-10-CM | POA: Diagnosis not present

## 2024-05-26 DIAGNOSIS — E559 Vitamin D deficiency, unspecified: Secondary | ICD-10-CM | POA: Diagnosis not present

## 2024-05-26 DIAGNOSIS — Z6838 Body mass index (BMI) 38.0-38.9, adult: Secondary | ICD-10-CM | POA: Diagnosis not present

## 2024-05-26 MED ORDER — VITAMIN D (ERGOCALCIFEROL) 1.25 MG (50000 UNIT) PO CAPS
50000.0000 [IU] | ORAL_CAPSULE | ORAL | 1 refills | Status: AC
  Filled 2024-05-26: qty 5, 35d supply, fill #0

## 2024-05-26 MED ORDER — PREDNISONE 10 MG PO TABS
ORAL_TABLET | ORAL | 0 refills | Status: DC
  Filled 2024-05-26: qty 21, 6d supply, fill #0

## 2024-05-26 MED ORDER — METFORMIN HCL ER 500 MG PO TB24
500.0000 mg | ORAL_TABLET | Freq: Every day | ORAL | 0 refills | Status: AC
  Filled 2024-05-26: qty 30, 30d supply, fill #0

## 2024-05-26 NOTE — Progress Notes (Signed)
 "  Office: [phone]  /  Fax: [phone]  WEIGHT SUMMARY AND BIOMETRICS  Anthropometric Measurements Height: 5' 3 (1.6 m) Weight: 215 lb (97.5 kg) BMI (Calculated): 38.09 Weight at Last Visit: 211 lb Weight Lost Since Last Visit: 0 Weight Gained Since Last Visit: 4 lb Starting Weight: 211 lb Total Weight Loss (lbs): 0 lb (0 kg) Peak Weight: 215 lb Waist Measurement : 45 inches   Body Composition  Body Fat %: 46.5 % Fat Mass (lbs): 100.2 lbs Muscle Mass (lbs): 109.6 lbs Total Body Water (lbs): 77.8 lbs Visceral Fat Rating : 13   Other Clinical Data RMR: [id number] Fasting: no Labs: no Today's Visit #: 3 Starting Date: 03/10/24    Chief Complaint: OBESITY    History of Present Illness Joy Contreras is a 49 year old female with obesity, vitamin D  deficiency, and prediabetes who presents for obesity treatment.  She is struggling with adherence to the category one eating plan, following it only about 20% of the time over the holidays. She finds it challenging to consume enough fruits, vegetables, and protein, and has gained four pounds in the last month, attributing this to holiday eating habits. She is attempting to incorporate walking into her routine but is experiencing left foot pain, which makes exercise difficult.  She is currently being treated for vitamin D  deficiency with prescription ergocalciferol , 50,000 IU weekly, and requests a refill. For her prediabetes, she is taking metformin  XR 500 mg daily and is also focusing on diet, exercise, and weight loss to manage her glucose levels. She experiences mild gastrointestinal side effects from metformin  but notes it has made a difference.  She is on carvedilol  12.5 mg and irbesartan  75 mg, and is also working on diet, exercise, and weight loss to improve her blood pressure.  Regarding her left foot pain, she has a history of plantar fasciitis, but this episode is different. She reports that an ultrasound  was performed in the office and she was told there was fluid around the ankle; she is unsure what that means. She has been prescribed prednisone  and reports she will start it tomorrow.  She lives with her husband and two adult children who have a 'big sweet tooth', which presents a challenge in maintaining her dietary plan. She works in an environment where there are often tempting foods available.      PHYSICAL EXAM:  Blood pressure (!) 141/86, pulse 70, temperature 98.2 F (36.8 C), height 5' 3 (1.6 m), weight 215 lb (97.5 kg), last menstrual period 02/02/2013, SpO2 96%. Body mass index is 38.09 kg/m.  DIAGNOSTIC DATA REVIEWED BY MYSELF TODAY:  BMET    Component Value Date/Time   NA 142 05/12/2024 1205   K 4.2 05/12/2024 1205   CL 106 05/12/2024 1205   CO2 23 05/12/2024 1205   GLUCOSE 81 05/12/2024 1205   GLUCOSE 107 (H) 12/21/2016 0900   BUN 10 05/12/2024 1205   CREATININE 0.67 05/12/2024 1205   CREATININE 0.75 02/16/2014 1102   CALCIUM  9.3 05/12/2024 1205   GFRNONAA 112 10/08/2019 1608   GFRNONAA >89 02/16/2014 1102   GFRAA 129 10/08/2019 1608   GFRAA >89 02/16/2014 1102   Lab Results  Component Value Date   HGBA1C 5.8 (H) 01/28/2024   HGBA1C 5.6 07/05/2016   Lab Results  Component Value Date   INSULIN  24.0 03/10/2024   Lab Results  Component Value Date   TSH 0.765 01/28/2024   CBC    Component Value Date/Time  WBC 4.7 03/10/2024 0833   WBC 6.3 12/21/2016 0900   RBC 4.66 03/10/2024 0833   RBC 4.81 12/21/2016 0900   HGB 13.6 03/10/2024 0833   HCT 41.5 03/10/2024 0833   PLT 264 03/10/2024 0833   MCV 89 03/10/2024 0833   MCH 29.2 03/10/2024 0833   MCH 29.3 12/21/2016 0900   MCHC 32.8 03/10/2024 0833   MCHC 33.3 12/21/2016 0900   RDW 12.8 03/10/2024 0833   Iron Studies    Component Value Date/Time   IRON 56 02/16/2014 1102   FERRITIN 121 10/08/2019 1608   Lipid Panel     Component Value Date/Time   CHOL 165 05/12/2024 1205   TRIG 90  05/12/2024 1205   HDL 67 05/12/2024 1205   CHOLHDL 2.5 05/12/2024 1205   CHOLHDL 2.6 10/16/2011 1148   VLDL 19 10/16/2011 1148   LDLCALC 81 05/12/2024 1205   Hepatic Function Panel     Component Value Date/Time   PROT 7.5 03/10/2024 0833   ALBUMIN 4.4 03/10/2024 0833   AST 20 03/10/2024 0833   ALT 21 03/10/2024 0833   ALKPHOS 123 (H) 03/10/2024 0833   BILITOT 0.3 03/10/2024 0833      Component Value Date/Time   TSH 0.765 01/28/2024 0000   Nutritional Lab Results  Component Value Date   VD25OH 32.0 03/10/2024   VD25OH 45.9 06/12/2022   VD25OH 30.4 03/10/2021     Assessment and Plan Assessment & Plan Obesity Management is challenging due to difficulty adhering to dietary recommendations and environmental temptations. Recent weight gain of four pounds over the holidays. Left foot pain limits exercise, with ultrasound showing fluid around the ankle and tendon thickening, likely due to micro tears and scar tissue. - Provided meal prep packet with seven different meals, six meeting nutritional requirements. - Encouraged meal prepping to facilitate adherence to dietary plan. - Advised on grocery delivery or store pickup to reduce shopping stress. - Discussed potential for increased hunger and sleep disturbances with prednisone  use. - Plan to increase physical activity once foot pain improves.  IR Managed with metformin  XR 500 mg daily. Mild gastrointestinal side effects reported but not significant enough to discontinue medication. Emphasis on diet, exercise, and weight loss to manage glucose levels. - Refilled metformin  XR 500 mg daily. - Continue diet, exercise, and weight loss efforts.  Hypertension Blood pressure is elevated at 141/84 mmHg. Currently managed with carvedilol  12.5 mg and irbesartan  75 mg. Emphasis on diet, exercise, and weight loss to improve blood pressure control. - Continue carvedilol  12.5 mg and irbesartan  75 mg. - Continue diet, exercise, and weight  loss efforts.  Vitamin D  deficiency Managed with prescription ergocalciferol  50,000 IU weekly. Requests a refill. - Refilled ergocalciferol  50,000 IU weekly.      Patients who are on anti-obesity medications are counseled on the importance of maintaining healthy lifestyle habits, including balanced nutrition, regular physical activity, and behavioral modifications,  Medication is an adjunct to, not a replacement for, lifestyle changes and that the long-term success and weight maintenance depend on continued adherence to these strategies.   Laurelle was informed of the importance of frequent follow up visits to maximize her success with intensive lifestyle modifications for her obesity and obesity related health conditions as recommended by USPSTF and CMS guidelines  Louann Penton, MD   "

## 2024-05-26 NOTE — Progress Notes (Cosign Needed)
 " PCP: Shawn Sick, MD  Subjective:   HPI: Patient is a 49 y.o. female here for left foot pain.  Patient states for the past week she has had pain on the medial superior side of her foot.  She does have a history of plantar fascia but states this pain is different.  She has tried massage, as well as sleeping in a plantar fascial boot which was not helpful.  She is not taking any medications.  She denies any injury.  She notices the pain most when she is standing for prolonged period of time.  She also states she has chronic ankle swelling on the left side which has been present for some time with no known injury.  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 Dx with Autonomously functioning goiter during pregnancy. anti-TPO negative.    Heart burn    High cholesterol    Hypertension    Insomnia    Joint pain    Menorrhagia    Being tx by GYN   Obesity    Prediabetes    Sleep apnea    wears CPAP   Tachycardia    Required BB during pregnancy. Baseline 100-130 at rest   Vitamin D  deficiency     Medications Ordered Prior to Encounter[1]  BP (!) 142/78   Ht 5' 3.5 (1.613 m)   Wt 215 lb (97.5 kg)   LMP 02/02/2013   BMI 37.49 kg/m        Objective:   Physical Exam:  Gen: NAD, comfortable in exam room Left foot and ankle Inspection: Mild edema spread out through the ankle joint, no erythema or warmth.  Overpronation bilaterally. Palpation: TTP on the medial foot slightly inferior and posterior to the navicular, no tenderness with plantar fascial palpation ROM: Full dorsiflexion and plantarflexion without pain, she does have pain with flexion of the great toe, she can extend the great toe without pain Special Tests:  Negative anterior drawer, negative heel squeeze, Neuro: Neurovascular intact distally  Limited ultrasound of the left medial ankle: -Tibialis posterior with hypoechoic fluid around the tendon near the medial malleolus - Flexor hallucis longus does show hypoechoic change surrounding the tendon and increased size - In long axis, the FHL is 0.4 mm (above normal range) - Increased fluid noted around tendons at the Knot of [place] - No other cortical irregularity found      Assessment/Plan:   Joy Contreras is a 49 y.o. female who was seen today for the following: 1. Left foot pain (Primary) - US  LIMITED JOINT SPACE STRUCTURES LOW LEFT; Future - Ultrasound significant for possible inflammation of the FHL & posterior tibialis consistent with tendonitis - Discussed options with patient including steroid burst and boot - Patient was agreeable to this.  Given Rx Prednisone  taper x 6 days - fitted with short-camboot - Recommended she wear the boot when up and moving for the next 2 weeks - She can take Tylenol , or ibuprofen  to help with her pain - We will follow-up with her in 2 weeks.  May be candidate for sports insoles and/or custom orthotics in the future.  Follow-up/Education:   No follow-ups  on file.   May return sooner as needed and encouraged to call/e-mail for additional questions or  worsening symptoms in the interim.  Krystal Lowing, DO Sports Medicine Fellow 05/26/2024 1:27 PM     [1]  Current Outpatient Medications on File Prior to Visit  Medication Sig Dispense Refill   carvedilol  (COREG ) 12.5 MG tablet Take 2 tablets (25 mg total) by mouth 2 (two) times daily. 360 tablet 3   estradiol  (CLIMARA  - DOSED IN MG/24 HR) 0.1 mg/24hr patch Place 1 patch (0.1 mg total) onto the skin once a week. 12 patch 3   irbesartan  (AVAPRO ) 75 MG tablet Take 1 tablet (75 mg total) by mouth daily. 30 tablet 11   ivabradine  (CORLANOR ) 5 MG TABS tablet Take 1 tablet (5 mg total) by mouth 2  (two) times daily. 180 tablet 3   metFORMIN  (GLUCOPHAGE -XR) 500 MG 24 hr tablet Take 1 tablet (500 mg total) by mouth daily with breakfast. 30 tablet 0   nitroGLYCERIN  (NITROSTAT ) 0.4 MG SL tablet PLACE 1 TABLET (0.4 MG TOTAL) UNDER THE TONGUE EVERY 5 (FIVE) MINUTES AS NEEDED FOR CHEST PAIN. 25 tablet 3   pantoprazole  (PROTONIX ) 40 MG tablet Take 1 tablet (40 mg total) by mouth 2 (two) times daily for 10 days. 20 tablet 0   rosuvastatin  (CRESTOR ) 20 MG tablet Take 1 tablet (20 mg total) by mouth daily. 90 tablet 3   Vitamin D , Ergocalciferol , (DRISDOL ) 1.25 MG (50000 UNIT) CAPS capsule Take 1 capsule (50,000 Units total) by mouth every 7 (seven) days. 5 capsule 1   No current facility-administered medications on file prior to visit.   "

## 2024-06-01 ENCOUNTER — Other Ambulatory Visit: Payer: Self-pay

## 2024-06-03 ENCOUNTER — Ambulatory Visit: Payer: Self-pay | Admitting: Student

## 2024-06-03 ENCOUNTER — Other Ambulatory Visit: Payer: Self-pay

## 2024-06-03 MED ORDER — LISDEXAMFETAMINE DIMESYLATE 20 MG PO CAPS
20.0000 mg | ORAL_CAPSULE | Freq: Every day | ORAL | 0 refills | Status: AC
  Filled 2024-06-03: qty 30, 30d supply, fill #0

## 2024-06-03 NOTE — Progress Notes (Signed)
 Labs within normal range. Will inquire with patient whether she started Irbesartan . Will need HTN follow up per last Healthy Weight appointment with SBP ~141

## 2024-06-04 ENCOUNTER — Other Ambulatory Visit: Payer: Self-pay

## 2024-06-04 ENCOUNTER — Encounter: Payer: Self-pay | Admitting: Family Medicine

## 2024-06-04 MED ORDER — PREDNISONE 10 MG PO TABS
ORAL_TABLET | ORAL | 0 refills | Status: AC
  Filled 2024-06-04: qty 48, 12d supply, fill #0

## 2024-06-05 ENCOUNTER — Other Ambulatory Visit: Payer: Self-pay

## 2024-06-11 LAB — HM MAMMOGRAPHY

## 2024-06-23 ENCOUNTER — Ambulatory Visit (INDEPENDENT_AMBULATORY_CARE_PROVIDER_SITE_OTHER): Admitting: Nurse Practitioner
